# Patient Record
Sex: Female | Born: 1950 | Race: White | Hispanic: No | Smoking: Current every day smoker
Health system: Southern US, Community
[De-identification: ages and names within clinical notes are randomized; demographics above are authoritative.]

## PROBLEM LIST (undated history)

## (undated) DIAGNOSIS — E119 Type 2 diabetes mellitus without complications: Secondary | ICD-10-CM

## (undated) DIAGNOSIS — J189 Pneumonia, unspecified organism: Secondary | ICD-10-CM

## (undated) DIAGNOSIS — O2230 Deep phlebothrombosis in pregnancy, unspecified trimester: Secondary | ICD-10-CM

## (undated) DIAGNOSIS — K5730 Diverticulosis of large intestine without perforation or abscess without bleeding: Secondary | ICD-10-CM

## (undated) DIAGNOSIS — J449 Chronic obstructive pulmonary disease, unspecified: Secondary | ICD-10-CM

## (undated) HISTORY — PX: ABDOMINAL HYSTERECTOMY: SHX81

## (undated) HISTORY — PX: TONSILLECTOMY: SUR1361

## (undated) HISTORY — PX: CYSTECTOMY: SUR359

---

## 2004-10-09 ENCOUNTER — Ambulatory Visit: Payer: Self-pay | Admitting: Obstetrics and Gynecology

## 2007-03-24 ENCOUNTER — Ambulatory Visit: Payer: Self-pay | Admitting: Family Medicine

## 2007-10-05 ENCOUNTER — Ambulatory Visit: Payer: Self-pay | Admitting: Family Medicine

## 2008-05-19 ENCOUNTER — Inpatient Hospital Stay: Payer: Self-pay | Admitting: Internal Medicine

## 2008-08-21 ENCOUNTER — Ambulatory Visit: Payer: Self-pay | Admitting: Family Medicine

## 2009-12-29 ENCOUNTER — Ambulatory Visit: Payer: Self-pay | Admitting: Family Medicine

## 2010-03-19 ENCOUNTER — Ambulatory Visit: Payer: Self-pay | Admitting: Family Medicine

## 2010-10-07 ENCOUNTER — Ambulatory Visit: Payer: Self-pay | Admitting: Family Medicine

## 2010-11-20 ENCOUNTER — Emergency Department: Payer: Self-pay | Admitting: Emergency Medicine

## 2010-11-27 ENCOUNTER — Ambulatory Visit: Payer: Self-pay | Admitting: Family Medicine

## 2011-05-17 ENCOUNTER — Ambulatory Visit: Payer: Self-pay | Admitting: Family Medicine

## 2011-06-30 ENCOUNTER — Ambulatory Visit: Payer: Self-pay | Admitting: Family Medicine

## 2012-01-10 ENCOUNTER — Ambulatory Visit: Payer: Self-pay | Admitting: Specialist

## 2012-01-10 LAB — CREATININE, SERUM
Creatinine: 0.81 mg/dL (ref 0.60–1.30)
EGFR (Non-African Amer.): >60

## 2012-01-18 ENCOUNTER — Ambulatory Visit: Payer: Self-pay | Admitting: Specialist

## 2012-02-09 ENCOUNTER — Ambulatory Visit: Payer: Self-pay | Admitting: Hematology and Oncology

## 2012-03-01 ENCOUNTER — Ambulatory Visit: Payer: Self-pay | Admitting: Hematology and Oncology

## 2012-07-18 ENCOUNTER — Ambulatory Visit: Payer: Self-pay | Admitting: Specialist

## 2012-07-18 LAB — CREATININE, SERUM: EGFR (Non-African Amer.): >60

## 2013-01-29 ENCOUNTER — Emergency Department: Payer: Self-pay | Admitting: Emergency Medicine

## 2013-01-29 LAB — CBC
MCHC: 34.5 g/dL (ref 32.0–36.0)
Platelet: 239 10*3/uL (ref 150–440)
RBC: 4.33 10*6/uL (ref 3.80–5.20)

## 2013-01-29 LAB — COMPREHENSIVE METABOLIC PANEL
Alkaline Phosphatase: 84 U/L
Anion Gap: 5 — ABNORMAL LOW (ref 7–16)
BUN: 14 mg/dL (ref 7–18)
Bilirubin,Total: 0.4 mg/dL (ref 0.2–1.0)
Calcium, Total: 8.8 mg/dL (ref 8.5–10.1)
Co2: 31 mmol/L (ref 21–32)
EGFR (African American): >60
EGFR (Non-African Amer.): >60
Glucose: 102 mg/dL — ABNORMAL HIGH (ref 65–99)
Osmolality: 274 (ref 275–301)
SGOT(AST): 34 U/L (ref 15–37)
SGPT (ALT): 14 U/L (ref 12–78)
Total Protein: 6.9 g/dL (ref 6.4–8.2)

## 2013-01-29 LAB — DRUG SCREEN, URINE
Amphetamines, Ur Screen: NEGATIVE (ref ?–1000)
Benzodiazepine, Ur Scrn: POSITIVE (ref ?–200)
Cocaine Metabolite,Ur ~~LOC~~: NEGATIVE (ref ?–300)
Opiate, Ur Screen: NEGATIVE (ref ?–300)
Phencyclidine (PCP) Ur S: NEGATIVE (ref ?–25)
Tricyclic, Ur Screen: NEGATIVE (ref ?–1000)

## 2013-01-29 LAB — URINALYSIS, COMPLETE
Bilirubin,UR: NEGATIVE
Glucose,UR: NEGATIVE mg/dL (ref 0–75)
Ketone: NEGATIVE
Leukocyte Esterase: NEGATIVE
Ph: 5 (ref 4.5–8.0)
Protein: NEGATIVE
RBC,UR: <1 /HPF (ref 0–5)
Specific Gravity: 1.009 (ref 1.003–1.030)
WBC UR: <1 /HPF (ref 0–5)

## 2013-01-29 LAB — ETHANOL
Ethanol %: <0.003 % (ref 0.000–0.080)
Ethanol: <3 mg/dL

## 2013-01-29 LAB — WET PREP, GENITAL

## 2013-01-29 LAB — SALICYLATE LEVEL: Salicylates, Serum: 3.1 mg/dL — ABNORMAL HIGH

## 2013-01-29 LAB — GC/CHLAMYDIA PROBE AMP

## 2013-02-11 ENCOUNTER — Inpatient Hospital Stay: Payer: Self-pay | Admitting: Psychiatry

## 2013-02-11 LAB — COMPREHENSIVE METABOLIC PANEL
Anion Gap: 6 — ABNORMAL LOW (ref 7–16)
Chloride: 104 mmol/L (ref 98–107)
Co2: 29 mmol/L (ref 21–32)
EGFR (African American): >60
Osmolality: 278 (ref 275–301)
SGOT(AST): 21 U/L (ref 15–37)
Sodium: 139 mmol/L (ref 136–145)
Total Protein: 7.2 g/dL (ref 6.4–8.2)

## 2013-02-11 LAB — URINALYSIS, COMPLETE
Bilirubin,UR: NEGATIVE
Nitrite: NEGATIVE
Ph: 5 (ref 4.5–8.0)
Specific Gravity: 1.011 (ref 1.003–1.030)
Squamous Epithelial: 1

## 2013-02-11 LAB — CBC
HGB: 15.8 g/dL (ref 12.0–16.0)
MCH: 33.3 pg (ref 26.0–34.0)
MCHC: 34.2 g/dL (ref 32.0–36.0)
MCV: 97 fL (ref 80–100)
Platelet: 226 10*3/uL (ref 150–440)
WBC: 7.1 10*3/uL (ref 3.6–11.0)

## 2013-02-11 LAB — DRUG SCREEN, URINE
Barbiturates, Ur Screen: NEGATIVE (ref ?–200)
Benzodiazepine, Ur Scrn: POSITIVE (ref ?–200)
Cocaine Metabolite,Ur ~~LOC~~: NEGATIVE (ref ?–300)
MDMA (Ecstasy)Ur Screen: NEGATIVE (ref ?–500)
Methadone, Ur Screen: NEGATIVE (ref ?–300)
Opiate, Ur Screen: NEGATIVE (ref ?–300)
Tricyclic, Ur Screen: NEGATIVE (ref ?–1000)

## 2013-02-11 LAB — ETHANOL: Ethanol: <3 mg/dL

## 2013-02-11 LAB — ACETAMINOPHEN LEVEL: Acetaminophen: <2 ug/mL

## 2013-02-11 LAB — SALICYLATE LEVEL: Salicylates, Serum: 3.6 mg/dL — ABNORMAL HIGH

## 2013-05-31 ENCOUNTER — Ambulatory Visit: Payer: Self-pay | Admitting: Obstetrics and Gynecology

## 2013-05-31 ENCOUNTER — Ambulatory Visit: Payer: Self-pay | Admitting: Specialist

## 2013-11-16 IMAGING — CT CT CHEST W/ CM
1 series · 15 of 33 positions shown, 19 images · IV contrast (isovue)
Comparison: none

REASON FOR EXAM: LABS 1ST Nodules mediastinal nodes
COMMENTS:

PROCEDURE:     CAROLLE-TATJANA - CAROLLE-TATJANA CHEST WITH CONTRAST  - July 18, 2012 [DATE]
RESULT:     Comparison: CT of the chest 01/10/2012, PET CT 01/18/2012
TECHNIQUE: Multiple axial images of the chest were obtained with 75 mL
Isovue 370 intravenous contrast.

[Series 2: soft tissue · axial · 0.59mm/px · z∈[-789,-513]mm · 15 of 109 slices shown, 19 images]
[im 9/109  mediastinal]
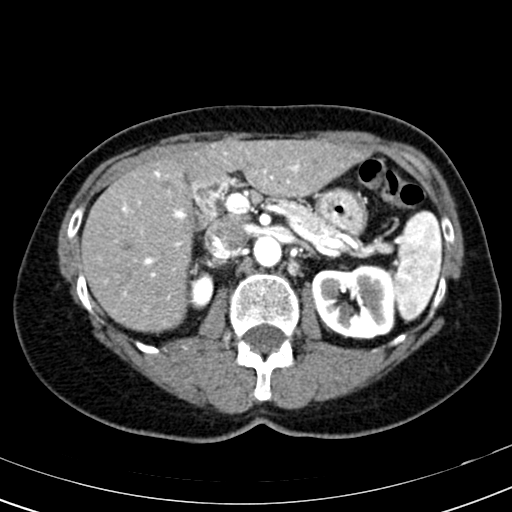
[im 9/109  lung]
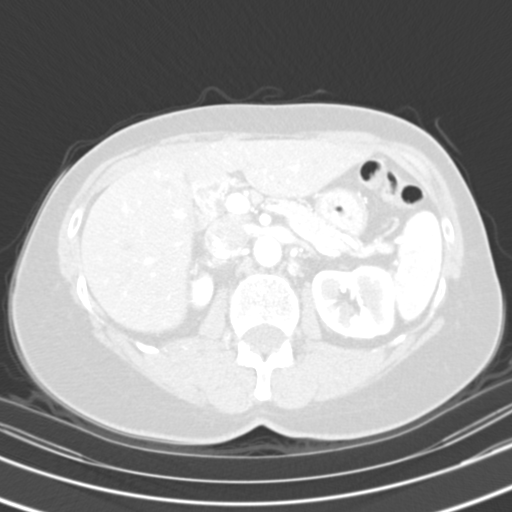
[im 17/109  lung]
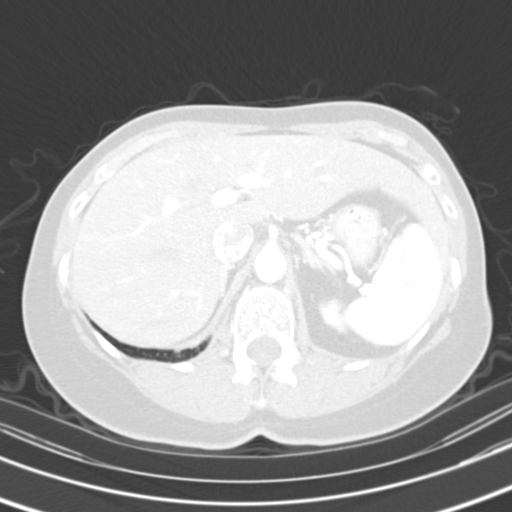
[im 22/109  lung]
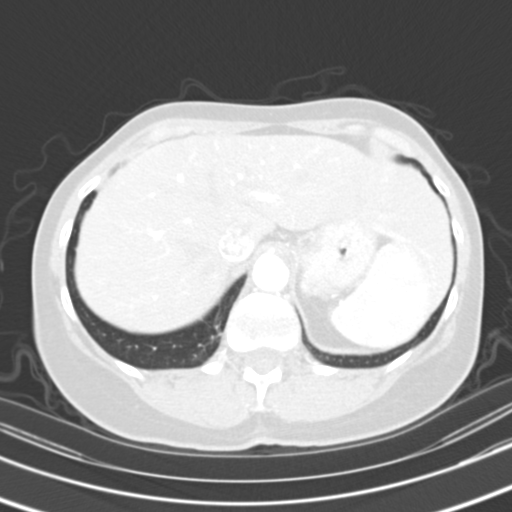
[im 29/109  lung]
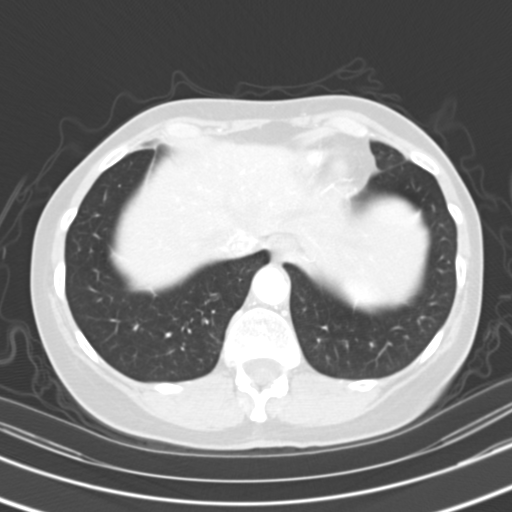
[im 37/109  mediastinal]
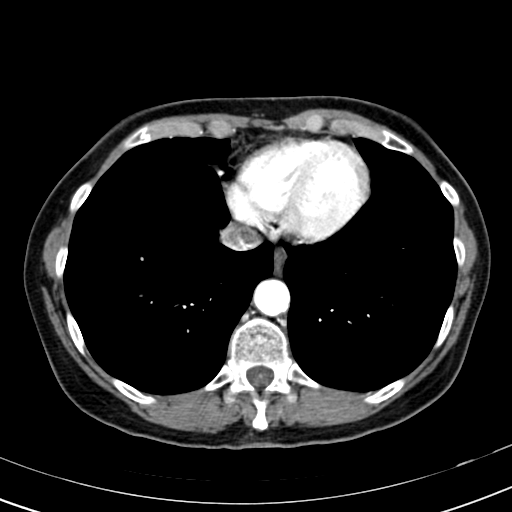
[im 37/109  lung]
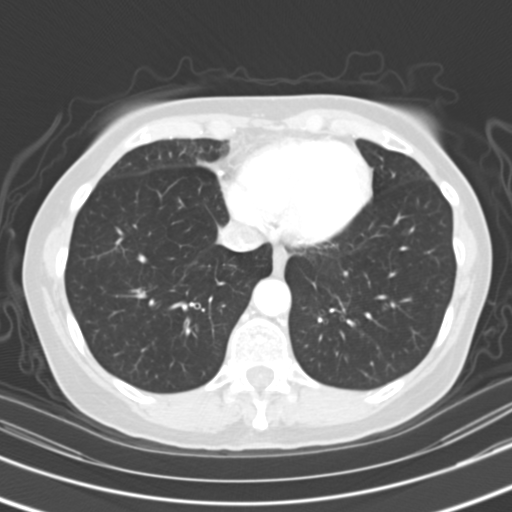
[im 44/109  lung]
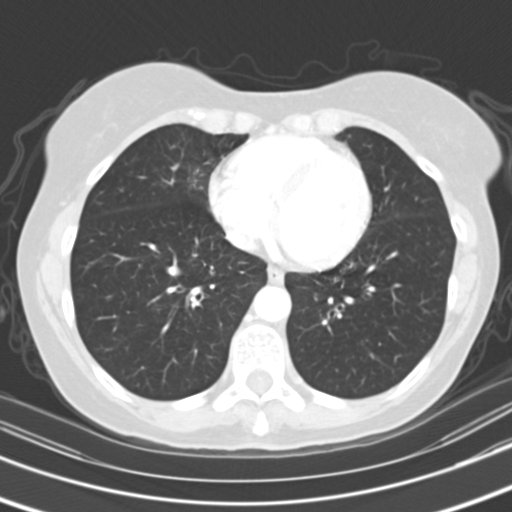
[im 49/109  lung]
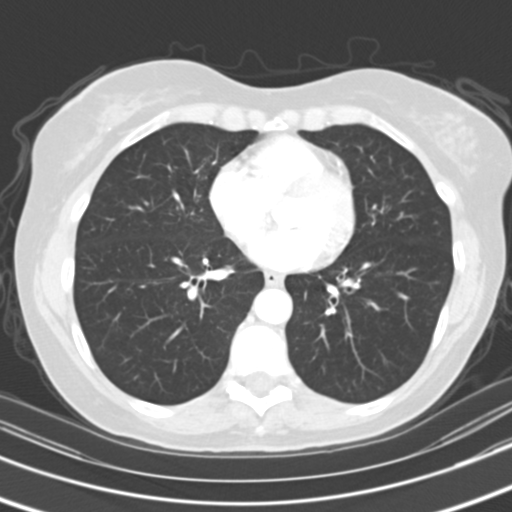
[im 53/109  lung]
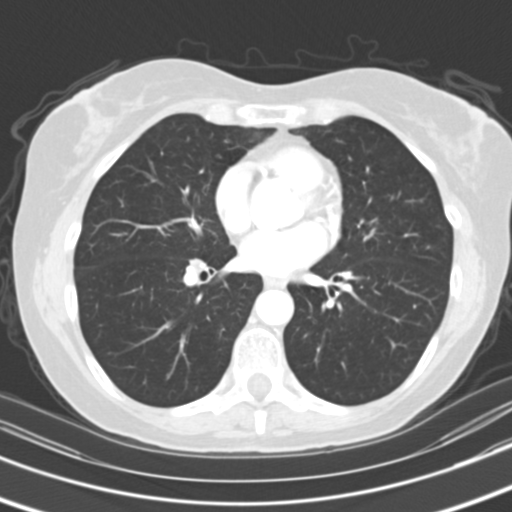
[im 58/109  mediastinal]
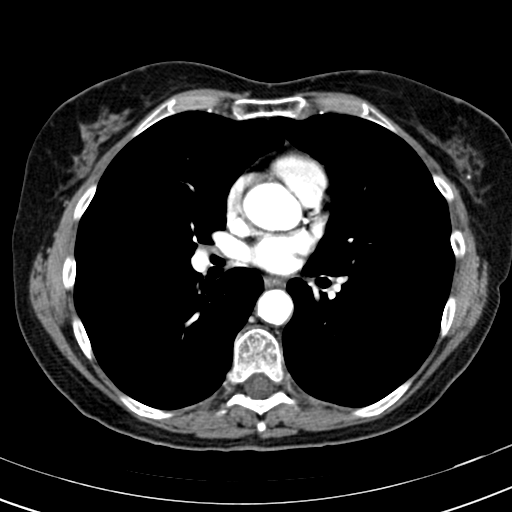
[im 58/109  lung]
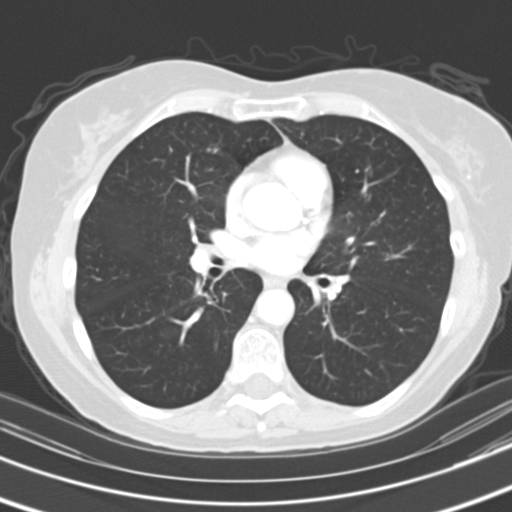
[im 65/109  lung]
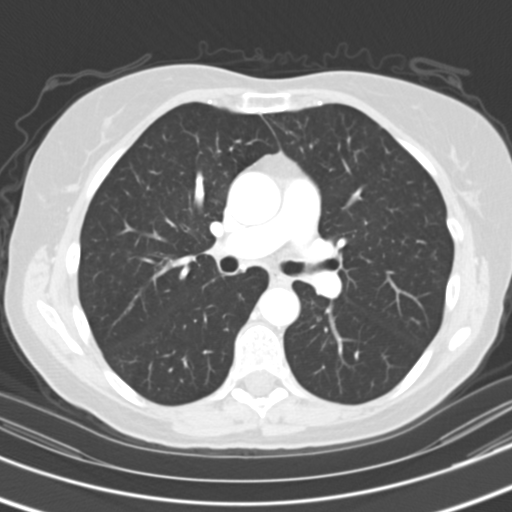
[im 73/109  lung]
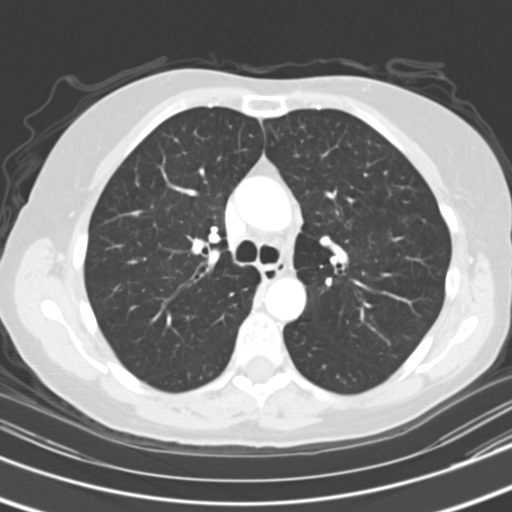
[im 81/109  lung]
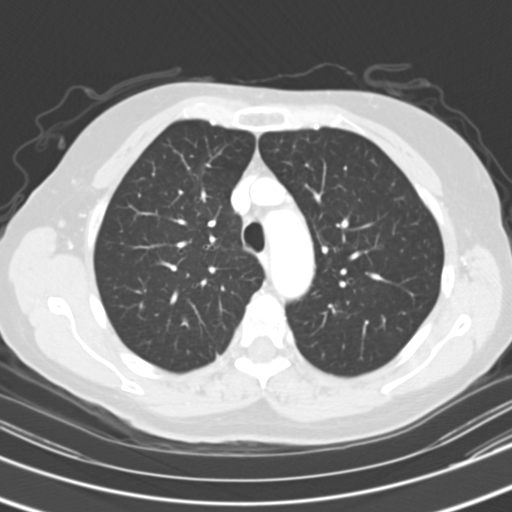
[im 87/109  mediastinal]
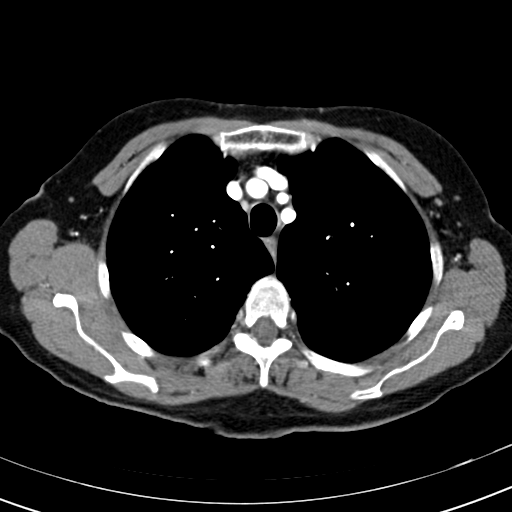
[im 87/109  lung]
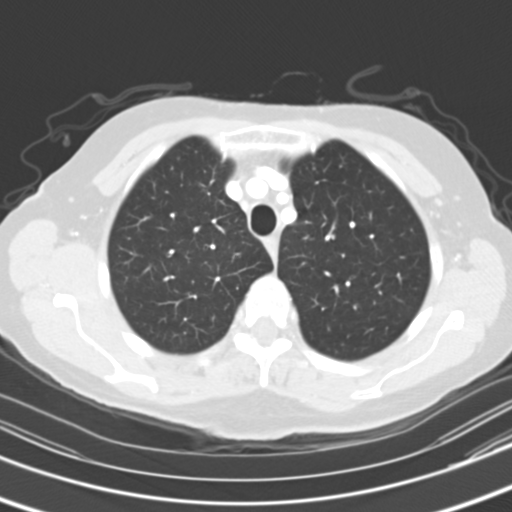
[im 93/109  lung]
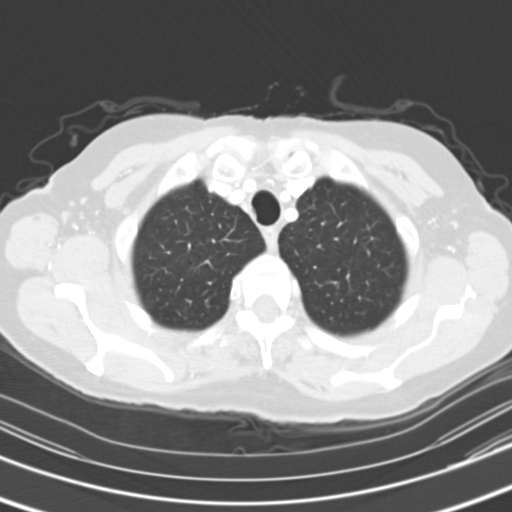
[im 101/109  lung]
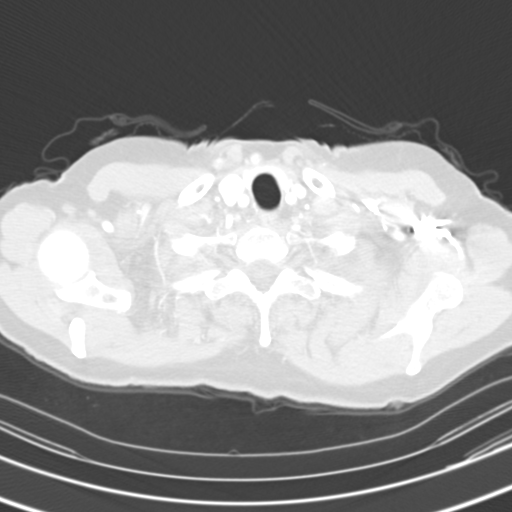

[15 of 33 positions shown; findings below may reference images not displayed]

FINDINGS: Borderline enlarged right paratracheal lymph node near the thoracic inlet is
similar to slightly decreased in conspicuity from prior. It measures
approximately 10-11 mm in diameter. No hilar or axillary lymphadenopathy.
Calcifications are seen in the coronary arteries. There is a 5 mm subpleural
groundglass nodule in the right upper lobe. This appears slightly more
conspicuous than prior, though this is likely related to differences in
slice selection. This is seen on image 30. A 3 mm nodule in the left lower
lobe is similar to prior, image 73.

No aggressive lytic or sclerotic osseous lesions are identified.
IMPRESSION: 1. Borderline enlarged right paratracheal lymph node is similar to slightly
decreased in conspicuity from prior.
2. Indeterminate 5 mm subpleural groundglass nodule in the right upper lobe
is slightly increased in conspicuity from prior, though this is likely
related to differences in slice selection. 3 mm nodule in the left lower
lobe is similar to prior.

Followup noncontrast chest CT is suggested in 6 months for the above.

## 2014-01-13 ENCOUNTER — Emergency Department: Payer: Self-pay | Admitting: Emergency Medicine

## 2014-06-20 ENCOUNTER — Emergency Department
Admit: 2014-06-20 | Disposition: A | Discharge status: Home / Self Care | Payer: Self-pay | Admitting: Emergency Medicine
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
## 2015-04-12 ENCOUNTER — Emergency Department: Payer: Medicare HMO

## 2015-04-12 ENCOUNTER — Emergency Department
Admission: EM | Admit: 2015-04-12 | Discharge: 2015-04-12 | Disposition: A | Discharge status: Home / Self Care | Payer: Medicare HMO | Attending: Emergency Medicine | Admitting: Emergency Medicine

## 2015-04-12 ENCOUNTER — Encounter: Payer: Self-pay | Admitting: Emergency Medicine

## 2015-04-12 DIAGNOSIS — R1030 Lower abdominal pain, unspecified: Secondary | ICD-10-CM | POA: Diagnosis present

## 2015-04-12 DIAGNOSIS — K5732 Diverticulitis of large intestine without perforation or abscess without bleeding: Secondary | ICD-10-CM | POA: Insufficient documentation

## 2015-04-12 DIAGNOSIS — F17200 Nicotine dependence, unspecified, uncomplicated: Secondary | ICD-10-CM | POA: Insufficient documentation

## 2015-04-12 HISTORY — DX: Diverticulosis of large intestine without perforation or abscess without bleeding: K57.30

## 2015-04-12 HISTORY — DX: Chronic obstructive pulmonary disease, unspecified: J44.9

## 2015-04-12 LAB — URINALYSIS COMPLETE WITH MICROSCOPIC (ARMC ONLY)
Bilirubin Urine: NEGATIVE
GLUCOSE, UA: NEGATIVE mg/dL
Ketones, ur: NEGATIVE mg/dL
LEUKOCYTES UA: NEGATIVE
NITRITE: NEGATIVE
PH: 5 (ref 5.0–8.0)
PROTEIN: NEGATIVE mg/dL
SPECIFIC GRAVITY, URINE: 1.008 (ref 1.005–1.030)
WBC UA: NONE SEEN WBC/hpf (ref 0–5)

## 2015-04-12 LAB — COMPREHENSIVE METABOLIC PANEL
ALT: 11 U/L — ABNORMAL LOW (ref 14–54)
ANION GAP: 9 (ref 5–15)
AST: 19 U/L (ref 15–41)
Albumin: 3.7 g/dL (ref 3.5–5.0)
Alkaline Phosphatase: 101 U/L (ref 38–126)
BUN: 9 mg/dL (ref 6–20)
CO2: 27 mmol/L (ref 22–32)
Calcium: 8.8 mg/dL — ABNORMAL LOW (ref 8.9–10.3)
Chloride: 100 mmol/L — ABNORMAL LOW (ref 101–111)
Creatinine, Ser: 0.9 mg/dL (ref 0.44–1.00)
GFR calc Af Amer: >60 mL/min (ref >60)
GFR calc non Af Amer: >60 mL/min (ref >60)
Glucose, Bld: 127 mg/dL — ABNORMAL HIGH (ref 65–99)
POTASSIUM: 4.3 mmol/L (ref 3.5–5.1)
Sodium: 136 mmol/L (ref 135–145)
Total Bilirubin: 0.5 mg/dL (ref 0.3–1.2)
Total Protein: 7.4 g/dL (ref 6.5–8.1)

## 2015-04-12 LAB — CBC WITH DIFFERENTIAL/PLATELET
BASOS ABS: 0.1 10*3/uL (ref 0–0.1)
Basophils Relative: 1 %
Eosinophils Absolute: 0 10*3/uL (ref 0–0.7)
Eosinophils Relative: 1 %
HEMATOCRIT: 45.3 % (ref 35.0–47.0)
Hemoglobin: 15.5 g/dL (ref 12.0–16.0)
LYMPHS ABS: 1.3 10*3/uL (ref 1.0–3.6)
LYMPHS PCT: 15 %
MCH: 33.2 pg (ref 26.0–34.0)
MCHC: 34.2 g/dL (ref 32.0–36.0)
MCV: 97 fL (ref 80.0–100.0)
MONO ABS: 0.5 10*3/uL (ref 0.2–0.9)
Monocytes Relative: 6 %
NEUTROS ABS: 6.8 10*3/uL — AB (ref 1.4–6.5)
Neutrophils Relative %: 77 %
Platelets: 197 10*3/uL (ref 150–440)
RBC: 4.67 MIL/uL (ref 3.80–5.20)
RDW: 12.9 % (ref 11.5–14.5)
WBC: 8.7 10*3/uL (ref 3.6–11.0)

## 2015-04-12 LAB — LIPASE, BLOOD: Lipase: 27 U/L (ref 11–51)

## 2015-04-12 MED ORDER — CIPROFLOXACIN HCL 500 MG PO TABS
500.0000 mg | ORAL_TABLET | Freq: Once | ORAL | Status: AC
  Administered 2015-04-12: 500 mg via ORAL
  Filled 2015-04-12: qty 1

## 2015-04-12 MED ORDER — ONDANSETRON HCL 4 MG/2ML IJ SOLN
4.0000 mg | Freq: Once | INTRAMUSCULAR | Status: AC
  Administered 2015-04-12: 4 mg via INTRAVENOUS
  Filled 2015-04-12: qty 2

## 2015-04-12 MED ORDER — OXYCODONE-ACETAMINOPHEN 5-325 MG PO TABS: 1.0000 | ORAL_TABLET | Freq: Four times a day (QID) | ORAL | Status: DC | PRN

## 2015-04-12 MED ORDER — IOHEXOL 300 MG/ML  SOLN
75.0000 mL | Freq: Once | INTRAMUSCULAR | Status: AC | PRN
  Administered 2015-04-12: 75 mL via INTRAVENOUS

## 2015-04-12 MED ORDER — IOHEXOL 240 MG/ML SOLN
25.0000 mL | Freq: Once | INTRAMUSCULAR | Status: AC | PRN
  Administered 2015-04-12: 25 mL via ORAL

## 2015-04-12 MED ORDER — METRONIDAZOLE 500 MG PO TABS: 500.0000 mg | ORAL_TABLET | Freq: Three times a day (TID) | ORAL | Status: AC

## 2015-04-12 MED ORDER — MORPHINE SULFATE (PF) 4 MG/ML IV SOLN
4.0000 mg | Freq: Once | INTRAVENOUS | Status: AC
  Administered 2015-04-12: 4 mg via INTRAVENOUS
  Filled 2015-04-12: qty 1

## 2015-04-12 MED ORDER — METRONIDAZOLE 500 MG PO TABS
500.0000 mg | ORAL_TABLET | Freq: Once | ORAL | Status: AC
  Administered 2015-04-12: 500 mg via ORAL
  Filled 2015-04-12: qty 1

## 2015-04-12 MED ORDER — SODIUM CHLORIDE 0.9 % IV BOLUS (SEPSIS)
1000.0000 mL | Freq: Once | INTRAVENOUS | Status: AC
Start: 2015-04-12 — End: 2015-04-12
  Administered 2015-04-12: 1000 mL via INTRAVENOUS

## 2015-04-12 MED ORDER — CIPROFLOXACIN HCL 500 MG PO TABS: 500.0000 mg | ORAL_TABLET | Freq: Two times a day (BID) | ORAL | Status: AC

## 2015-04-12 NOTE — ED Notes (Signed)
Esignature not working. Unable to obtain signature. Pt given written and verbal discharge instructions. Verbalized understanding.

## 2015-04-12 NOTE — ED Notes (Signed)
Pt transported to CT scan.

## 2015-04-12 NOTE — ED Provider Notes (Signed)
[facility] Emergency Department Provider Note  Time seen: 8:02 AM  I have reviewed the triage vital signs and the nursing notes.   HISTORY  Chief Complaint Abdominal Pain    HPI Yvette Martinez is a 65 y.o. female with a past medical history of diverticulitis in the past, COPD, who presents the emergency department with abdominal pain. According to the patient for the past 6-7 days she has not had a bowel movement, has had progressively increasing lower abdominal pain and left lower quadrant pain. States it feels like her past episode of diverticulitis. Patient denies any diarrhea, black or bloody stool. Denies any nausea or vomiting or fever. Describes the pain as moderate to severe, mostly located in the lower abdomen to left lower quadrant. Does state this morning she began having burning with urination as well. States the abdominal pain as dull/aching, but will occasionally be sharp and severe.     Past Medical History  Diagnosis Date  . Diverticula of colon   . COPD (chronic obstructive pulmonary disease) (HCC)     There are no active problems to display for this patient.   History reviewed. No pertinent past surgical history.  No current outpatient prescriptions on file.  Allergies Sulfa antibiotics  History reviewed. No pertinent family history.  Social History Social History  Substance Use Topics  . Smoking status: Current Every Day Smoker  . Smokeless tobacco: None  . Alcohol Use: None    Review of Systems Constitutional: Negative for fever. Cardiovascular: Negative for chest pain. Respiratory: Negative for shortness of breath. Gastrointestinal: Lower and left lower quadrant abdominal pain. Negative for nausea, vomiting, diarrhea. Positive constipation. Genitourinary: Positive for dysuria beginning today. Neurological: Negative for headache 10-point ROS otherwise  negative.  ____________________________________________   PHYSICAL EXAM:  VITAL SIGNS: ED Triage Vitals  Enc Vitals Group     BP 04/12/15 0718 138/76 mmHg     Pulse Rate 04/12/15 0718 109     Resp 04/12/15 0718 20     Temp 04/12/15 0718 97.8 F (36.6 C)     Temp Source 04/12/15 0718 Oral     SpO2 04/12/15 0718 92 %     Weight 04/12/15 0718 136 lb (61.689 kg)     Height 04/12/15 0718  (1.575 m)     Head Cir --      Peak Flow --      Pain Score 04/12/15 0718 10     Pain Loc --      Pain Edu? --      Excl. in GC? --     Constitutional: Alert and oriented. Well appearing and in no distress. Eyes: Normal exam ENT   Head: Normocephalic and atraumatic.   Mouth/Throat: Mucous membranes are moist. Cardiovascular: Normal rate, regular rhythm. No murmurs, rubs, or gallops. Respiratory: Normal respiratory effort without tachypnea nor retractions. Breath sounds are clear  Gastrointestinal: Soft, moderate suprapubic and left lower quadrant tenderness to palpation. No rebound or guarding. No distention. Musculoskeletal: Nontender with normal range of motion in all extremities.  Neurologic:  Normal speech and language. No gross focal neurologic deficits Skin:  Skin is warm, dry and intact.  Psychiatric: Mood and affect are normal. Speech and behavior are normal.  ____________________________________________   RADIOLOGY  CT consistent with sigmoid diverticulitis.  ____________________________________________   INITIAL IMPRESSION / ASSESSMENT AND PLAN / ED COURSE  Pertinent labs & imaging results that were available during my care of the patient were reviewed by me and  considered in my medical decision making (see chart for details).  Patient presents to the emergency department with lower abdominal pain and left lower quadrant pain which has been progressively worsening over the past 6-7 days. Patient also states constipation for the same time period. Patient's labs  including CBC, lipase, CMP are within normal limits. Currently awaiting urinalysis results. Given the patient's moderate tenderness to palpation in the left lower quadrant and lower abdomen along with constipation 6 days we will proceed with a CT abdomen/pelvis to further evaluate concern for diverticulitis vs SBO.  CT consistent with sigmoid diverticulitis. Patient states her pain is much better at this time. We'll place the patient on Flagyl and ciprofloxacin, pain medication as needed with primary care follow-up. Patient agreeable to plan. Discussed return precautions.  ____________________________________________   FINAL CLINICAL IMPRESSION(S) / ED DIAGNOSES  Abdominal pain Sigmoid diverticulitis  Minna Antis, MD 04/12/15 1046

## 2015-04-12 NOTE — ED Notes (Signed)
Reports abd pain all week.  Unable to have a BM.  Nausea.

## 2015-04-12 NOTE — Discharge Instructions (Signed)
Diverticulitis  Diverticulitis is when small pockets that have formed in your colon (large intestine) become infected or swollen.  HOME CARE  · Follow your doctor's instructions.  · Follow a special diet if told by your doctor.  · When you feel better, your doctor may tell you to change your diet. You may be told to eat a lot of fiber. Fruits and vegetables are good sources of fiber. Fiber makes it easier to poop (have bowel movements).  · Take supplements or probiotics as told by your doctor.  · Only take medicines as told by your doctor.  · Keep all follow-up visits with your doctor.  GET HELP IF:  · Your pain does not get better.  · You have a hard time eating food.  · You are not pooping like normal.  GET HELP RIGHT AWAY IF:  · Your pain gets worse.  · Your problems do not get better.  · Your problems suddenly get worse.  · You have a fever.  · You keep throwing up (vomiting).  · You have bloody or black, tarry poop (stool).  MAKE SURE YOU:   · Understand these instructions.  · Will watch your condition.  · Will get help right away if you are not doing well or get worse.     This information is not intended to replace advice given to you by your health care provider. Make sure you discuss any questions you have with your health care provider.     Document Released: 08/04/2007 Document Revised: 02/20/2013 Document Reviewed: 01/10/2013  Elsevier Interactive Patient Education ©2016 [employer]

## 2015-04-12 NOTE — ED Notes (Signed)
First IV attempt unsuccessful, pt requested ac placement

## 2015-04-17 ENCOUNTER — Emergency Department: Payer: Medicare HMO

## 2015-04-17 ENCOUNTER — Emergency Department
Admission: EM | Admit: 2015-04-17 | Discharge: 2015-04-17 | Disposition: A | Discharge status: Home / Self Care | Payer: Medicare HMO | Attending: Emergency Medicine | Admitting: Emergency Medicine

## 2015-04-17 ENCOUNTER — Encounter: Payer: Self-pay | Admitting: Emergency Medicine

## 2015-04-17 DIAGNOSIS — R1032 Left lower quadrant pain: Secondary | ICD-10-CM | POA: Diagnosis present

## 2015-04-17 DIAGNOSIS — F17200 Nicotine dependence, unspecified, uncomplicated: Secondary | ICD-10-CM | POA: Insufficient documentation

## 2015-04-17 DIAGNOSIS — K5732 Diverticulitis of large intestine without perforation or abscess without bleeding: Secondary | ICD-10-CM

## 2015-04-17 LAB — CBC WITH DIFFERENTIAL/PLATELET
BASOS ABS: 0.1 10*3/uL (ref 0–0.1)
BASOS PCT: 1 %
EOS ABS: 0 10*3/uL (ref 0–0.7)
EOS PCT: 0 %
HCT: 45.8 % (ref 35.0–47.0)
Hemoglobin: 15.9 g/dL (ref 12.0–16.0)
LYMPHS ABS: 1.2 10*3/uL (ref 1.0–3.6)
Lymphocytes Relative: 11 %
MCH: 33.6 pg (ref 26.0–34.0)
MCHC: 34.6 g/dL (ref 32.0–36.0)
MCV: 97 fL (ref 80.0–100.0)
Monocytes Absolute: 0.5 10*3/uL (ref 0.2–0.9)
Monocytes Relative: 5 %
Neutro Abs: 9 10*3/uL — ABNORMAL HIGH (ref 1.4–6.5)
Neutrophils Relative %: 83 %
PLATELETS: 257 10*3/uL (ref 150–440)
RBC: 4.73 MIL/uL (ref 3.80–5.20)
RDW: 13.3 % (ref 11.5–14.5)
WBC: 10.8 10*3/uL (ref 3.6–11.0)

## 2015-04-17 LAB — URINALYSIS COMPLETE WITH MICROSCOPIC (ARMC ONLY)
BACTERIA UA: NONE SEEN
Glucose, UA: NEGATIVE mg/dL
HGB URINE DIPSTICK: NEGATIVE
Nitrite: NEGATIVE
PH: 5 (ref 5.0–8.0)
PROTEIN: 30 mg/dL — AB
SPECIFIC GRAVITY, URINE: 1.031 — AB (ref 1.005–1.030)

## 2015-04-17 LAB — COMPREHENSIVE METABOLIC PANEL
ALT: 15 U/L (ref 14–54)
AST: 31 U/L (ref 15–41)
Albumin: 3.8 g/dL (ref 3.5–5.0)
Alkaline Phosphatase: 79 U/L (ref 38–126)
Anion gap: 7 (ref 5–15)
BILIRUBIN TOTAL: 0.5 mg/dL (ref 0.3–1.2)
BUN: 14 mg/dL (ref 6–20)
CHLORIDE: 103 mmol/L (ref 101–111)
CO2: 28 mmol/L (ref 22–32)
CREATININE: 1.11 mg/dL — AB (ref 0.44–1.00)
Calcium: 8.9 mg/dL (ref 8.9–10.3)
GFR, EST AFRICAN AMERICAN: 59 mL/min — AB (ref >60)
GFR, EST NON AFRICAN AMERICAN: 51 mL/min — AB (ref >60)
GLUCOSE: 142 mg/dL — AB (ref 65–99)
POTASSIUM: 4.3 mmol/L (ref 3.5–5.1)
Sodium: 138 mmol/L (ref 135–145)
Total Protein: 7.3 g/dL (ref 6.5–8.1)

## 2015-04-17 LAB — LIPASE, BLOOD: Lipase: 27 U/L (ref 11–51)

## 2015-04-17 MED ORDER — ACETAMINOPHEN 500 MG PO TABS
1000.0000 mg | ORAL_TABLET | ORAL | Status: AC
  Administered 2015-04-17: 1000 mg via ORAL
  Filled 2015-04-17: qty 2

## 2015-04-17 MED ORDER — METRONIDAZOLE 500 MG PO TABS: 500.0000 mg | ORAL_TABLET | Freq: Three times a day (TID) | ORAL | Status: DC

## 2015-04-17 MED ORDER — CIPROFLOXACIN HCL 500 MG PO TABS: 500.0000 mg | ORAL_TABLET | Freq: Two times a day (BID) | ORAL | Status: AC

## 2015-04-17 MED ORDER — ONDANSETRON HCL 4 MG/2ML IJ SOLN
4.0000 mg | Freq: Once | INTRAMUSCULAR | Status: AC
  Administered 2015-04-17: 4 mg via INTRAVENOUS
  Filled 2015-04-17: qty 2

## 2015-04-17 MED ORDER — BISACODYL 10 MG RE SUPP: 10.0000 mg | RECTAL | Status: DC | PRN

## 2015-04-17 MED ORDER — POLYETHYLENE GLYCOL 3350 17 G PO PACK
8.5000 g | PACK | Freq: Every day | ORAL | Status: DC
Start: 2015-04-17 — End: 2015-08-23

## 2015-04-17 MED ORDER — IOHEXOL 300 MG/ML  SOLN
75.0000 mL | Freq: Once | INTRAMUSCULAR | Status: AC | PRN
  Administered 2015-04-17: 75 mL via INTRAVENOUS

## 2015-04-17 MED ORDER — SODIUM CHLORIDE 0.9 % IV BOLUS (SEPSIS)
1000.0000 mL | Freq: Once | INTRAVENOUS | Status: AC
  Administered 2015-04-17: 1000 mL via INTRAVENOUS

## 2015-04-17 MED ORDER — IOHEXOL 240 MG/ML SOLN
25.0000 mL | Freq: Once | INTRAMUSCULAR | Status: DC | PRN
  Administered 2015-04-17: 25 mL via ORAL
  Filled 2015-04-17: qty 50

## 2015-04-17 NOTE — ED Notes (Signed)
Pt states she was seen here Saturday for diverticulitis and bowel blockage, given prescription for flagyl and cipro. No BM since 04/05/15, did suppository last night. Taken mineral oil, milk of magnesia. States pee is brown.

## 2015-04-17 NOTE — Discharge Instructions (Signed)
Diverticulitis °Diverticulitis is inflammation or infection of small pouches in your colon that form when you have a condition called diverticulosis. The pouches in your colon are called diverticula. Your colon, or large intestine, is where water is absorbed and stool is formed. °Complications of diverticulitis can include: °· Bleeding. °· Severe infection. °· Severe pain. °· Perforation of your colon. °· Obstruction of your colon. °CAUSES  °Diverticulitis is caused by bacteria. °Diverticulitis happens when stool becomes trapped in diverticula. This allows bacteria to grow in the diverticula, which can lead to inflammation and infection. °RISK FACTORS °People with diverticulosis are at risk for diverticulitis. Eating a diet that does not include enough fiber from fruits and vegetables may make diverticulitis more likely to develop. °SYMPTOMS  °Symptoms of diverticulitis may include: °· Abdominal pain and tenderness. The pain is normally located on the left side of the abdomen, but may occur in other areas. °· Fever and chills. °· Bloating. °· Cramping. °· Nausea. °· Vomiting. °· Constipation. °· Diarrhea. °· Blood in your stool. °DIAGNOSIS  °Your health care provider will ask you about your medical history and do a physical exam. You may need to have tests done because many medical conditions can cause the same symptoms as diverticulitis. Tests may include: °· Blood tests. °· Urine tests. °· Imaging tests of the abdomen, including X-rays and CT scans. °When your condition is under control, your health care provider may recommend that you have a colonoscopy. A colonoscopy can show how severe your diverticula are and whether something else is causing your symptoms. °TREATMENT  °Most cases of diverticulitis are mild and can be treated at home. Treatment may include: °· Taking over-the-counter pain medicines. °· Following a clear liquid diet. °· Taking antibiotic medicines by mouth for 7-10 days. °More severe cases may  be treated at a hospital. Treatment may include: °· Not eating or drinking. °· Taking prescription pain medicine. °· Receiving antibiotic medicines through an IV tube. °· Receiving fluids and nutrition through an IV tube. °· Surgery. °HOME CARE INSTRUCTIONS  °· Follow your health care provider's instructions carefully. °· Follow a full liquid diet or other diet as directed by your health care provider. After your symptoms improve, your health care provider may tell you to change your diet. He or she may recommend you eat a high-fiber diet. Fruits and vegetables are good sources of fiber. Fiber makes it easier to pass stool. °· Take fiber supplements or probiotics as directed by your health care provider. °· Only take medicines as directed by your health care provider. °· Keep all your follow-up appointments. °SEEK MEDICAL CARE IF:  °· Your pain does not improve. °· You have a hard time eating food. °· Your bowel movements do not return to normal. °SEEK IMMEDIATE MEDICAL CARE IF:  °· Your pain becomes worse. °· Your symptoms do not get better. °· Your symptoms suddenly get worse. °· You have a fever. °· You have repeated vomiting. °· You have bloody or black, tarry stools. °MAKE SURE YOU:  °· Understand these instructions. °· Will watch your condition. °· Will get help right away if you are not doing well or get worse. °  °This information is not intended to replace advice given to you by your health care provider. Make sure you discuss any questions you have with your health care provider. °  °Document Released: 11/25/2004 Document Revised: 02/20/2013 Document Reviewed: 01/10/2013 °Elsevier Interactive Patient Education ©2016 [employer] ° °

## 2015-04-17 NOTE — ED Provider Notes (Signed)
[facility] Emergency Department Provider Note  ____________________________________________  Time seen: Approximately 6:06 AM  I have reviewed the triage vital signs and the nursing notes.   HISTORY  Chief Complaint Abdominal Pain and Emesis    HPI Yvette Martinez is a 65 y.o. female history of COPD, diverticulitis.  Patient represents for ongoing pain in the left lower abdomen. She reports she has not had a bowel movement for over a week. She is no longer passing gas for the last few days. No vomiting, but continues to have a sharp full feeling in the left lower abdomen. She reports increasing tenderness in the area as well. No fevers chills. No loose stools.  No chest pain or trouble breathing. She states that she does not wish to be given any pain medicine at this time, though she does report 8 out of 10 pain in the left lower abdomen.  She is no longer passing any flatus.  Past Medical History  Diagnosis Date  . Diverticula of colon   . COPD (chronic obstructive pulmonary disease) (HCC)     There are no active problems to display for this patient.   Past Surgical History  Procedure Laterality Date  . Abdominal hysterectomy      Current Outpatient Rx  Name  Route  Sig  Dispense  Refill  . ciprofloxacin (CIPRO) 500 MG tablet   Oral   Take 1 tablet (500 mg total) by mouth 2 (two) times daily.   28 tablet   0   . metroNIDAZOLE (FLAGYL) 500 MG tablet   Oral   Take 1 tablet (500 mg total) by mouth 3 (three) times daily.   42 tablet   0   . oxyCODONE-acetaminophen (ROXICET) 5-325 MG tablet   Oral   Take 1 tablet by mouth every 6 (six) hours as needed.   20 tablet   0     Allergies Sulfa antibiotics  History reviewed. No pertinent family history.  Social History Social History  Substance Use Topics  . Smoking status: Current Every Day Smoker  . Smokeless tobacco: None  . Alcohol Use: None    Review of  Systems Constitutional: No fever/chills Eyes: No visual changes. ENT: No sore throat. Cardiovascular: Denies chest pain. Respiratory: Denies shortness of breath. Gastrointestinal: No vomiting.  No diarrhea.  No constipation. Genitourinary: Negative for dysuria. Musculoskeletal: Negative for back pain. Skin: Negative for rash. Neurological: Negative for headaches, focal weakness or numbness.  10-point ROS otherwise negative.  ____________________________________________   PHYSICAL EXAM:  VITAL SIGNS: ED Triage Vitals  Enc Vitals Group     BP 04/17/15 0541 151/131 mmHg     Pulse Rate 04/17/15 0541 110     Resp 04/17/15 0541 20     Temp 04/17/15 0541 97.5 F (36.4 C)     Temp Source 04/17/15 0541 Oral     SpO2 04/17/15 0541 93 %     Weight 04/17/15 0541 136 lb (61.689 kg)     Height 04/17/15 0541  (1.575 m)     Head Cir --      Peak Flow --      Pain Score 04/17/15 0539 8     Pain Loc --      Pain Edu? --      Excl. in GC? --    Constitutional: Alert and oriented. Well appearing and in no acute distress. Eyes: Conjunctivae are normal. PERRL. EOMI. Head: Atraumatic. Nose: No congestion/rhinnorhea. Mouth/Throat: Mucous membranes are just slightly  dry.  Oropharynx non-erythematous. Neck: No stridor.   Cardiovascular: Normal rate, regular rhythm. Grossly normal heart sounds.  Good peripheral circulation. Respiratory: Normal respiratory effort.  No retractions. Lungs CTAB. Gastrointestinal: Soft and nontender except for moderate focal tenderness over the left lower quadrant without guarding. No distention.  Musculoskeletal: No lower extremity tenderness nor edema.  No joint effusions. Neurologic:  Normal speech and language. No gross focal neurologic deficits are appreciated. Skin:  Skin is warm, dry and intact. No rash noted. Psychiatric: Mood and affect are normal. Speech and behavior are normal.  ____________________________________________   LABS (all labs  ordered are listed, but only abnormal results are displayed)  Labs Reviewed  COMPREHENSIVE METABOLIC PANEL - Abnormal; Notable for the following:    Glucose, Bld 142 (*)    Creatinine, Ser 1.11 (*)    GFR calc non Af Amer 51 (*)    GFR calc Af Amer 59 (*)    All other components within normal limits  LIPASE, BLOOD  URINALYSIS COMPLETEWITH MICROSCOPIC ([facility] ONLY)  CBC WITH DIFFERENTIAL/PLATELET   ____________________________________________  EKG   ____________________________________________  RADIOLOGY  DG Abd 2 Views (Final result) Result time: 04/17/15 47:82:95   Final result by Rad Results In Interface (04/17/15 62:13:08)   Narrative:   CLINICAL DATA: Left lower quadrant pain. No bowel movement for 1 week. Was seen in ED on Saturday for same symptoms.  EXAM: ABDOMEN - 2 VIEW  COMPARISON: CT abdomen pelvis 04/12/2015  FINDINGS: The colon is mildly dilated and is filled with fluid and gas. Possible colonic wall thickening. Changes likely to represent colitis of nonspecific etiology. No small bowel distention. No free intra-abdominal air. No abnormal air-fluid levels. No radiopaque stones. Visualized bones appear intact.  IMPRESSION: Mildly dilated gas and fluid-filled colon with colonic wall thickening suggesting colitis.   Electronically Signed By: Burman Nieves M.D. On: 04/17/2015 06:38    ____________________________________________   PROCEDURES  Procedure(s) performed: None  Critical Care performed: No  ____________________________________________   INITIAL IMPRESSION / ASSESSMENT AND PLAN / ED COURSE  Pertinent labs & imaging results that were available during my care of the patient were reviewed by me and considered in my medical decision making (see chart for details).  Patient represents ongoing left lower quadrant abdominal pain.  Differential diagnosis includes but is not limited to, abdominal perforation, aortic dissection,  cholecystitis, appendicitis, diverticulitis, colitis, esophagitis/gastritis, kidney stone, pyelonephritis, urinary tract infection, aortic aneurysm. All are considered in decision and treatment plan. Based upon the patient's presentation and risk factors, exam does continue to demonstrate focal left lower quadrant findings. She reports symptoms that are somewhat concerning for obstructive pathology, and she is afebrile. Currently does not wish for any pain medicine.  X-rays obtained which do not demonstrate clear obstruction. Based on the patient's ongoing complaint of the recent diagnosis of diverticulitis, we will repeat CT to further evaluate for evidence of perforation, obstruction, or worsening evidence of colitis/infection.  The patient is hemodynamically stable. Follow-up CT, reeval, urinalysis signed out to Dr. Daryel November times about 7:15 AM. ____________________________________________   FINAL CLINICAL IMPRESSION(S) / ED DIAGNOSES  Final diagnoses:  Abdominal pain, left lower quadrant      Sharyn Creamer, MD 04/17/15 [phone]

## 2015-04-17 NOTE — ED Notes (Signed)
Pt ambulatory to xray.

## 2015-04-17 NOTE — ED Notes (Signed)
MD at bedside.  Dr. Mayford Knife discussing plan of care and test results with patient at this time.

## 2015-04-17 NOTE — ED Notes (Addendum)
Patient ambulatory to triage with steady gait, without difficulty or distress noted; pt reports left lower abd pain with N/V; took 2 advil & xanax PTA without relief; st hx diverticulitis; pt st here Saturday for same and rx flagyl & cipro

## 2015-04-17 NOTE — ED Provider Notes (Signed)
CT abd/pelvis IMPRESSION: Persistent inflammation and wall thickening of the colon at the level of the juncture between the distal descending colon and proximal sigmoid colon. Based on appearance of thickening of the colon at this level on the current study, a neoplasm of the colon cannot be excluded. No free air or focal abscess is identified. Colonic narrowing may now be causing relative obstruction of the colon due to visible dilatation of the rest of the colon proximal to the region of thickening.  I discussed with Dr. Excell Seltzer from general surgery who feels she is stable for outpatient follow-up. She will continue on Cipro and Flagyl, we will use Dulcolax or MiraLAX to help her constipation. Patient agrees with plan, is stable for discharge.  Emily Filbert, MD 04/17/15 [phone]

## 2015-04-18 ENCOUNTER — Ambulatory Visit: Payer: Self-pay | Admitting: General Surgery

## 2015-08-19 ENCOUNTER — Encounter: Payer: Self-pay | Admitting: Emergency Medicine

## 2015-08-19 ENCOUNTER — Inpatient Hospital Stay
Admission: EM | Admit: 2015-08-19 | Discharge: 2015-08-23 | DRG: 190 | Disposition: A | Discharge status: Home Health | Payer: Medicare HMO | Attending: Internal Medicine | Admitting: Internal Medicine

## 2015-08-19 ENCOUNTER — Other Ambulatory Visit: Payer: Self-pay

## 2015-08-19 ENCOUNTER — Emergency Department: Payer: Medicare HMO

## 2015-08-19 DIAGNOSIS — R296 Repeated falls: Secondary | ICD-10-CM | POA: Diagnosis present

## 2015-08-19 DIAGNOSIS — R0902 Hypoxemia: Secondary | ICD-10-CM

## 2015-08-19 DIAGNOSIS — K5720 Diverticulitis of large intestine with perforation and abscess without bleeding: Secondary | ICD-10-CM

## 2015-08-19 DIAGNOSIS — J9601 Acute respiratory failure with hypoxia: Secondary | ICD-10-CM

## 2015-08-19 DIAGNOSIS — J189 Pneumonia, unspecified organism: Secondary | ICD-10-CM | POA: Diagnosis not present

## 2015-08-19 DIAGNOSIS — R197 Diarrhea, unspecified: Secondary | ICD-10-CM

## 2015-08-19 DIAGNOSIS — J440 Chronic obstructive pulmonary disease with acute lower respiratory infection: Secondary | ICD-10-CM | POA: Diagnosis not present

## 2015-08-19 DIAGNOSIS — E871 Hypo-osmolality and hyponatremia: Secondary | ICD-10-CM | POA: Diagnosis present

## 2015-08-19 DIAGNOSIS — K5730 Diverticulosis of large intestine without perforation or abscess without bleeding: Secondary | ICD-10-CM | POA: Diagnosis present

## 2015-08-19 DIAGNOSIS — E860 Dehydration: Secondary | ICD-10-CM | POA: Diagnosis present

## 2015-08-19 DIAGNOSIS — F17200 Nicotine dependence, unspecified, uncomplicated: Secondary | ICD-10-CM | POA: Diagnosis present

## 2015-08-19 DIAGNOSIS — Z720 Tobacco use: Secondary | ICD-10-CM

## 2015-08-19 DIAGNOSIS — F419 Anxiety disorder, unspecified: Secondary | ICD-10-CM | POA: Diagnosis present

## 2015-08-19 DIAGNOSIS — A419 Sepsis, unspecified organism: Secondary | ICD-10-CM

## 2015-08-19 DIAGNOSIS — R1310 Dysphagia, unspecified: Secondary | ICD-10-CM | POA: Diagnosis present

## 2015-08-19 DIAGNOSIS — K5732 Diverticulitis of large intestine without perforation or abscess without bleeding: Secondary | ICD-10-CM | POA: Diagnosis present

## 2015-08-19 DIAGNOSIS — J154 Pneumonia due to other streptococci: Secondary | ICD-10-CM | POA: Diagnosis present

## 2015-08-19 DIAGNOSIS — J441 Chronic obstructive pulmonary disease with (acute) exacerbation: Secondary | ICD-10-CM

## 2015-08-19 DIAGNOSIS — R531 Weakness: Secondary | ICD-10-CM

## 2015-08-19 DIAGNOSIS — Z7951 Long term (current) use of inhaled steroids: Secondary | ICD-10-CM

## 2015-08-19 DIAGNOSIS — K529 Noninfective gastroenteritis and colitis, unspecified: Secondary | ICD-10-CM | POA: Diagnosis present

## 2015-08-19 DIAGNOSIS — K117 Disturbances of salivary secretion: Secondary | ICD-10-CM

## 2015-08-19 DIAGNOSIS — I959 Hypotension, unspecified: Secondary | ICD-10-CM

## 2015-08-19 LAB — CBC
HEMATOCRIT: 39 % (ref 35.0–47.0)
HEMOGLOBIN: 13.5 g/dL (ref 12.0–16.0)
MCH: 33.4 pg (ref 26.0–34.0)
MCHC: 34.5 g/dL (ref 32.0–36.0)
MCV: 96.8 fL (ref 80.0–100.0)
Platelets: 195 10*3/uL (ref 150–440)
RBC: 4.03 MIL/uL (ref 3.80–5.20)
RDW: 13.2 % (ref 11.5–14.5)
WBC: 19.3 10*3/uL — ABNORMAL HIGH (ref 3.6–11.0)

## 2015-08-19 LAB — BLOOD GAS, VENOUS
ACID-BASE EXCESS: 4.1 mmol/L — AB (ref 0.0–3.0)
Bicarbonate: 28.5 mEq/L — ABNORMAL HIGH (ref 21.0–28.0)
O2 SAT: 87.5 %
PATIENT TEMPERATURE: 37
PCO2 VEN: 41 mmHg — AB (ref 44.0–60.0)
PH VEN: 7.45 — AB (ref 7.320–7.430)
pO2, Ven: 51 mmHg — ABNORMAL HIGH (ref 31.0–45.0)

## 2015-08-19 LAB — BASIC METABOLIC PANEL
ANION GAP: 13 (ref 5–15)
BUN: 29 mg/dL — AB (ref 6–20)
CHLORIDE: 95 mmol/L — AB (ref 101–111)
CO2: 26 mmol/L (ref 22–32)
Calcium: 8.7 mg/dL — ABNORMAL LOW (ref 8.9–10.3)
Creatinine, Ser: 0.99 mg/dL (ref 0.44–1.00)
GFR calc Af Amer: >60 mL/min (ref >60)
GFR calc non Af Amer: 58 mL/min — ABNORMAL LOW (ref >60)
GLUCOSE: 137 mg/dL — AB (ref 65–99)
POTASSIUM: 3.6 mmol/L (ref 3.5–5.1)
Sodium: 134 mmol/L — ABNORMAL LOW (ref 135–145)

## 2015-08-19 LAB — HEPATIC FUNCTION PANEL
ALBUMIN: 3.1 g/dL — AB (ref 3.5–5.0)
ALT: 14 U/L (ref 14–54)
AST: 24 U/L (ref 15–41)
Alkaline Phosphatase: 97 U/L (ref 38–126)
BILIRUBIN INDIRECT: 0.6 mg/dL (ref 0.3–0.9)
Bilirubin, Direct: 0.2 mg/dL (ref 0.1–0.5)
TOTAL PROTEIN: 7.6 g/dL (ref 6.5–8.1)
Total Bilirubin: 0.8 mg/dL (ref 0.3–1.2)

## 2015-08-19 LAB — BRAIN NATRIURETIC PEPTIDE: B Natriuretic Peptide: 190 pg/mL — ABNORMAL HIGH (ref 0.0–100.0)

## 2015-08-19 LAB — TROPONIN I: TROPONIN I: 0.03 ng/mL (ref <0.031)

## 2015-08-19 LAB — LIPASE, BLOOD: Lipase: 21 U/L (ref 11–51)

## 2015-08-19 MED ORDER — SODIUM CHLORIDE 0.9 % IV BOLUS (SEPSIS)
1000.0000 mL | Freq: Once | INTRAVENOUS | Status: AC
  Administered 2015-08-19: 1000 mL via INTRAVENOUS

## 2015-08-19 MED ORDER — PIPERACILLIN-TAZOBACTAM 3.375 G IVPB 30 MIN
3.3750 g | Freq: Once | INTRAVENOUS | Status: AC
  Administered 2015-08-19: 3.375 g via INTRAVENOUS
  Filled 2015-08-19: qty 50

## 2015-08-19 MED ORDER — VANCOMYCIN HCL IN DEXTROSE 1-5 GM/200ML-% IV SOLN
1000.0000 mg | Freq: Once | INTRAVENOUS | Status: AC
  Administered 2015-08-20: 1000 mg via INTRAVENOUS
  Filled 2015-08-19: qty 200

## 2015-08-19 MED ORDER — ONDANSETRON HCL 4 MG/2ML IJ SOLN
4.0000 mg | Freq: Once | INTRAMUSCULAR | Status: AC
  Administered 2015-08-19: 4 mg via INTRAVENOUS
  Filled 2015-08-19: qty 2

## 2015-08-19 MED ORDER — SODIUM CHLORIDE 0.9 % IV BOLUS (SEPSIS): 250.0000 mL | Freq: Once | INTRAVENOUS | Status: DC

## 2015-08-19 MED ORDER — DIATRIZOATE MEGLUMINE & SODIUM 66-10 % PO SOLN
15.0000 mL | ORAL | Status: DC
  Administered 2015-08-19: 15 mL via ORAL

## 2015-08-19 MED ORDER — FENTANYL CITRATE (PF) 100 MCG/2ML IJ SOLN
50.0000 ug | Freq: Once | INTRAMUSCULAR | Status: AC
  Administered 2015-08-19: 50 ug via INTRAVENOUS
  Filled 2015-08-19: qty 2

## 2015-08-19 MED ORDER — SODIUM CHLORIDE 0.9 % IV BOLUS (SEPSIS): 500.0000 mL | Freq: Once | INTRAVENOUS | Status: DC

## 2015-08-19 NOTE — ED Notes (Signed)
C/O diarrhea and weakness x 1 week.  Also c/o abdominal cramping and bloating.

## 2015-08-19 NOTE — ED Notes (Signed)
Lab called to inform that the lactic acid sample was hemolyzed and that they needed a new sample.

## 2015-08-19 NOTE — ED Notes (Addendum)
Pt c/o increased weakness x1week, pt hx of copd but c/o increased SOB , denies chronic o2 use, pt 88%  Put on 1L [place]. States she has been running slight fevers. Pt spouse states she has been less ambulatory x several months and has increased falls the past week. Pt A&O but states she feels "foggy"

## 2015-08-19 NOTE — ED Notes (Addendum)
Pt husband Molly MaduroRobert [phone]

## 2015-08-19 NOTE — ED Provider Notes (Signed)
[facility] Emergency Department Provider Note  ____________________________________________  Time seen: Approximately 10:41 PM  I have reviewed the triage vital signs and the nursing notes.   HISTORY  Chief Complaint Weakness and Diarrhea    HPI Yvette Martinez is a 65 y.o. female with a history of COPD not on oxygen, diverticulitis, presenting with diarrhea, left lower quadrant abdominal pain, generalized weakness, and cough with shortness of breath. The patient reports one week of once daily loose nonbloody stool associated with left lower quadrant pain similar to previous diverticulitis. She has had nausea without vomiting. She also reports an increasingly worsening cough productive of clear white sputum with associated shortness of breath. She denies any chest pain, palpitations, lightheadedness or syncope. She does not have any sick contacts and has not traveled outside of the [place]. She reports significant decrease in liquid and solid by mouth intake. Oh fevers or chills, no dysuria.   Past Medical History  Diagnosis Date  . Diverticula of colon   . COPD (chronic obstructive pulmonary disease) (HCC)     There are no active problems to display for this patient.   Past Surgical History  Procedure Laterality Date  . Abdominal hysterectomy      Current Outpatient Rx  Name  Route  Sig  Dispense  Refill  . ALPRAZolam (XANAX) 1 MG tablet   Oral   Take 1 tablet by mouth every 6 (six) hours as needed for anxiety.          . Fluticasone-Salmeterol (ADVAIR DISKUS) 100-50 MCG/DOSE AEPB   Inhalation   Inhale 1 puff into the lungs 2 (two) times daily.         . mirtazapine (REMERON) 30 MG tablet   Oral   Take 30 mg by mouth 2 (two) times daily.          Marland Kitchen. tiotropium (SPIRIVA) 18 MCG inhalation capsule   Inhalation   Place 1 capsule into inhaler and inhale daily.         . VENTOLIN HFA 108 (90 Base) MCG/ACT inhaler   Inhalation    Inhale 1-2 puffs into the lungs every 6 (six) hours as needed.            Dispense as written.   . bisacodyl (DULCOLAX) 10 MG suppository   Rectal   Place 1 suppository (10 mg total) rectally as needed for moderate constipation. Patient not taking: Reported on 08/19/2015   12 suppository   0   . metroNIDAZOLE (FLAGYL) 500 MG tablet   Oral   Take 1 tablet (500 mg total) by mouth 3 (three) times daily. Patient not taking: Reported on 08/19/2015   14 tablet   0   . oxyCODONE-acetaminophen (ROXICET) 5-325 MG tablet   Oral   Take 1 tablet by mouth every 6 (six) hours as needed. Patient not taking: Reported on 08/19/2015   20 tablet   0   . polyethylene glycol (MIRALAX / GLYCOLAX) packet   Oral   Take 8.5 g by mouth daily. Patient not taking: Reported on 08/19/2015   14 each   0     Allergies Sulfa antibiotics  No family history on file.  Social History Social History  Substance Use Topics  . Smoking status: Current Every Day Smoker  . Smokeless tobacco: None  . Alcohol Use: None    Review of Systems Constitutional: No fever/chills.No lightheadedness or syncope. Positive general malaise and weakness. Eyes: No visual changes. No eye discharge. ENT: No  sore throat. No congestion or rhinorrhea. Cardiovascular: Denies chest pain. Denies palpitations. Respiratory: Positive shortness of breath.  Positive cough. Gastrointestinal: Positive abdominal pain.  Positive nausea, no vomiting.  Positive diarrhea.  No constipation. Genitourinary: Negative for dysuria. Musculoskeletal: Negative for back pain. Skin: Negative for rash. Neurological: Negative for headaches. No focal numbness, tingling or weakness.   10-point ROS otherwise negative.  ____________________________________________   PHYSICAL EXAM:  VITAL SIGNS: ED Triage Vitals  Enc Vitals Group     BP 08/19/15 2151 116/75 mmHg     Pulse Rate 08/19/15 2151 98     Resp 08/19/15 2151 16     Temp 08/19/15 2151  98.4 F (36.9 C)     Temp Source 08/19/15 2151 Oral     SpO2 08/19/15 2151 87 %     Weight 08/19/15 2151 125 lb (56.7 kg)     Height 08/19/15 2151 5\' 2"  (1.575 m)     Head Cir --      Peak Flow --      Pain Score 08/19/15 2152 9     Pain Loc --      Pain Edu? --      Excl. in GC? --     Constitutional: Patient is alert and oriented and able to answer questions appropriately. She is chronically ill-appearing, appears tired, but nontoxic.  Eyes: Conjunctivae are normal.  EOMI. No scleral icterus. No eye discharge. Head: Atraumatic. Nose: No congestion/rhinnorhea. Mouth/Throat: Mucous membranes are dry.  Neck: No stridor.  Supple.  No JVD. No meningismus. Cardiovascular: Normal rate, regular rhythm. No murmurs, rubs or gallops.  Respiratory: Mild tachypnea with minimal accessory muscle use. No retractions. Rales in the left Ace, without any other wheezes or rhonchi. Good air exchange. The patient's oxygenation on room air is 87% and comes up to 95% on 3 L. Gastrointestinal: Soft and mildly distended.  Diffuse tenderness to palpation most prominent in the left lower quadrant . No guarding or rebound.  No peritoneal signs. Musculoskeletal: No LE edema. No ttp in the calves or palpable cords.  Negative Homan's sign. Neurologic:  A&Ox3.  Speech is clear.  Face and smile are symmetric.  EOMI.  Moves all extremities well. Skin:  Skin is warm, dry with poor skin turgor. Psychiatric: Depressed mood and flat affect. Speech and behavior are normal.  Normal judgement.  ____________________________________________   LABS (all labs ordered are listed, but only abnormal results are displayed)  Labs Reviewed  BASIC METABOLIC PANEL - Abnormal; Notable for the following:    Sodium 134 (*)    Chloride 95 (*)    Glucose, Bld 137 (*)    BUN 29 (*)    Calcium 8.7 (*)    GFR calc non Af Amer 58 (*)    All other components within normal limits  CBC - Abnormal; Notable for the following:    WBC 19.3  (*)    All other components within normal limits  HEPATIC FUNCTION PANEL - Abnormal; Notable for the following:    Albumin 3.1 (*)    All other components within normal limits  BLOOD GAS, VENOUS - Abnormal; Notable for the following:    pH, Ven 7.45 (*)    pCO2, Ven 41 (*)    pO2, Ven 51.0 (*)    Bicarbonate 28.5 (*)    Acid-Base Excess 4.1 (*)    All other components within normal limits  CULTURE, BLOOD (ROUTINE X 2)  CULTURE, BLOOD (ROUTINE X 2)  URINE CULTURE  CULTURE,  EXPECTORATED SPUTUM-ASSESSMENT  C DIFFICILE QUICK SCREEN W PCR REFLEX  GASTROINTESTINAL PANEL BY PCR, STOOL (REPLACES STOOL CULTURE)  LIPASE, BLOOD  TROPONIN I  URINALYSIS COMPLETEWITH MICROSCOPIC ([facility] ONLY)  LACTIC ACID, PLASMA  BRAIN NATRIURETIC PEPTIDE  CBG MONITORING, ED   ____________________________________________  EKG  ED ECG REPORT I, Rockne Menghini, the attending physician, personally viewed and interpreted this ECG.   Date: 08/19/2015  EKG Time: 2157  Rate: 94  Rhythm: normal sinus rhythm  Axis: normal  Intervals:none  ST&T Change: Nonspecific T-wave inversion in V1. No STEMI  ____________________________________________  RADIOLOGY  Dg Chest 2 View  08/19/2015  CLINICAL DATA:  Acute onset of generalized weakness. Shortness of breath and cough. Decreased O2 saturation. Initial encounter. EXAM: CHEST  2 VIEW COMPARISON:  Chest radiograph from 06/20/2014 FINDINGS: A small left pleural effusion is noted. Bibasilar airspace opacities may reflect pneumonia or mild interstitial edema. Vascular congestion is noted. No pneumothorax is seen. The heart remains normal in size. No acute osseous abnormalities are identified. IMPRESSION: Small left pleural effusion noted. Bibasilar airspace opacities may reflect pneumonia or mild interstitial edema. Vascular congestion noted. Electronically Signed   By: Roanna Raider M.D.   On: 08/19/2015 23:17     ____________________________________________   PROCEDURES  Procedure(s) performed: None  Critical Care performed: Yes ____________________________________________   INITIAL IMPRESSION / ASSESSMENT AND PLAN / ED COURSE  Pertinent labs & imaging results that were available during my care of the patient were reviewed by me and considered in my medical decision making (see chart for details).  65 y.o. female with 1 week of loose stool and abdominal pain in the left lower quadrant, as well as new onset hypoxia with cough and shortness of breath. Overall, the patient is hypotensive with a blood pressure of 92/75 on my exam. She is hypoxic. I'm concerned about sepsis, with either an intra-abdominal or pulmonary source or possibly even both. We will get a CT scan of the abdomen (consider diverticulitis or diverticulitis with complications), and a chest x-ray and initiate fluid resuscitation as well as IV antibiotics immediately. We'll also treat the patient symptomatically and get cultures as well as labs. She will require admission to the hospital.  CRITICAL CARE Performed by: Rockne Menghini   Total critical care time: 35 minutes  Critical care time was exclusive of separately billable procedures and treating other patients.  Critical care was necessary to treat or prevent imminent or life-threatening deterioration.  Critical care was time spent personally by me on the following activities: development of treatment plan with patient and/or surrogate as well as nursing, discussions with consultants, evaluation of patient's response to treatment, examination of patient, obtaining history from patient or surrogate, ordering and performing treatments and interventions, ordering and review of laboratory studies, ordering and review of radiographic studies, pulse oximetry and re-evaluation of patient's condition.  ----------------------------------------- 11:26 PM on  08/19/2015 -----------------------------------------  Patient does have a pleural effusion on her chest x-ray. She has findings that are likely due to pneumonia, and has been treated empirically for this. We're waiting the results of her CT scan. She's been signed out to Dr. Inocencio Homes, who will follow the results of her CT abdomen, for final disposition.  ____________________________________________  FINAL CLINICAL IMPRESSION(S) / ED DIAGNOSES  Final diagnoses:  Sepsis, due to unspecified organism [facility])  Community acquired pneumonia  Hypoxia  Hypotension, unspecified hypotension type      NEW MEDICATIONS STARTED DURING THIS VISIT:  New Prescriptions   No medications on file  Rockne Menghini, MD 08/19/15 [phone]

## 2015-08-19 NOTE — Progress Notes (Signed)
Pharmacy Antibiotic Note  Yvette Martinez is a 65 y.o. female admitted on 08/19/2015 with sepsis.  Pharmacy has been consulted for Zosyn and vancomycin dosing.  Plan: 1. Zosyn 3.375 gm IV Q8H EI 2. Vancomycin 1 gm IV Q24H, predicted trough 17 mcg/mL. Pharmacy will continue to follow and adjust as needed to maintain trough 15 to 20 mcg/mL.   Vd 36.7 L, Ke 0.04 hr-1, T1/2 17 hr  Height: 5\' 2"  (157.5 cm) Weight: 125 lb (56.7 kg) IBW/kg (Calculated) : 50.1  Temp (24hrs), Avg:98.4 F (36.9 C), Min:98.4 F (36.9 C), Max:98.4 F (36.9 C)   Recent Labs Lab 08/19/15 2155  WBC 19.3*  CREATININE 0.99    Estimated Creatinine Clearance: 44.2 mL/min (by C-G formula based on Cr of 0.99).    Allergies  Allergen Reactions  . Sulfa Antibiotics Hives    Thank you for allowing pharmacy to be a part of this patient's care.  Carola FrostNathan A Caster Fayette, Pharm.D., BCPS Clinical Pharmacist 08/19/2015 10:53 PM

## 2015-08-19 NOTE — ED Notes (Addendum)
Patient transported to X-ray 

## 2015-08-20 ENCOUNTER — Inpatient Hospital Stay: Payer: Medicare HMO

## 2015-08-20 ENCOUNTER — Encounter: Payer: Self-pay | Admitting: Radiology

## 2015-08-20 ENCOUNTER — Emergency Department: Payer: Medicare HMO

## 2015-08-20 DIAGNOSIS — J440 Chronic obstructive pulmonary disease with acute lower respiratory infection: Secondary | ICD-10-CM | POA: Diagnosis present

## 2015-08-20 DIAGNOSIS — I959 Hypotension, unspecified: Secondary | ICD-10-CM | POA: Diagnosis present

## 2015-08-20 DIAGNOSIS — J189 Pneumonia, unspecified organism: Secondary | ICD-10-CM | POA: Diagnosis present

## 2015-08-20 DIAGNOSIS — E871 Hypo-osmolality and hyponatremia: Secondary | ICD-10-CM | POA: Diagnosis present

## 2015-08-20 DIAGNOSIS — F419 Anxiety disorder, unspecified: Secondary | ICD-10-CM | POA: Diagnosis present

## 2015-08-20 DIAGNOSIS — K529 Noninfective gastroenteritis and colitis, unspecified: Secondary | ICD-10-CM | POA: Diagnosis present

## 2015-08-20 DIAGNOSIS — Z7951 Long term (current) use of inhaled steroids: Secondary | ICD-10-CM | POA: Diagnosis not present

## 2015-08-20 DIAGNOSIS — K5732 Diverticulitis of large intestine without perforation or abscess without bleeding: Secondary | ICD-10-CM | POA: Diagnosis present

## 2015-08-20 DIAGNOSIS — R296 Repeated falls: Secondary | ICD-10-CM | POA: Diagnosis present

## 2015-08-20 DIAGNOSIS — E860 Dehydration: Secondary | ICD-10-CM | POA: Diagnosis present

## 2015-08-20 DIAGNOSIS — K5730 Diverticulosis of large intestine without perforation or abscess without bleeding: Secondary | ICD-10-CM | POA: Diagnosis present

## 2015-08-20 DIAGNOSIS — F17200 Nicotine dependence, unspecified, uncomplicated: Secondary | ICD-10-CM | POA: Diagnosis present

## 2015-08-20 DIAGNOSIS — R1310 Dysphagia, unspecified: Secondary | ICD-10-CM | POA: Diagnosis present

## 2015-08-20 DIAGNOSIS — J441 Chronic obstructive pulmonary disease with (acute) exacerbation: Secondary | ICD-10-CM | POA: Diagnosis present

## 2015-08-20 DIAGNOSIS — J9601 Acute respiratory failure with hypoxia: Secondary | ICD-10-CM | POA: Diagnosis present

## 2015-08-20 DIAGNOSIS — J154 Pneumonia due to other streptococci: Secondary | ICD-10-CM | POA: Diagnosis present

## 2015-08-20 LAB — URINALYSIS COMPLETE WITH MICROSCOPIC (ARMC ONLY)
BACTERIA UA: NONE SEEN
Bilirubin Urine: NEGATIVE
Glucose, UA: NEGATIVE mg/dL
Ketones, ur: NEGATIVE mg/dL
Leukocytes, UA: NEGATIVE
NITRITE: NEGATIVE
PROTEIN: NEGATIVE mg/dL
SPECIFIC GRAVITY, URINE: 1.04 — AB (ref 1.005–1.030)
pH: 5 (ref 5.0–8.0)

## 2015-08-20 LAB — GASTROINTESTINAL PANEL BY PCR, STOOL (REPLACES STOOL CULTURE)
ADENOVIRUS F40/41: NOT DETECTED
ASTROVIRUS: NOT DETECTED
CYCLOSPORA CAYETANENSIS: NOT DETECTED
Campylobacter species: NOT DETECTED
Cryptosporidium: NOT DETECTED
E. COLI O157: NOT DETECTED
ENTAMOEBA HISTOLYTICA: NOT DETECTED
ENTEROAGGREGATIVE E COLI (EAEC): NOT DETECTED
ENTEROPATHOGENIC E COLI (EPEC): NOT DETECTED
ENTEROTOXIGENIC E COLI (ETEC): NOT DETECTED
Giardia lamblia: NOT DETECTED
NOROVIRUS GI/GII: NOT DETECTED
Plesimonas shigelloides: NOT DETECTED
Rotavirus A: NOT DETECTED
SAPOVIRUS (I, II, IV, AND V): NOT DETECTED
Salmonella species: NOT DETECTED
Shiga like toxin producing E coli (STEC): NOT DETECTED
Shigella/Enteroinvasive E coli (EIEC): NOT DETECTED
VIBRIO CHOLERAE: NOT DETECTED
VIBRIO SPECIES: NOT DETECTED
Yersinia enterocolitica: NOT DETECTED

## 2015-08-20 LAB — GLUCOSE, CAPILLARY: Glucose-Capillary: 79 mg/dL (ref 65–99)

## 2015-08-20 LAB — LACTIC ACID, PLASMA: LACTIC ACID, VENOUS: 1.5 mmol/L (ref 0.5–2.0)

## 2015-08-20 LAB — EXPECTORATED SPUTUM ASSESSMENT W REFEX TO RESP CULTURE

## 2015-08-20 LAB — EXPECTORATED SPUTUM ASSESSMENT W GRAM STAIN, RFLX TO RESP C

## 2015-08-20 LAB — STREP PNEUMONIAE URINARY ANTIGEN: Strep Pneumo Urinary Antigen: NEGATIVE

## 2015-08-20 LAB — C DIFFICILE QUICK SCREEN W PCR REFLEX
C DIFFICILE (CDIFF) INTERP: NEGATIVE
C DIFFICILE (CDIFF) TOXIN: NEGATIVE
C DIFFICLE (CDIFF) ANTIGEN: NEGATIVE

## 2015-08-20 MED ORDER — ACETAMINOPHEN 325 MG PO TABS: 650.0000 mg | ORAL_TABLET | Freq: Four times a day (QID) | ORAL | Status: DC | PRN

## 2015-08-20 MED ORDER — CETYLPYRIDINIUM CHLORIDE 0.05 % MT LIQD
7.0000 mL | Freq: Two times a day (BID) | OROMUCOSAL | Status: DC
  Administered 2015-08-20 – 2015-08-23 (×7): 7 mL via OROMUCOSAL

## 2015-08-20 MED ORDER — ONDANSETRON HCL 4 MG/2ML IJ SOLN
4.0000 mg | Freq: Four times a day (QID) | INTRAMUSCULAR | Status: DC | PRN
  Administered 2015-08-22: 4 mg via INTRAVENOUS
  Filled 2015-08-20: qty 2

## 2015-08-20 MED ORDER — LEVOFLOXACIN IN D5W 750 MG/150ML IV SOLN
750.0000 mg | INTRAVENOUS | Status: DC
  Administered 2015-08-20: 750 mg via INTRAVENOUS
  Filled 2015-08-20: qty 150

## 2015-08-20 MED ORDER — AZITHROMYCIN 500 MG PO TABS: 500.0000 mg | ORAL_TABLET | ORAL | Status: DC

## 2015-08-20 MED ORDER — DEXTROSE 5 % IV SOLN: 1.0000 g | INTRAVENOUS | Status: DC

## 2015-08-20 MED ORDER — METRONIDAZOLE IN NACL 5-0.79 MG/ML-% IV SOLN
500.0000 mg | Freq: Three times a day (TID) | INTRAVENOUS | Status: DC
  Administered 2015-08-20 – 2015-08-22 (×7): 500 mg via INTRAVENOUS
  Filled 2015-08-20 (×9): qty 100

## 2015-08-20 MED ORDER — IOPAMIDOL (ISOVUE-370) INJECTION 76%
100.0000 mL | Freq: Once | INTRAVENOUS | Status: AC | PRN
  Administered 2015-08-20: 100 mL via INTRAVENOUS

## 2015-08-20 MED ORDER — IPRATROPIUM-ALBUTEROL 0.5-2.5 (3) MG/3ML IN SOLN
3.0000 mL | Freq: Four times a day (QID) | RESPIRATORY_TRACT | Status: DC
  Administered 2015-08-20 – 2015-08-23 (×9): 3 mL via RESPIRATORY_TRACT
  Filled 2015-08-20 (×11): qty 3

## 2015-08-20 MED ORDER — MIRTAZAPINE 15 MG PO TABS
30.0000 mg | ORAL_TABLET | Freq: Two times a day (BID) | ORAL | Status: DC
  Administered 2015-08-20 – 2015-08-23 (×4): 30 mg via ORAL
  Filled 2015-08-20 (×5): qty 2

## 2015-08-20 MED ORDER — MORPHINE SULFATE (PF) 2 MG/ML IV SOLN
1.0000 mg | INTRAVENOUS | Status: DC | PRN
  Administered 2015-08-20 – 2015-08-21 (×2): 1 mg via INTRAVENOUS
  Filled 2015-08-20 (×2): qty 1

## 2015-08-20 MED ORDER — POLYETHYLENE GLYCOL 3350 17 G PO PACK
8.5000 g | PACK | Freq: Every day | ORAL | Status: DC
  Administered 2015-08-20 – 2015-08-22 (×2): 8.5 g via ORAL
  Filled 2015-08-20 (×3): qty 1

## 2015-08-20 MED ORDER — ALBUTEROL SULFATE (2.5 MG/3ML) 0.083% IN NEBU
2.5000 mg | INHALATION_SOLUTION | RESPIRATORY_TRACT | Status: DC | PRN
  Administered 2015-08-20: 2.5 mg via RESPIRATORY_TRACT
  Filled 2015-08-20: qty 3

## 2015-08-20 MED ORDER — ONDANSETRON HCL 4 MG PO TABS: 4.0000 mg | ORAL_TABLET | Freq: Four times a day (QID) | ORAL | Status: DC | PRN

## 2015-08-20 MED ORDER — ENSURE ENLIVE PO LIQD
237.0000 mL | Freq: Two times a day (BID) | ORAL | Status: DC
  Administered 2015-08-21 – 2015-08-23 (×4): 237 mL via ORAL

## 2015-08-20 MED ORDER — BISACODYL 10 MG RE SUPP: 10.0000 mg | RECTAL | Status: DC | PRN

## 2015-08-20 MED ORDER — OXYCODONE-ACETAMINOPHEN 5-325 MG PO TABS: 1.0000 | ORAL_TABLET | Freq: Four times a day (QID) | ORAL | Status: DC | PRN

## 2015-08-20 MED ORDER — BUDESONIDE 0.25 MG/2ML IN SUSP
0.2500 mg | Freq: Two times a day (BID) | RESPIRATORY_TRACT | Status: DC
  Administered 2015-08-20 – 2015-08-23 (×6): 0.25 mg via RESPIRATORY_TRACT
  Filled 2015-08-20 (×7): qty 2

## 2015-08-20 MED ORDER — SODIUM CHLORIDE 0.9 % IV SOLN
INTRAVENOUS | Status: DC
  Administered 2015-08-20: 05:00:00 via INTRAVENOUS

## 2015-08-20 MED ORDER — ENOXAPARIN SODIUM 40 MG/0.4ML ~~LOC~~ SOLN: 40.0000 mg | SUBCUTANEOUS | Status: DC

## 2015-08-20 MED ORDER — ALPRAZOLAM 0.5 MG PO TABS
1.0000 mg | ORAL_TABLET | Freq: Four times a day (QID) | ORAL | Status: DC | PRN
  Administered 2015-08-21 – 2015-08-23 (×6): 1 mg via ORAL
  Filled 2015-08-20 (×6): qty 2

## 2015-08-20 MED ORDER — NICOTINE 21 MG/24HR TD PT24
21.0000 mg | MEDICATED_PATCH | Freq: Every day | TRANSDERMAL | Status: DC
  Administered 2015-08-20 – 2015-08-23 (×4): 21 mg via TRANSDERMAL
  Filled 2015-08-20 (×4): qty 1

## 2015-08-20 MED ORDER — ACETAMINOPHEN 650 MG RE SUPP
650.0000 mg | Freq: Four times a day (QID) | RECTAL | Status: DC | PRN
  Filled 2015-08-20: qty 1

## 2015-08-20 MED ORDER — ENOXAPARIN SODIUM 40 MG/0.4ML ~~LOC~~ SOLN
40.0000 mg | Freq: Every day | SUBCUTANEOUS | Status: DC
  Administered 2015-08-20: 40 mg via SUBCUTANEOUS
  Filled 2015-08-20: qty 0.4

## 2015-08-20 MED ORDER — SODIUM CHLORIDE 0.9 % IV SOLN
INTRAVENOUS | Status: DC
  Administered 2015-08-20: 21:00:00 via INTRAVENOUS

## 2015-08-20 MED ORDER — PREDNISONE 10 MG PO TABS
50.0000 mg | ORAL_TABLET | Freq: Every day | ORAL | Status: DC
Start: 2015-08-20 — End: 2015-08-23
  Administered 2015-08-22 – 2015-08-23 (×2): 50 mg via ORAL
  Filled 2015-08-20 (×2): qty 2

## 2015-08-20 NOTE — ED Notes (Signed)
Pt was ambulated by Iris RN and returned to her bed. Pt's O2 saturation dropper therefore the Pt was placed on O2.

## 2015-08-20 NOTE — Progress Notes (Signed)
[facility] [place] [facility]   PATIENT NAME: Yvette MonsJanie Sumler    MR#:  [id number]  DATE OF BIRTH:  November 22, 1950  SUBJECTIVE:  CHIEF COMPLAINT:   Chief Complaint  Patient presents with  . Weakness  . Diarrhea   Continues to complain of shortness of breath and cough. Some lower abdominal pain. No vomiting. Mild diarrhea. Improving. Has had some choking with liquids and drooling from left side of the face for 1 week. No focal weakness or numbness. No change in vision.  REVIEW OF SYSTEMS:    Review of Systems  Constitutional: Positive for malaise/fatigue. Negative for fever and chills.  HENT: Negative for sore throat.   Eyes: Negative for blurred vision, double vision and pain.  Respiratory: Positive for cough and shortness of breath. Negative for hemoptysis.   Cardiovascular: Negative for chest pain, palpitations, orthopnea and leg swelling.  Gastrointestinal: Positive for nausea, abdominal pain and diarrhea. Negative for heartburn, vomiting and constipation.  Genitourinary: Negative for dysuria and hematuria.  Musculoskeletal: Negative for back pain and joint pain.  Skin: Negative for rash.  Neurological: Positive for weakness and headaches. Negative for sensory change, speech change and focal weakness.  Endo/Heme/Allergies: Does not bruise/bleed easily.  Psychiatric/Behavioral: Negative for depression. The patient is not nervous/anxious.     DRUG ALLERGIES:   Allergies  Allergen Reactions  . Sulfa Antibiotics Hives    VITALS:  Blood pressure 101/58, pulse 82, temperature 97.7 F (36.5 C), temperature source Oral, resp. rate 22, height 5\' 2"  (1.575 m), weight 56.7 kg (125 lb), SpO2 93 %.  PHYSICAL EXAMINATION:   Physical Exam  GENERAL:  65 y.o.-year-old patient lying in the bed with no acute distress.  EYES: Pupils equal, round, reactive to light and accommodation. No scleral icterus. Extraocular muscles intact.  HEENT: Head atraumatic,  normocephalic. Oropharynx and nasopharynx clear.  NECK:  Supple, no jugular venous distention. No thyroid enlargement, no tenderness.  LUNGS: Decreased air entry at bases. Mild expiratory wheezing. CARDIOVASCULAR: S1, S2 normal. No murmurs, rubs, or gallops.  ABDOMEN: Soft, nondistended. Bowel sounds present. No organomegaly or mass. Lower abdominal tenderness EXTREMITIES: No cyanosis, clubbing or edema b/l.    NEUROLOGIC: Cranial nerves II through XII are intact. No focal Motor or sensory deficits b/l.   Mild,Left facial droop PSYCHIATRIC: The patient is alert and Awake. SKIN: No obvious rash, lesion, or ulcer.   LABORATORY PANEL:   CBC  Recent Labs Lab 08/19/15 2155  WBC 19.3*  HGB 13.5  HCT 39.0  PLT 195   ------------------------------------------------------------------------------------------------------------------ Chemistries   Recent Labs Lab 08/19/15 2155  NA 134*  K 3.6  CL 95*  CO2 26  GLUCOSE 137*  BUN 29*  CREATININE 0.99  CALCIUM 8.7*  AST 24  ALT 14  ALKPHOS 97  BILITOT 0.8   ------------------------------------------------------------------------------------------------------------------  Cardiac Enzymes  Recent Labs Lab 08/19/15 2155  TROPONINI 0.03   ------------------------------------------------------------------------------------------------------------------  RADIOLOGY:  Dg Chest 2 View  08/19/2015  CLINICAL DATA:  Acute onset of generalized weakness. Shortness of breath and cough. Decreased O2 saturation. Initial encounter. EXAM: CHEST  2 VIEW COMPARISON:  Chest radiograph from 06/20/2014 FINDINGS: A small left pleural effusion is noted. Bibasilar airspace opacities may reflect pneumonia or mild interstitial edema. Vascular congestion is noted. No pneumothorax is seen. The heart remains normal in size. No acute osseous abnormalities are identified. IMPRESSION: Small left pleural effusion noted. Bibasilar airspace opacities may reflect  pneumonia or mild interstitial edema. Vascular congestion noted. Electronically Signed  By: Roanna Raider M.D.   On: 08/19/2015 23:17   Ct Abdomen Pelvis W Contrast  08/20/2015  CLINICAL DATA:  Acute onset of left lower quadrant abdominal pain, generalized weakness, diarrhea, cough and shortness of breath. Initial encounter. EXAM: CT ABDOMEN AND PELVIS WITH CONTRAST TECHNIQUE: Multidetector CT imaging of the abdomen and pelvis was performed using the standard protocol following bolus administration of intravenous contrast. CONTRAST:  100 mL of Isovue 370 IV contrast COMPARISON:  CT of the abdomen and pelvis from 04/17/2015 FINDINGS: Dense focal bibasilar airspace opacities are compatible with multifocal pneumonia. Scattered coronary artery calcifications are seen. The liver and spleen are unremarkable in appearance. The gallbladder is within normal limits. The pancreas and adrenal glands are unremarkable. The kidneys are unremarkable in appearance. There is no evidence of hydronephrosis. No renal or ureteral stones are seen. No perinephric stranding is appreciated. No free fluid is identified. The small bowel is unremarkable in appearance. The stomach is within normal limits. No acute vascular abnormalities are seen. Scattered calcification is noted along the abdominal aorta and its branches. The appendix is normal in caliber and contains contrast, without evidence of appendicitis. Scattered diverticulosis is noted along the descending and proximal sigmoid colon. There is suggestion of mild wall thickening along the proximal sigmoid colon, which could reflect very mild diverticulitis. There is no evidence of perforation or abscess formation. The bladder is mildly distended and grossly unremarkable. The patient is status post hysterectomy. No suspicious adnexal masses are seen. No inguinal lymphadenopathy is seen. No acute osseous abnormalities are identified. IMPRESSION: 1. Dense focal bibasilar airspace  opacities are compatible with multifocal pneumonia. 2. Suggestion of mild wall thickening along the proximal sigmoid colon, which could reflect very mild diverticulitis. No evidence of perforation or abscess formation. 3. Scattered diverticulosis along the descending and proximal sigmoid colon. 4. Scattered calcification along the abdominal aorta and its branches. 5. Scattered coronary artery calcifications seen. Electronically Signed   By: Roanna Raider M.D.   On: 08/20/2015 01:05     ASSESSMENT AND PLAN:   39 F admit for multifocal pneumonia and diverticulitis.  * Bibasilar pneumonia with acute hypoxic respiratory failure Also has mild COPD exacerbation - Prednisone, Antibiotics - Scheduled Nebulizers - Inhalers -Wean O2 as tolerated - Consult pulmonary if no improvement  * Sigmoid diverticulitis On antibiotics. No abscess on CAT scan. Pain improved.  * Drooling and dysphagia Check MRI of the brain to rule out CVA. Nothing by mouth. Swallow evaluation.  * Tobacco abuse. Nicoderm 21 mg daily for 1 ppd  * Hyponatremia. Mild Asymptomatic. Monitor.  * DVT prophylaxis. Lovenox  All the records are reviewed and case discussed with Care Management/Social Workerr. Management plans discussed with the patient, family and they are in agreement.  CODE STATUS: FULL CODE  DVT Prophylaxis: SCDs  TOTAL TIME TAKING CARE OF THIS PATIENT: 40 minutes.   POSSIBLE D/C IN 2-3 DAYS, DEPENDING ON CLINICAL CONDITION.  Milagros Loll R M.D on 08/20/2015 at 12:15 PM  Between 7am to 6pm - Pager - [phone]  After 6pm go to [email or web address] - password [email or web address] [facility]  [facility]  Office  [phone]  CC: Primary care physician; No primary care provider on file.  Note: This dictation was prepared with Dragon dictation along with smaller phrase technology. Any transcriptional errors that result from this process are unintentional.

## 2015-08-20 NOTE — H&P (Addendum)
[facility] - [place] at [facility]   PATIENT NAME: Yvette Martinez    MR#:  [id number]  DATE OF BIRTH:  09-Aug-1950  DATE OF ADMISSION:  08/19/2015  PRIMARY CARE PHYSICIAN: No primary care provider on file.   REQUESTING/REFERRING PHYSICIAN: Dr. Inocencio Homes in ER  CHIEF COMPLAINT:   Chief Complaint  Patient presents with  . Weakness  . Diarrhea    HISTORY OF PRESENT ILLNESS:  Yvette Martinez  is a 65 y.o. female with a known history of COPD who doesn't normally require supplemental oxygen, anxiety came into hospital because patient said over the past 1 week she's had some decreased energy, decreased appetite and worsening shortness of breath associated with wheezing. She increased the frequency of her breathing treatments. Said that in the past couple days had a cough productive of clear sputum. She's had some weakness and he has fallen down about 10 times in the past 1 week. Patient said she's been having about one episode of diarrhea daily without evidence of blood or melena. She reported subjective fever. Family urged her to come to hospital. Patient reported pleuritic pain.   In the emergency room initially afebrile 98.4, blood pressure soft 92/58 improved with fluid bolus. Initially was 87% on room air improved to 93% with 3 L. CBC revealed a leukocytosis of 19.3, metabolic panel showed a mildly decreased serum sodium 134, mildly elevated BUN of 29. Should normal creatinine 0.99. Lactic acid normal at 1.55. Chest x-ray shows bibasilar opacity at the bases concerning for pneumonia. She underwent a CT of her abdomen and pelvis showed dense bibasilar opacities consistent with a multifocal pneumonia, had mild proximal inflammation consistent with colitis. Patient was given a dose of vancomycin and Zosyn after blood cultures were collected and then hospitalist called to admit for diverticulitis and multifocal pneumonia.  PAST MEDICAL HISTORY:   Past Medical History  Diagnosis  Date  . Diverticula of colon   . COPD (chronic obstructive pulmonary disease) (HCC)     PAST SURGICAL HISTORY:   Past Surgical History  Procedure Laterality Date  . Abdominal hysterectomy      SOCIAL HISTORY:   Social History  Substance Use Topics  . Smoking status: Current Every Day Smoker  . Smokeless tobacco: Not on file  . Alcohol Use: Not on file    FAMILY HISTORY:  No family history on file.  DRUG ALLERGIES:   Allergies  Allergen Reactions  . Sulfa Antibiotics Hives    REVIEW OF SYSTEMS:   ROS  Review of Systems:  Constitutional: No weight loss, night sweats, subjective fevers, chills, fatigue.  HEENT: No headaches, Difficulty swallowing,Tooth/dental problems,Sore throat, Cardio-vascular: No chest pain, Orthopnea, PND, swelling in lower extremities, anasarca, dizziness, palpitations  GI: daily diarrhea.  Resp: cough with white sputum as above Skin: no rash or lesions.  GU: no dysuria, change in color of urine, no urgency or frequency. No flank pain.  Musculoskeletal:  No joint pain or swelling. No decreased range of motion. No back pain.  Psych:  No change in mood or affect. No depression or anxiety. No memory loss.  Neuro:  No change in sensation, unilateral strength, or cognitive abilities  MEDICATIONS AT HOME:   Prior to Admission medications   Medication Sig Start Date End Date Taking? Authorizing Provider  ALPRAZolam Prudy Feeler) 1 MG tablet Take 1 tablet by mouth every 6 (six) hours as needed for anxiety.    Yes Historical Provider, MD  Fluticasone-Salmeterol (ADVAIR DISKUS) 100-50 MCG/DOSE AEPB Inhale 1  puff into the lungs 2 (two) times daily. 06/28/14  Yes Historical Provider, MD  mirtazapine (REMERON) 30 MG tablet Take 30 mg by mouth 2 (two) times daily.  03/26/15  Yes Historical Provider, MD  tiotropium (SPIRIVA) 18 MCG inhalation capsule Place 1 capsule into inhaler and inhale daily. 06/28/14  Yes Historical Provider, MD  VENTOLIN HFA 108 (90 Base)  MCG/ACT inhaler Inhale 1-2 puffs into the lungs every 6 (six) hours as needed.  03/26/15  Yes Historical Provider, MD  bisacodyl (DULCOLAX) 10 MG suppository Place 1 suppository (10 mg total) rectally as needed for moderate constipation. Patient not taking: Reported on 08/19/2015 04/17/15   Emily FilbertJonathan E Williams, MD  metroNIDAZOLE (FLAGYL) 500 MG tablet Take 1 tablet (500 mg total) by mouth 3 (three) times daily. Patient not taking: Reported on 08/19/2015 04/17/15   Emily FilbertJonathan E Williams, MD  oxyCODONE-acetaminophen (ROXICET) 5-325 MG tablet Take 1 tablet by mouth every 6 (six) hours as needed. Patient not taking: Reported on 08/19/2015 04/12/15   Minna AntisKevin Paduchowski, MD  polyethylene glycol [facility](MIRALAX / Ethelene HalGLYCOLAX) packet Take 8.5 g by mouth daily. Patient not taking: Reported on 08/19/2015 04/17/15   Emily FilbertJonathan E Williams, MD      VITAL SIGNS:  Blood pressure 107/64, pulse 85, temperature 98.4 F (36.9 C), temperature source Oral, resp. rate 22, height 5\' 2"  (1.575 m), weight 56.7 kg (125 lb), SpO2 93 %.  PHYSICAL EXAMINATION:  Physical Exam  GENERAL:  65 y.o.-year-old patient lying in the bed with no acute distress.  EYES: Pupils equal, round, reactive to light and accommodation. No scleral icterus. Extraocular muscles intact.  HEENT: Head atraumatic, normocephalic. Oropharynx and nasopharynx clear.  NECK:  Supple, no jugular venous distention. No thyroid enlargement, no tenderness.  LUNGS: Normal breath sounds bilaterally, no wheezing, rales,rhonchi or crepitation. No use of accessory muscles of respiration.  CARDIOVASCULAR: S1, S2 normal. No murmurs, rubs, or gallops.  ABDOMEN: Soft, nontender, nondistended. Bowel sounds present. No organomegaly or mass.  EXTREMITIES: No pedal edema, cyanosis, or clubbing.  NEUROLOGIC: Cranial nerves II through XII are intact. Muscle strength 5/5 in all extremities. Sensation intact. Gait not checked.  PSYCHIATRIC: The patient is alert and oriented x 3.  SKIN: No  obvious rash, lesion, or ulcer.   LABORATORY PANEL:   CBC  Recent Labs Lab 08/19/15 2155  WBC 19.3*  HGB 13.5  HCT 39.0  PLT 195   ------------------------------------------------------------------------------------------------------------------  Chemistries   Recent Labs Lab 08/19/15 2155  NA 134*  K 3.6  CL 95*  CO2 26  GLUCOSE 137*  BUN 29*  CREATININE 0.99  CALCIUM 8.7*  AST 24  ALT 14  ALKPHOS 97  BILITOT 0.8   ------------------------------------------------------------------------------------------------------------------  Cardiac Enzymes  Recent Labs Lab 08/19/15 2155  TROPONINI 0.03   ------------------------------------------------------------------------------------------------------------------  RADIOLOGY:  Dg Chest 2 View  08/19/2015  CLINICAL DATA:  Acute onset of generalized weakness. Shortness of breath and cough. Decreased O2 saturation. Initial encounter. EXAM: CHEST  2 VIEW COMPARISON:  Chest radiograph from 06/20/2014 FINDINGS: A small left pleural effusion is noted. Bibasilar airspace opacities may reflect pneumonia or mild interstitial edema. Vascular congestion is noted. No pneumothorax is seen. The heart remains normal in size. No acute osseous abnormalities are identified. IMPRESSION: Small left pleural effusion noted. Bibasilar airspace opacities may reflect pneumonia or mild interstitial edema. Vascular congestion noted. Electronically Signed   By: Roanna RaiderJeffery  Chang M.D.   On: 08/19/2015 23:17   Ct Abdomen Pelvis W Contrast  08/20/2015  CLINICAL DATA:  Acute onset  of left lower quadrant abdominal pain, generalized weakness, diarrhea, cough and shortness of breath. Initial encounter. EXAM: CT ABDOMEN AND PELVIS WITH CONTRAST TECHNIQUE: Multidetector CT imaging of the abdomen and pelvis was performed using the standard protocol following bolus administration of intravenous contrast. CONTRAST:  100 mL of Isovue 370 IV contrast COMPARISON:  CT of  the abdomen and pelvis from 04/17/2015 FINDINGS: Dense focal bibasilar airspace opacities are compatible with multifocal pneumonia. Scattered coronary artery calcifications are seen. The liver and spleen are unremarkable in appearance. The gallbladder is within normal limits. The pancreas and adrenal glands are unremarkable. The kidneys are unremarkable in appearance. There is no evidence of hydronephrosis. No renal or ureteral stones are seen. No perinephric stranding is appreciated. No free fluid is identified. The small bowel is unremarkable in appearance. The stomach is within normal limits. No acute vascular abnormalities are seen. Scattered calcification is noted along the abdominal aorta and its branches. The appendix is normal in caliber and contains contrast, without evidence of appendicitis. Scattered diverticulosis is noted along the descending and proximal sigmoid colon. There is suggestion of mild wall thickening along the proximal sigmoid colon, which could reflect very mild diverticulitis. There is no evidence of perforation or abscess formation. The bladder is mildly distended and grossly unremarkable. The patient is status post hysterectomy. No suspicious adnexal masses are seen. No inguinal lymphadenopathy is seen. No acute osseous abnormalities are identified. IMPRESSION: 1. Dense focal bibasilar airspace opacities are compatible with multifocal pneumonia. 2. Suggestion of mild wall thickening along the proximal sigmoid colon, which could reflect very mild diverticulitis. No evidence of perforation or abscess formation. 3. Scattered diverticulosis along the descending and proximal sigmoid colon. 4. Scattered calcification along the abdominal aorta and its branches. 5. Scattered coronary artery calcifications seen. Electronically Signed   By: Roanna Raider M.D.   On: 08/20/2015 01:05      IMPRESSION AND PLAN:   76 F admit for multifocal pneumonia and diverticulitis.  1. Multifocal PNA.  Leukocytosis, evidence on CXR and CT a/p. Received IV Vanco and Zosyn in ED. Patient has not recently been in hospital, treat as if CAP with Levaquin 750 daily, f/u sputum and blood culture, daily CBC. Check urine legionella and strep Ag.  2. Diverticulitis. Mild, patient with diarrhea. Check C-diff. Started on IV Levaquin and Flagyl.    3. COPD. Wean oxygen, normally doesn't require. Routine Pulmicort, duoneb and PRN Proventil. Abx as above  4. Anxiety. Continue mirtazapine 30mg  daily, PRN xanax  5. Tobacco abuse. Nicoderm 21 mg daily for 1 ppd  6. Hyponatremia. Mild, trend BMP after IVF  7. DVT prophylaxis. Lovenox 40 mg daily.   All the records are reviewed and case discussed with ED provider. Management plans discussed with the patient, family and they are in agreement.  CODE STATUS: Full  TOTAL TIME TAKING CARE OF THIS PATIENT: 40 minutes.    Joella Prince M.D on 08/20/2015 at 1:42 AM  Between 7am to 6pm - Pager - [phone]  After 6pm go to [email or web address] - Scientist, research (life sciences) [facility]  Office  [phone]  CC: Primary care physician; No primary care provider on file.   Note: This dictation was prepared with Dragon dictation along with smaller phrase technology. Any transcriptional errors that result from this process are unintentional.

## 2015-08-20 NOTE — Progress Notes (Signed)
PT Cancellation Note  Patient Details Name: [name] Brutus MRN: [id number] DOB: 26-Jul-1950   Cancelled Treatment:    Reason Eval/Treat Not Completed: Patient at procedure or test/unavailable. Patient is currently off the floor for MRI testing. PT will re-attempt at a later time/date as patient is available and appropriate.   Kerin RansomPatrick A Vara Mairena, PT, DPT    08/20/2015, 1:56 PM

## 2015-08-20 NOTE — ED Notes (Signed)
Pt was ambulated to the bedside commode. Pt completely missed the hat, therefore no urine was collected. Pt returned to her bed.

## 2015-08-20 NOTE — Progress Notes (Signed)
Patient seen for svn. Patient requesting ama papers. States she cannot have anything to eat or drink therefore she can go elsewhere. I stated that unfortunately I would have to let her RN handle that I be happy to get her because I am here to assist her with breathing. She stated whats the point...  She states she questioned RN however did not say if it was day shift or nightshift. She was on the phone the entire time. I told her if she wants her svn let her RN know so that I can give to her. Informed Tresa EndoKelly of whats going on with this patient.

## 2015-08-20 NOTE — Evaluation (Signed)
Occupational Therapy Evaluation Patient Details Name: SAFIYAH CISNEY MRN: [id number] DOB: 12/05/1950 Today's Date: 08/20/2015    History of Present Illness Sesilia Poucher is a 65 y.o. female with a known history of COPD who doesn't normally require supplemental oxygen, anxiety came into hospital because patient said over the past 1 week she's had some decreased energy, decreased appetite and worsening shortness of breath associated with wheezing. She increased the frequency of her breathing treatments. Said that in the past couple days had a cough productive of clear sputum. She's had some weakness and he has fallen down about 10 times in the past 1 week. Patient said she's been having about one episode of diarrhea daily without evidence of blood or melena. She reported subjective fever. Family urged her to come to hospital. Patient reported pleuritic pain.   Clinical Impression   Patient seen for OT evaluation this date, patient lives at home and has her grand daughter and her children who live with her.  She lives on the main level of her home while her family lives in the basement.  She reports she was independent prior to admission to the hospital including self care, driving and light homemaking until the last week or so and she felt weak, increased respiratory issues and had multiple falls.  She reports her grand daughter will not help her at home and it is unclear if she is an accurate historian of events leading up to hospitalization.  She presents with increased muscle weakness, pain in rib areas, decreased ability to perform transfers, functional mobility and decreased ability to perform basic self care and IADL tasks.  She would benefit from skilled OT to maximize safety and independence  In daily tasks. She may require short term rehab prior to returning home based on her ability to perform tasks during evaluation.     Follow Up Recommendations  SNF (depends on progress, increased  assist required at evaluation.)    Equipment Recommendations  3 in 1 bedside comode    Recommendations for Other Services       Precautions / Restrictions Precautions Precautions: Fall Restrictions Weight Bearing Restrictions: No      Mobility Bed Mobility Overal bed mobility: Needs Assistance Bed Mobility: Supine to Sit;Sit to Supine     Supine to sit: Min assist Sit to supine: Min assist   General bed mobility comments: cues for transfer, able to use both arms to assist with moving up in bed  Transfers Overall transfer level: Needs assistance   Transfers: Sit to/from Stand Sit to Stand: Min assist              Balance                                            ADL Overall ADL's : Needs assistance/impaired Eating/Feeding: Set up;Modified independent   Grooming: Set up;Modified independent;Bed level Grooming Details (indicate cue type and reason): difficulty with maintaining unsupported sitting balance at EOB for grooming tasks Upper Body Bathing: Set up;Minimal assitance;Bed level   Lower Body Bathing: Set up;Moderate assistance   Upper Body Dressing : Set up;Minimal assistance   Lower Body Dressing: Set up;Moderate assistance   Toilet Transfer: Minimal assistance                   Vision     Perception     Praxis  Pertinent Vitals/Pain Pain Assessment: 0-10 Pain Score: 8  Pain Location: ribs Pain Descriptors / Indicators: Sharp;Sore Pain Intervention(s): Limited activity within patient's tolerance;Monitored during session;Repositioned     Hand Dominance Right   Extremity/Trunk Assessment Upper Extremity Assessment Upper Extremity Assessment: Generalized weakness   Lower Extremity Assessment Lower Extremity Assessment: Generalized weakness       Communication Communication Communication: No difficulties   Cognition Arousal/Alertness: Awake/alert Behavior During Therapy: WFL for tasks  assessed/performed Overall Cognitive Status: Within Functional Limits for tasks assessed                     General Comments       Exercises       Shoulder Instructions      Home Living Family/patient expects to be discharged to:: Private residence Living Arrangements: Children;Other relatives Available Help at Discharge: Family Type of Home: House Home Access: Stairs to enter     Home Layout: Two level;Able to live on main level with bedroom/bathroom Alternate Level Stairs-Number of Steps: has a basement where her granddaughter and great grandkids stay.   Bathroom Shower/Tub: Tub/shower unit;Curtain Shower/tub characteristics: Engineer, building servicesCurtain Bathroom Toilet: Standard Bathroom Accessibility: No   Home Equipment: Environmental consultantWalker - 2 wheels   Additional Comments: reports she has a walker but did not use for ambulation.      Prior Functioning/Environment Level of Independence: Independent        Comments: Patient reports she was able to complete basic self care, light homemaking, driving and was retired.  She had increased difficulty with all tasks in the last 1-2 weeks.     OT Diagnosis: Generalized weakness;Acute pain;Other (comment) (decreased ability to perform self care, transfers and functional mobility)   OT Problem List: Decreased strength;Impaired balance (sitting and/or standing);Pain;Decreased safety awareness;Decreased activity tolerance;Decreased knowledge of use of DME or AE   OT Treatment/Interventions: Self-care/ADL training;Therapeutic exercise;Neuromuscular education;Therapeutic activities;DME and/or AE instruction;Patient/family education    OT Goals(Current goals can be found in the care plan section) Acute Rehab OT Goals Patient Stated Goal: Patient reports she wants to return home and be able to take care of herself.  OT Goal Formulation: With patient Time For Goal Achievement: 09/03/15 Potential to Achieve Goals: Good  OT Frequency: Min 1X/week    Barriers to D/C:            Co-evaluation              End of Session Nurse Communication: Other (comment) (remote to TV not working)  Activity Tolerance: Patient limited by pain Patient left: in bed;with call bell/phone within reach;with bed alarm set   Time: [id number] OT Time Calculation (min): 45 min Charges:  OT General Charges $OT Visit: 1 Procedure OT Evaluation $OT Eval Low Complexity: 1 Procedure OT Treatments $Self Care/Home Management : 23-37 mins G-Codes:    Amy T Lovett, OTR/L, CLT  Lovett,Amy 08/20/2015, 3:04 PM

## 2015-08-20 NOTE — Clinical Social Work Note (Signed)
CSW received consult for COPD Gold Protocol. Patient has had only one inpatient admission within 6 months to the hospital and therefore does not meet COPD Gold protocol criteria. York SpanielMonica Manoj Enriquez MSW,LCSW [phone]

## 2015-08-20 NOTE — ED Provider Notes (Signed)
-----------------------------------------   1:10 AM on 08/20/2015 -----------------------------------------  CT scan with mild diverticulitis, multifocal pneumonia is again noted. Patient is resting comfortably, blood pressure 107/64 at this time. She received antibiotics empirically, venous lactic acid is 1.5. Case discussed with hospitalist for admission at this time.  Gayla DossEryka A Khyson Sebesta, MD 08/20/15 0110

## 2015-08-20 NOTE — ED Notes (Signed)
Pt went to CT

## 2015-08-20 NOTE — ED Notes (Signed)
Pt walked to commode to urinate.  Pt walked well with one assist.  Pt noted to desat to 68% on RA.  Pt placed on nasal canula at 2L and O2 up to 88%.  [facility]pped [facility] to [facility] and notified primary nurse Sherilyn CooterHenry of this episode.

## 2015-08-20 NOTE — Progress Notes (Addendum)
Initial Nutrition Assessment  DOCUMENTATION CODES:   Not applicable  INTERVENTION:  -Monitor intake and cater to pt preferences -Recommend Ensure Enlive po BID, each supplement provides 350 kcal and 20 grams of protein    NUTRITION DIAGNOSIS:   Inadequate oral intake related to poor appetite, acute illness as evidenced by per patient/family report.    GOAL:   Patient will meet greater than or equal to 90% of their needs    MONITOR:   PO intake, Supplement acceptance  REASON FOR ASSESSMENT:   Consult Assessment of nutrition requirement/status  ASSESSMENT:      Pt admitted with weakness, diarrhea, poor po intake, pneumonia and mild diverticulitis  Past Medical History  Diagnosis Date  . Diverticula of colon   . COPD (chronic obstructive pulmonary disease) (HCC)    Pt reports poor po intake for the past week, eating bites of less per pt, no appetite. Eating breakfast this am, only bites taken.    Medications reviewed: remeron Labs reviewed: Na 134, BUN 29, creatinine WDL, glucose 137  Nutrition-Focused physical exam completed. Findings are no fat depletion, mild muscle depletion, and no edema.    Diet Order:  Diet regular Room service appropriate?: Yes; Fluid consistency:: Thin  Skin:  Reviewed, no issues  Last BM:  6/21  Height:   Ht Readings from Last 1 Encounters:  08/19/15 5\' 2"  (1.575 m)    Weight: Noted per wt encounters 8% wt loss in the last 4 months  Wt Readings from Last 1 Encounters:  08/19/15 125 lb (56.7 kg)    Ideal Body Weight:     BMI:  Body mass index is 22.86 kg/(m^2).  Estimated Nutritional Needs:   Kcal:  1425-1710 kcals/d  Protein:  68-86 g/d  Fluid:  1.4-1.7 L/d  EDUCATION NEEDS:   No education needs identified at this time  Siedah Sedor B. Freida BusmanAllen, RD, LDN [phone] (pager) Weekend/On-Call pager [phone])

## 2015-08-20 NOTE — ED Notes (Signed)
Pt returned from CT °

## 2015-08-20 NOTE — Evaluation (Signed)
Physical Therapy Evaluation Patient Details Name: Yvette Martinez MRN: [id number] DOB: May 04, 1950 Today's Date: 08/20/2015   History of Present Illness  Yvette Martinez is a 65 y.o. female with a known history of COPD who doesn't normally require supplemental oxygen, anxiety came into hospital because patient said over the past 1 week she's had some decreased energy, decreased appetite and worsening shortness of breath associated with wheezing. She increased the frequency of her breathing treatments. Said that in the past couple days had a cough productive of clear sputum. She's had some weakness and he has fallen down about 10 times in the past 1 week. Patient said she's been having about one episode of diarrhea daily without evidence of blood or melena. She reported subjective fever. Family urged her to come to hospital. Patient reported pleuritic pain.  Clinical Impression  Patient admitted with decreased O2 sats and multiple falls recently. She was noted to have diverticulitis and multi-focal pneumonia. She ambulates independently at baseline, however today requires bilateral HHA with IV pole and desaturates to 83% with 2L during ambulation. She had removed O2 initially and was noted to be at 83% when PT entered the room. Patient demonstrated fatigue and decreased gait speed with ambulation indicative of deconditioning and weakness. She is at high risk for falling again, given her recent spell (10 falls in last week). Would recommend SNF placement to increase strength and safety prior to return home.     Follow Up Recommendations SNF    Equipment Recommendations  Rolling walker with 5" wheels    Recommendations for Other Services       Precautions / Restrictions Precautions Precautions: Fall Restrictions Weight Bearing Restrictions: No      Mobility  Bed Mobility Overal bed mobility: Needs Assistance Bed Mobility: Supine to Sit;Sit to Supine     Supine to sit: Min assist Sit to  supine: Min assist   General bed mobility comments: Patient struggles to bring LEs over the EOB, requires cuing to use hand rails and negotiate trunk.   Transfers Overall transfer level: Needs assistance Equipment used:  (IV pole with 2 HHA) Transfers: Sit to/from Stand Sit to Stand: Min guard         General transfer comment: Patient is initially shaky throughout transfer, no assist required from PT.   Ambulation/Gait   Ambulation Distance (Feet): 100 Feet (Totaled over 3 separate bouts) Assistive device:  (IV assist for 2 hands ) Gait Pattern/deviations: WFL(Within Functional Limits);Decreased step length - right;Decreased step length - left;Trunk flexed   Gait velocity interpretation: Below normal speed for age/gender General Gait Details: Patient with trunk flexed to push IV pole with both hands, unsteady otherwise. O2 sats decreased from 90% to 82-83% after ambulation, recovered on 4L (on 2L at rest).   Stairs            Wheelchair Mobility    Modified Rankin (Stroke Patients Only)       Balance Overall balance assessment: Needs assistance Sitting-balance support: No upper extremity supported Sitting balance-Leahy Scale: Good     Standing balance support: Bilateral upper extremity supported Standing balance-Leahy Scale: Fair                               Pertinent Vitals/Pain Pain Assessment: 0-10 Pain Score: 10-Worst pain ever Pain Location: L ribs Pain Descriptors / Indicators: Sharp;Sore Pain Intervention(s): Limited activity within patient's tolerance;Monitored during session;Premedicated before session;Repositioned    Home Living  Family/patient expects to be discharged to:: Private residence Living Arrangements: Children;Other relatives Available Help at Discharge: Family Type of Home: House Home Access: Stairs to enter     Home Layout: Two level;Able to live on main level with bedroom/bathroom Home Equipment: Walker - 2  wheels Additional Comments: reports she has a walker but did not use for ambulation.    Prior Function Level of Independence: Independent         Comments: Patient reports she was able to complete basic self care, light homemaking, driving and was retired.  She had increased difficulty with all tasks in the last 1-2 weeks.      Hand Dominance   Dominant Hand: Right    Extremity/Trunk Assessment   Upper Extremity Assessment: Overall WFL for tasks assessed (Decreased strength from baseline, but able to complete functional tasks.)           Lower Extremity Assessment: Overall WFL for tasks assessed (Decreased strength from baseline, but able to complete functional tasks. )         Communication   Communication: No difficulties  Cognition Arousal/Alertness: Awake/alert Behavior During Therapy: WFL for tasks assessed/performed;Flat affect Overall Cognitive Status: Within Functional Limits for tasks assessed                      General Comments      Exercises Other Exercises Other Exercises: Assisted patient to using the commode with min-cga x1 and IV pole for support.       Assessment/Plan    PT Assessment Patient needs continued PT services  PT Diagnosis Difficulty walking;Generalized weakness   PT Problem List Decreased strength;Decreased knowledge of use of DME;Decreased safety awareness;Decreased activity tolerance;Decreased balance;Decreased mobility;Cardiopulmonary status limiting activity  PT Treatment Interventions DME instruction;Gait training;Stair training;Therapeutic activities;Balance training;Therapeutic exercise   PT Goals (Current goals can be found in the Care Plan section) Acute Rehab PT Goals Patient Stated Goal: Patient reports she wants to return home and be able to take care of herself.  PT Goal Formulation: With patient Time For Goal Achievement: 09/03/15 Potential to Achieve Goals: Good    Frequency Min 2X/week   Barriers to  discharge        Co-evaluation               End of Session Equipment Utilized During Treatment: Gait belt;Oxygen Activity Tolerance: Patient tolerated treatment well;Patient limited by fatigue Patient left: in bed;with call bell/phone within reach;with bed alarm set;with nursing/sitter in room Nurse Communication: Mobility status         Time: 1710-1740 PT Time Calculation (min) (ACUTE ONLY): 30 min   Charges:   PT Evaluation $PT Eval Moderate Complexity: 1 Procedure PT Treatments $Therapeutic Activity: 8-22 mins    PT G Codes:       Kerin Ransom, PT, DPT    08/20/2015, 6:41 PM

## 2015-08-21 LAB — CBC WITH DIFFERENTIAL/PLATELET
BASOS ABS: 0 10*3/uL (ref 0–0.1)
EOS ABS: 0 10*3/uL (ref 0–0.7)
Eosinophils Relative: 0 %
HCT: 34.5 % — ABNORMAL LOW (ref 35.0–47.0)
Hemoglobin: 11.9 g/dL — ABNORMAL LOW (ref 12.0–16.0)
Lymphocytes Relative: 8 %
Lymphs Abs: 1.1 10*3/uL (ref 1.0–3.6)
MCH: 33.8 pg (ref 26.0–34.0)
MCHC: 34.6 g/dL (ref 32.0–36.0)
MCV: 97.6 fL (ref 80.0–100.0)
MONO ABS: 0.7 10*3/uL (ref 0.2–0.9)
NEUTROS ABS: 11.6 10*3/uL — AB (ref 1.4–6.5)
Neutrophils Relative %: 87 %
PLATELETS: 195 10*3/uL (ref 150–440)
RBC: 3.53 MIL/uL — ABNORMAL LOW (ref 3.80–5.20)
RDW: 13.1 % (ref 11.5–14.5)
WBC: 13.5 10*3/uL — ABNORMAL HIGH (ref 3.6–11.0)

## 2015-08-21 LAB — URINE CULTURE: CULTURE: NO GROWTH

## 2015-08-21 LAB — BASIC METABOLIC PANEL
Anion gap: 9 (ref 5–15)
BUN: 10 mg/dL (ref 6–20)
CO2: 28 mmol/L (ref 22–32)
CREATININE: 0.64 mg/dL (ref 0.44–1.00)
Calcium: 7.9 mg/dL — ABNORMAL LOW (ref 8.9–10.3)
Chloride: 102 mmol/L (ref 101–111)
GFR calc Af Amer: >60 mL/min (ref >60)
GLUCOSE: 73 mg/dL (ref 65–99)
POTASSIUM: 2.8 mmol/L — AB (ref 3.5–5.1)
Sodium: 139 mmol/L (ref 135–145)

## 2015-08-21 LAB — GLUCOSE, CAPILLARY
GLUCOSE-CAPILLARY: 81 mg/dL (ref 65–99)
GLUCOSE-CAPILLARY: 119 mg/dL — AB (ref 65–99)
Glucose-Capillary: 82 mg/dL (ref 65–99)
Glucose-Capillary: 69 mg/dL (ref 65–99)
Glucose-Capillary: 104 mg/dL — ABNORMAL HIGH (ref 65–99)

## 2015-08-21 LAB — EXPECTORATED SPUTUM ASSESSMENT W REFEX TO RESP CULTURE

## 2015-08-21 LAB — MAGNESIUM: MAGNESIUM: 2 mg/dL (ref 1.7–2.4)

## 2015-08-21 LAB — LEGIONELLA PNEUMOPHILA SEROGP 1 UR AG: L. PNEUMOPHILA SEROGP 1 UR AG: NEGATIVE

## 2015-08-21 LAB — EXPECTORATED SPUTUM ASSESSMENT W GRAM STAIN, RFLX TO RESP C

## 2015-08-21 LAB — HIV ANTIBODY (ROUTINE TESTING W REFLEX): HIV SCREEN 4TH GENERATION: NONREACTIVE

## 2015-08-21 MED ORDER — DEXTROSE-NACL 5-0.45 % IV SOLN
INTRAVENOUS | Status: DC
  Administered 2015-08-21: 07:00:00 via INTRAVENOUS

## 2015-08-21 MED ORDER — LEVOFLOXACIN IN D5W 750 MG/150ML IV SOLN
750.0000 mg | INTRAVENOUS | Status: DC
  Administered 2015-08-21 – 2015-08-22 (×2): 750 mg via INTRAVENOUS
  Filled 2015-08-21 (×2): qty 150

## 2015-08-21 MED ORDER — MORPHINE SULFATE (PF) 2 MG/ML IV SOLN
1.0000 mg | INTRAVENOUS | Status: DC | PRN
  Administered 2015-08-21 – 2015-08-23 (×2): 1 mg via INTRAVENOUS
  Filled 2015-08-21 (×2): qty 1

## 2015-08-21 MED ORDER — DEXTROSE 50 % IV SOLN
INTRAVENOUS | Status: AC
  Administered 2015-08-21: 25 mL
  Filled 2015-08-21: qty 50

## 2015-08-21 MED ORDER — OXYCODONE-ACETAMINOPHEN 5-325 MG PO TABS
1.0000 | ORAL_TABLET | Freq: Four times a day (QID) | ORAL | Status: DC | PRN
  Administered 2015-08-22 (×2): 1 via ORAL
  Filled 2015-08-21 (×2): qty 1

## 2015-08-21 MED ORDER — POTASSIUM CHLORIDE 10 MEQ/100ML IV SOLN
10.0000 meq | INTRAVENOUS | Status: AC
  Administered 2015-08-21 (×4): 10 meq via INTRAVENOUS
  Filled 2015-08-21 (×4): qty 100

## 2015-08-21 MED ORDER — POTASSIUM CHLORIDE CRYS ER 20 MEQ PO TBCR
40.0000 meq | EXTENDED_RELEASE_TABLET | Freq: Once | ORAL | Status: AC
  Administered 2015-08-21: 40 meq via ORAL
  Filled 2015-08-21: qty 2

## 2015-08-21 NOTE — Progress Notes (Signed)
Physical Therapy Treatment Patient Details Name: Yvette Martinez MRN: [id number] DOB: 01-06-1951 Today's Date: 08/21/2015    History of Present Illness Yvette Martinez is a 65 y.o. female with a known history of COPD who doesn't normally require supplemental oxygen, anxiety came into hospital because patient said over the past 1 week she's had some decreased energy, decreased appetite and worsening shortness of breath associated with wheezing. She increased the frequency of her breathing treatments. Said that in the past couple days had a cough productive of clear sputum. She's had some weakness and he has fallen down about 10 times in the past 1 week. Patient said she's been having about one episode of diarrhea daily without evidence of blood or melena. She reported subjective fever. Family urged her to come to hospital. Patient reported pleuritic pain.    PT Comments    Returned by patients room this am for equipment.  Pt requesting to get back to bed.  Encouraged pt to stay up but she declined.  Attempted to assist pt in stand pivot transfer to bed but she did not put much effort into transfer.  Obtained walker and she required mod a to stand and min assist with heavy verbal cues to turn "I have never gotten in on this side of the bed before".  Required mod assist to get le's into bed and to position for comfort.  Discussed with care managed regarding HHPT vc SNF upon d/c.    Follow Up Recommendations  Home health PT;SNF     Equipment Recommendations  Rolling walker with 5" wheels    Recommendations for Other Services       Precautions / Restrictions Precautions Precautions: Fall Restrictions Weight Bearing Restrictions: No    Mobility  Bed Mobility Overal bed mobility: Needs Assistance Bed Mobility: Sit to Supine     Supine to sit: Mod assist     General bed mobility comments: able to manage le's today.  min a to get to edge of bed  Transfers Overall transfer level:  Needs assistance Equipment used: Rolling walker (2 wheeled) Transfers: Sit to/from Stand Sit to Stand: Min assist            Ambulation/Gait Ambulation/Gait assistance: Min guard Ambulation Distance (Feet): 100 Feet Assistive device: Rolling walker (2 wheeled) Gait Pattern/deviations: Step-through pattern;Decreased step length - right;Decreased step length - left;Narrow base of support   Gait velocity interpretation: <1.8 ft/sec, indicative of risk for recurrent falls General Gait Details: agreed to walker trial today.     Stairs            Wheelchair Mobility    Modified Rankin (Stroke Patients Only)       Balance Overall balance assessment: Needs assistance Sitting-balance support: Feet supported Sitting balance-Leahy Scale: Good     Standing balance support: Bilateral upper extremity supported Standing balance-Leahy Scale: Fair Standing balance comment: heavy use of rw for gait                    Cognition Arousal/Alertness: Awake/alert Behavior During Therapy: WFL for tasks assessed/performed Overall Cognitive Status: Within Functional Limits for tasks assessed                      Exercises      General Comments        Pertinent Vitals/Pain Pain Assessment: 0-10 Pain Score: 10-Worst pain ever Pain Location: ribs Pain Descriptors / Indicators: Sharp;Shooting Pain Intervention(s): Limited activity within patient's tolerance  Home Living                      Prior Function            PT Goals (current goals can now be found in the care plan section) Progress towards PT goals: Progressing toward goals    Frequency  Min 2X/week    PT Plan Discharge plan needs to be updated    Co-evaluation             End of Session Equipment Utilized During Treatment: Gait belt;Oxygen Activity Tolerance: Patient tolerated treatment well;Patient limited by fatigue;Patient limited by pain Patient left: in chair;with  call bell/phone within reach;with chair alarm set;with nursing/sitter in room     Time: 1210-1220 PT Time Calculation (min) (ACUTE ONLY): 10 min  Charges:  $Gait Training: 23-37 mins $Therapeutic Activity: 8-22 mins                    G Codes:      Danielle DessSarah Rosita Guzzetta, PTA 08/21/2015, 12:30 PM

## 2015-08-21 NOTE — Care Management (Signed)
SATURATION QUALIFICATIONS: (This note is used to comply with regulatory documentation for home oxygen)  Per PT documentation  Patient Saturations on Room Air while Ambulating = 83%  Patient Saturations on *2 Liters of oxygen while Ambulating = 90%  Please briefly explain why patient needs home oxygen: unable to maintain saturations on RA with exertion

## 2015-08-21 NOTE — Care Management Note (Addendum)
Case Management Note  Patient Details  Name: Yvette Martinez MRN: [id number] Date of Birth: 20-Apr-1950  Subjective/Objective:              Patient admitted with PNA.  Patient lives at home with her adult granddaughter and her yound children. Patient states that [facility]  Patient states that at her baseline she does not require medical equipment for ambulation and drives. PT recommending HH/SNF; OT recommending SNF.  Patient is agreeable to bed search.  CSW notified. If patient returns home she would like to use [facility].  Patient has qualifying saturations for home O2 documented 08/21/15.Marland Kitchen.    Action/Plan: RNCM following  Expected Discharge Date:                  Expected Discharge Plan:     In-House Referral:     Discharge planning Services  [facility], Med[facility]  Post Acute Care Choice:    Choice offered to:     DME Arranged:    DME Agency:     HH Arranged:    HH Agency:     Status of Service:  Completed, signed off  If discussed at [employer] Length of Stay Meetings, dates discussed:    Additional Comments:  Chapman FitchBOWEN, Masae Lukacs T, RN 08/21/2015, 3:14 PM

## 2015-08-21 NOTE — [place] FL2 (Signed)
[place] MEDICAID FL2 LEVEL OF CARE SCREENING TOOL     IDENTIFICATION  Patient Name: Yvette Martinez Birthdate: May 30, 1950 Sex: female Admission Date (Current Location): 08/19/2015  [place]   Facility and Address:  [facility], [address], [place], [place] [address]      Provider Number: [id number]  Attending Physician Name and Address:  Milagros LollSrikar Sudini, MD  Relative Name and Phone Number:       Current Level of Care: Hospital Recommended Level of Care: Skilled Nursing Facility Prior Approval Number:    Date Approved/Denied:   PASRR Number:    Discharge Plan: SNF    Current Diagnoses: Patient Active Problem List   Diagnosis Date Noted  . CAP (community acquired pneumonia) 08/20/2015    Orientation RESPIRATION BLADDER Height & Weight     Self, Time, Situation, Place  Normal, O2 (2 liters o2) Continent Weight: 125 lb (56.7 kg) Height:  5\' 2"  (157.5 cm)  BEHAVIORAL SYMPTOMS/MOOD NEUROLOGICAL BOWEL NUTRITION STATUS   (none)  (none) Continent Diet (regular)  AMBULATORY STATUS COMMUNICATION OF NEEDS Skin   Limited Assist Verbally Normal                       Personal Care Assistance Level of Assistance  Bathing, Dressing Bathing Assistance: Limited assistance   Dressing Assistance: Limited assistance     Functional Limitations Info  Sight, Hearing, Speech Sight Info: Adequate Hearing Info: Adequate Speech Info: Adequate    SPECIAL CARE FACTORS FREQUENCY  PT (By licensed PT), OT (By licensed OT)                    Contractures Contractures Info: Not present    Additional Factors Info  Code Status, Allergies Code Status Info: full Allergies Info: sulfa abx           Current Medications (08/21/2015):  This is the current hospital active medication list Current Facility-Administered Medications  Medication Dose Route Frequency Provider Last Rate Last Dose  . acetaminophen  (TYLENOL) tablet 650 mg  650 mg Oral Q6H PRN Joella PrinceJames Sullivan, MD       Or  . acetaminophen (TYLENOL) suppository 650 mg  650 mg Rectal Q6H PRN Joella PrinceJames Sullivan, MD      . albuterol (PROVENTIL) (2.5 MG/3ML) 0.083% nebulizer solution 2.5 mg  2.5 mg Nebulization Q4H PRN Joella PrinceJames Sullivan, MD   2.5 mg at 08/20/15 2050  . ALPRAZolam Prudy Feeler(XANAX) tablet 1 mg  1 mg Oral Q6H PRN Joella PrinceJames Sullivan, MD   1 mg at 08/21/15 1605  . antiseptic oral rinse (CPC / CETYLPYRIDINIUM CHLORIDE 0.05%) solution 7 mL  7 mL Mouth Rinse BID Joella PrinceJames Sullivan, MD   7 mL at 08/21/15 1002  . bisacodyl (DULCOLAX) suppository 10 mg  10 mg Rectal PRN Joella PrinceJames Sullivan, MD      . budesonide (PULMICORT) nebulizer solution 0.25 mg  0.25 mg Nebulization BID Joella PrinceJames Sullivan, MD   0.25 mg at 08/21/15 0733  . dextrose 5 %-0.45 % sodium chloride infusion   Intravenous Continuous Milagros LollSrikar Sudini, MD 50 mL/hr at 08/21/15 1009    . feeding supplement (ENSURE ENLIVE) (ENSURE ENLIVE) liquid 237 mL  237 mL Oral BID BM Srikar Sudini, MD   237 mL at 08/20/15 1019  . ipratropium-albuterol (DUONEB) 0.5-2.5 (3) MG/3ML nebulizer solution 3 mL  3 mL Nebulization Q6H Joella PrinceJames Sullivan, MD   3 mL at 08/21/15 1447  . levofloxacin (LEVAQUIN) IVPB 750 mg  750 mg Intravenous Q24H Milagros LollSrikar Sudini, MD   750 mg at 08/21/15 1140  . metroNIDAZOLE (FLAGYL) IVPB 500 mg  500 mg Intravenous Q8H Joella PrinceJames Sullivan, MD   500 mg at 08/21/15 1607  . mirtazapine (REMERON) tablet 30 mg  30 mg Oral BID Joella PrinceJames Sullivan, MD   30 mg at 08/20/15 1005  . morphine 2 MG/ML injection 1 mg  1 mg Intravenous Q4H PRN Milagros LollSrikar Sudini, MD   1 mg at 08/21/15 1605  . nicotine (NICODERM CQ - dosed in mg/24 hours) patch 21 mg  21 mg Transdermal Daily Joella PrinceJames Sullivan, MD   21 mg at 08/21/15 1009  . ondansetron (ZOFRAN) tablet 4 mg  4 mg Oral Q6H PRN Joella PrinceJames Sullivan, MD       Or  . ondansetron [facility](ZOFRAN) injection 4 mg  4 mg Intravenous Q6H PRN Joella PrinceJames Sullivan, MD      . oxyCODONE-acetaminophen (PERCOCET/ROXICET) 5-325 MG per  tablet 1 tablet  1 tablet Oral Q6H PRN Srikar Sudini, MD      . polyethylene glycol (MIRALAX / GLYCOLAX) packet 8.5 g  8.5 g Oral Daily Joella PrinceJames Sullivan, MD   8.5 g at 08/20/15 1008  . predniSONE (DELTASONE) tablet 50 mg  50 mg Oral Q breakfast Milagros LollSrikar Sudini, MD   Stopped at 08/20/15 1230     Discharge Medications: Please see discharge summary for a list of discharge medications.  Relevant Imaging Results:  Relevant Lab Results:   Additional Information ss: [id number]  York SpanielMonica Addysyn Fern, LCSW

## 2015-08-21 NOTE — Progress Notes (Signed)
[facility] [place] [facility]   PATIENT NAME: Yvette Martinez    MR#:  [id number]  DATE OF BIRTH:  05-21-1950  SUBJECTIVE:  CHIEF COMPLAINT:   Chief Complaint  Patient presents with  . Weakness  . Diarrhea   Still has SOB. Clear sputum with cough. Lower abd pain better. On 2 L O2  REVIEW OF SYSTEMS:    Review of Systems  Constitutional: Positive for malaise/fatigue. Negative for fever and chills.  HENT: Negative for sore throat.   Eyes: Negative for blurred vision, double vision and pain.  Respiratory: Positive for cough and shortness of breath. Negative for hemoptysis.   Cardiovascular: Negative for chest pain, palpitations, orthopnea and leg swelling.  Gastrointestinal: Positive for nausea, abdominal pain and diarrhea. Negative for heartburn, vomiting and constipation.  Genitourinary: Negative for dysuria and hematuria.  Musculoskeletal: Negative for back pain and joint pain.  Skin: Negative for rash.  Neurological: Positive for weakness and headaches. Negative for sensory change, speech change and focal weakness.  Endo/Heme/Allergies: Does not bruise/bleed easily.  Psychiatric/Behavioral: Negative for depression. The patient is not nervous/anxious.    DRUG ALLERGIES:   Allergies  Allergen Reactions  . Sulfa Antibiotics Hives    VITALS:  Blood pressure 119/67, pulse 84, temperature 98.1 F (36.7 C), temperature source Oral, resp. rate 18, height  (1.575 m), weight 56.7 kg (125 lb), SpO2 96 %.  PHYSICAL EXAMINATION:   Physical Exam  GENERAL:  65 y.o.-year-old patient lying in the bed with no acute distress.  EYES: Pupils equal, round, reactive to light and accommodation. No scleral icterus. Extraocular muscles intact.  HEENT: Head atraumatic, normocephalic. Oropharynx and nasopharynx clear.  NECK:  Supple, no jugular venous distention. No thyroid enlargement, no tenderness.  LUNGS: Decreased air entry at bases. NO  wheezing CARDIOVASCULAR: S1, S2 normal. No murmurs, rubs, or gallops.  ABDOMEN: Soft, nondistended. Bowel sounds present. No organomegaly or mass. Lower abdominal tenderness EXTREMITIES: No cyanosis, clubbing or edema b/l.    NEUROLOGIC: Cranial nerves II through XII are intact. No focal Motor or sensory deficits b/l.   PSYCHIATRIC: The patient is alert and Awake. SKIN: No obvious rash, lesion, or ulcer.   LABORATORY PANEL:   CBC  Recent Labs Lab 08/21/15 0632  WBC 13.5*  HGB 11.9*  HCT 34.5*  PLT 195   ------------------------------------------------------------------------------------------------------------------ Chemistries   Recent Labs Lab 08/19/15 2155 08/21/15 0632  NA 134* 139  K 3.6 2.8*  CL 95* 102  CO2 26 28  GLUCOSE 137* 73  BUN 29* 10  CREATININE 0.99 0.64  CALCIUM 8.7* 7.9*  AST 24  --   ALT 14  --   ALKPHOS 97  --   BILITOT 0.8  --    ------------------------------------------------------------------------------------------------------------------  Cardiac Enzymes  Recent Labs Lab 08/19/15 2155  TROPONINI 0.03   ------------------------------------------------------------------------------------------------------------------  RADIOLOGY:  Dg Chest 2 View  08/19/2015  CLINICAL DATA:  Acute onset of generalized weakness. Shortness of breath and cough. Decreased O2 saturation. Initial encounter. EXAM: CHEST  2 VIEW COMPARISON:  Chest radiograph from 06/20/2014 FINDINGS: A small left pleural effusion is noted. Bibasilar airspace opacities may reflect pneumonia or mild interstitial edema. Vascular congestion is noted. No pneumothorax is seen. The heart remains normal in size. No acute osseous abnormalities are identified. IMPRESSION: Small left pleural effusion noted. Bibasilar airspace opacities may reflect pneumonia or mild interstitial edema. Vascular congestion noted. Electronically Signed   By: Roanna Raider M.D.   On: 08/19/2015 23:17   Mr  Brain  Wo Contrast  08/20/2015  CLINICAL DATA:  Generalized weakness.  Multiple recent falls. EXAM: MRI HEAD WITHOUT CONTRAST TECHNIQUE: Multiplanar, multiecho pulse sequences of the brain and surrounding structures were obtained without intravenous contrast. COMPARISON:  None. FINDINGS: No evidence for acute infarction, hemorrhage, mass lesion, hydrocephalus, or extra-axial fluid. Premature for age atrophy. Mild subcortical and periventricular T2 and FLAIR hyperintensities, likely chronic microvascular ischemic change. Flow voids are maintained throughout the carotid, basilar, and vertebral arteries. There are no areas of chronic hemorrhage. Pituitary, pineal, and cerebellar tonsils unremarkable. No upper cervical lesions. Visualized calvarium, skull base, and upper cervical osseous structures unremarkable. Scalp and extracranial soft tissues, orbits, sinuses, and mastoids show no acute process. IMPRESSION: Atrophy and small vessel disease.  No acute intracranial findings. Electronically Signed   By: Elsie StainJohn T Curnes M.D.   On: 08/20/2015 14:02   Ct Abdomen Pelvis W Contrast  08/20/2015  CLINICAL DATA:  Acute onset of left lower quadrant abdominal pain, generalized weakness, diarrhea, cough and shortness of breath. Initial encounter. EXAM: CT ABDOMEN AND PELVIS WITH CONTRAST TECHNIQUE: Multidetector CT imaging of the abdomen and pelvis was performed using the standard protocol following bolus administration of intravenous contrast. CONTRAST:  100 mL of Isovue 370 IV contrast COMPARISON:  CT of the abdomen and pelvis from 04/17/2015 FINDINGS: Dense focal bibasilar airspace opacities are compatible with multifocal pneumonia. Scattered coronary artery calcifications are seen. The liver and spleen are unremarkable in appearance. The gallbladder is within normal limits. The pancreas and adrenal glands are unremarkable. The kidneys are unremarkable in appearance. There is no evidence of hydronephrosis. No renal or ureteral  stones are seen. No perinephric stranding is appreciated. No free fluid is identified. The small bowel is unremarkable in appearance. The stomach is within normal limits. No acute vascular abnormalities are seen. Scattered calcification is noted along the abdominal aorta and its branches. The appendix is normal in caliber and contains contrast, without evidence of appendicitis. Scattered diverticulosis is noted along the descending and proximal sigmoid colon. There is suggestion of mild wall thickening along the proximal sigmoid colon, which could reflect very mild diverticulitis. There is no evidence of perforation or abscess formation. The bladder is mildly distended and grossly unremarkable. The patient is status post hysterectomy. No suspicious adnexal masses are seen. No inguinal lymphadenopathy is seen. No acute osseous abnormalities are identified. IMPRESSION: 1. Dense focal bibasilar airspace opacities are compatible with multifocal pneumonia. 2. Suggestion of mild wall thickening along the proximal sigmoid colon, which could reflect very mild diverticulitis. No evidence of perforation or abscess formation. 3. Scattered diverticulosis along the descending and proximal sigmoid colon. 4. Scattered calcification along the abdominal aorta and its branches. 5. Scattered coronary artery calcifications seen. Electronically Signed   By: Roanna RaiderJeffery  Chang M.D.   On: 08/20/2015 01:05     ASSESSMENT AND PLAN:   [age] F admit for multifocal pneumonia and diverticulitis.  * Bibasilar pneumonia with acute hypoxic respiratory failure Also has mild COPD exacerbation - Prednisone, Antibiotics - Scheduled Nebulizers - Inhalers -Wean O2 as tolerated  * Sigmoid diverticulitis On antibiotics. No abscess on CT scan. Pain improved.  * Dysphagia Did well with swallow eval. MRI brain normal.  * Tobacco abuse. Nicoderm  * Hyponatremia. Mild Asymptomatic. Monitor.   * DVT prophylaxis. Lovenox  All the records  are reviewed and case discussed with Care Management/Social Workerr. Management plans discussed with the patient, family and they are in agreement.  CODE STATUS: FULL CODE  DVT Prophylaxis: SCDs  TOTAL TIME TAKING CARE OF THIS PATIENT: 30 minutes.   POSSIBLE D/C IN 1-2 DAYS, DEPENDING ON CLINICAL CONDITION.  Milagros LollSudini, Shawn Dannenberg R M.D on 08/21/2015 at 10:59 AM  Between 7am to 6pm - Pager - [phone]  After 6pm go to [email or web address] - password [email or web address] [facility]  [facility]  Office  [phone]  CC: Primary care physician; No primary care provider on file.  Note: This dictation was prepared with Dragon dictation along with smaller phrase technology. Any transcriptional errors that result from this process are unintentional.

## 2015-08-21 NOTE — Evaluation (Signed)
Clinical/Bedside Swallow Evaluation Patient Details  Name: Yvette Martinez F Bergeman MRN: [id number] Date of Birth: [birth year]  Today's Date: 08/21/2015 Time: SLP Start Time (ACUTE ONLY): 1005 SLP Stop Time (ACUTE ONLY): 1038 SLP Time Calculation (min) (ACUTE ONLY): 33 min  Past Medical History:  Past Medical History  Diagnosis Date  . Diverticula of colon   . COPD (chronic obstructive pulmonary disease) (HCC)    Past Surgical History:  Past Surgical History  Procedure Laterality Date  . Abdominal hysterectomy     HPI:      Assessment / Plan / Recommendation Clinical Impression  pt presents with minimal risk for aspiraiton. pt was not noted to have any overt ssx aspiraiton during trials of ndds1,2, or regular solids or with trials of thin liquids. pt stated she was unsure why she told md she has difficulty swallowing as she only has coughing x1 a month. S Tprovided education on aspiration risk and signs and symptoms to consider. pt denies any coughing or adverse sensations during intake. SLP recommends upgrade to regular with thin. MD notified.     Aspiration Risk  Mild aspiration risk    Diet Recommendation Regular;Thin liquid   Liquid Administration via: Cup;Straw Medication Administration: Whole meds with liquid Supervision: Patient able to self feed Compensations: Slow rate;Small sips/bites;Follow solids with liquid Postural Changes: Seated upright at 90 degrees    Other  Recommendations     Follow up Recommendations       Frequency and Duration min 1 x/week  1 week       Prognosis Prognosis for Safe Diet Advancement: Good      Swallow Study   General Date of Onset: 08/20/15 Type of Study: Bedside Swallow Evaluation Diet Prior to this Study: NPO Temperature Spikes Noted: No Respiratory Status: Room air History of Recent Intubation: No Behavior/Cognition: Alert;Cooperative;Pleasant mood Oral Cavity Assessment: Within Functional Limits Oral Care Completed by SLP:  No Oral Cavity - Dentition: Adequate natural dentition Vision: Functional for self-feeding Self-Feeding Abilities: Able to feed self Patient Positioning: Upright in bed Baseline Vocal Quality: Normal Volitional Cough: Strong Volitional Swallow: Able to elicit    Oral/Motor/Sensory Function Overall Oral Motor/Sensory Function: Within functional limits   Ice Chips Ice chips: Within functional limits Presentation: Self Fed;Spoon   Thin Liquid Thin Liquid: Within functional limits Presentation: Cup;Straw;Self Fed    Nectar Thick Nectar Thick Liquid: Not tested   Honey Thick Honey Thick Liquid: Within functional limits Presentation: Self fed;Cup   Puree Puree: Within functional limits Presentation: Self Fed;Spoon   Solid   GO   Solid: Within functional limits Presentation: Self Fed;Spoon        Meredith PelStacie Harris Sauber 08/21/2015,1:41 PM

## 2015-08-21 NOTE — Progress Notes (Signed)
Pharmacy Note - Renal dose adjustment  Pharmacy consulted to dose adjust antibiotics for renal function in this 65 year old female.  Current orders for levofloxacin 750mg  IV Q48H  Estimated Creatinine Clearance: 54.7 mL/min (by C-G formula based on Cr of 0.64).  Renal function improved, will adjust to levofloxacin 750mg  IV Q24H for CrCl > 1849ml/min  Garlon HatchetJody Tanya Marvin, PharmD Clinical Pharmacist  08/21/2015 10:20 AM

## 2015-08-21 NOTE — Progress Notes (Signed)
Physical Therapy Treatment Patient Details Name: Yvette Martinez MRN: [id number] DOB: March 31, 1950 Today's Date: 08/21/2015    History of Present Illness Yvette Martinez is a 65 y.o. female with a known history of COPD who doesn't normally require supplemental oxygen, anxiety came into hospital because patient said over the past 1 week she's had some decreased energy, decreased appetite and worsening shortness of breath associated with wheezing. She increased the frequency of her breathing treatments. Said that in the past couple days had a cough productive of clear sputum. She's had some weakness and he has fallen down about 10 times in the past 1 week. Patient said she's been having about one episode of diarrhea daily without evidence of blood or melena. She reported subjective fever. Family urged her to come to hospital. Patient reported pleuritic pain.    PT Comments    Pt in bed, agrees to session.  Pt agreed to trial of rolling walker.  States she has a  Environmental consultantWalker at home but does not use it as it does not fit in bathroom door.  Pt was able to ambulate 100' with min guard.  Sats after gait on room air 83%.  Pt placed on 1 lpm per nurse for several minutes but remained 87/88%.  Returned to 2 lpm where she reached 90%.  Primary nurse in room to monitor.  [facility] need and qualification for home O2 if she discharges home.  Pt encouraged to use walker at home upon discharge as weakness deficits remain evident but overall gait and mobility improved since yesterday with use of walker.  No loss of balances.  Pt encouraged to stay up in chair for lunch.     Follow Up Recommendations  Home health PT;SNF     Equipment Recommendations  Rolling walker with 5" wheels    Recommendations for Other Services       Precautions / Restrictions Precautions Precautions: Fall Restrictions Weight Bearing Restrictions: No    Mobility  Bed Mobility Overal bed mobility: Needs  Assistance Bed Mobility: Supine to Sit     Supine to sit: Min assist     General bed mobility comments: able to manage le's today.  min a to get to edge of bed  Transfers Overall transfer level: Needs assistance Equipment used: Rolling walker (2 wheeled) Transfers: Sit to/from Stand Sit to Stand: Min guard            Ambulation/Gait Ambulation/Gait assistance: Min guard Ambulation Distance (Feet): 100 Feet Assistive device: Rolling walker (2 wheeled) Gait Pattern/deviations: Step-through pattern;Decreased step length - right;Decreased step length - left;Narrow base of support   Gait velocity interpretation: <1.8 ft/sec, indicative of risk for recurrent falls General Gait Details: agreed to walker trial today.     Stairs            Wheelchair Mobility    Modified Rankin (Stroke Patients Only)       Balance Overall balance assessment: Needs assistance Sitting-balance support: Feet supported Sitting balance-Leahy Scale: Good     Standing balance support: Bilateral upper extremity supported Standing balance-Leahy Scale: Fair Standing balance comment: heavy use of rw for gait                    Cognition Arousal/Alertness: Awake/alert Behavior During Therapy: WFL for tasks assessed/performed Overall Cognitive Status: Within Functional Limits for tasks assessed  Exercises      General Comments        Pertinent Vitals/Pain Pain Assessment: 0-10 Pain Score: 10-Worst pain ever Pain Location: ribs Pain Descriptors / Indicators: Sharp;Shooting Pain Intervention(s): Limited activity within patient's tolerance    Home Living                      Prior Function            PT Goals (current goals can now be found in the care plan section) Progress towards PT goals: Progressing toward goals    Frequency  Min 2X/week    PT Plan Discharge plan needs to be updated    Co-evaluation              End of Session Equipment Utilized During Treatment: Gait belt;Oxygen Activity Tolerance: Patient tolerated treatment well;Patient limited by fatigue;Patient limited by pain Patient left: in chair;with call bell/phone within reach;with chair alarm set;with nursing/sitter in room     Time: [id number] PT Time Calculation (min) (ACUTE ONLY): 33 min  Charges:  $Gait Training: 23-37 mins                    G Codes:      Danielle DessSarah Zephaniah Lubrano, PTA 08/21/2015, 12:06 PM

## 2015-08-22 LAB — BASIC METABOLIC PANEL
Anion gap: 8 (ref 5–15)
BUN: 6 mg/dL (ref 6–20)
CALCIUM: 8.2 mg/dL — AB (ref 8.9–10.3)
CHLORIDE: 99 mmol/L — AB (ref 101–111)
CO2: 29 mmol/L (ref 22–32)
CREATININE: 0.53 mg/dL (ref 0.44–1.00)
GFR calc Af Amer: >60 mL/min (ref >60)
GFR calc non Af Amer: >60 mL/min (ref >60)
Glucose, Bld: 117 mg/dL — ABNORMAL HIGH (ref 65–99)
Potassium: 3.2 mmol/L — ABNORMAL LOW (ref 3.5–5.1)
SODIUM: 136 mmol/L (ref 135–145)

## 2015-08-22 LAB — CULTURE, RESPIRATORY W GRAM STAIN

## 2015-08-22 LAB — GLUCOSE, CAPILLARY
GLUCOSE-CAPILLARY: 106 mg/dL — AB (ref 65–99)
GLUCOSE-CAPILLARY: 117 mg/dL — AB (ref 65–99)
GLUCOSE-CAPILLARY: 129 mg/dL — AB (ref 65–99)
GLUCOSE-CAPILLARY: 196 mg/dL — AB (ref 65–99)
GLUCOSE-CAPILLARY: 224 mg/dL — AB (ref 65–99)

## 2015-08-22 LAB — CULTURE, RESPIRATORY

## 2015-08-22 MED ORDER — ENOXAPARIN SODIUM 40 MG/0.4ML ~~LOC~~ SOLN
40.0000 mg | SUBCUTANEOUS | Status: DC
  Administered 2015-08-22: 40 mg via SUBCUTANEOUS
  Filled 2015-08-22: qty 0.4

## 2015-08-22 MED ORDER — ZOLPIDEM TARTRATE 5 MG PO TABS
5.0000 mg | ORAL_TABLET | Freq: Every evening | ORAL | Status: DC | PRN
  Administered 2015-08-22: 5 mg via ORAL
  Filled 2015-08-22: qty 1

## 2015-08-22 MED ORDER — POTASSIUM CHLORIDE CRYS ER 20 MEQ PO TBCR
40.0000 meq | EXTENDED_RELEASE_TABLET | ORAL | Status: AC
  Administered 2015-08-22: 40 meq via ORAL
  Filled 2015-08-22: qty 2

## 2015-08-22 MED ORDER — METRONIDAZOLE 500 MG PO TABS
500.0000 mg | ORAL_TABLET | Freq: Three times a day (TID) | ORAL | Status: DC
  Administered 2015-08-22 – 2015-08-23 (×3): 500 mg via ORAL
  Filled 2015-08-22 (×3): qty 1

## 2015-08-22 MED ORDER — LEVOFLOXACIN 750 MG PO TABS
750.0000 mg | ORAL_TABLET | Freq: Every day | ORAL | Status: DC
  Administered 2015-08-23: 750 mg via ORAL
  Filled 2015-08-22: qty 1

## 2015-08-22 NOTE — Care Management (Signed)
SATURATION QUALIFICATIONS: (This note is used to comply with regulatory documentation for home oxygen)  Per PT documentation  Patient Saturations on Room Air at Rest = 94%  Patient Saturations on Room Air while Ambulating = 87%  Patient Saturations on 2 Liters of oxygen while Ambulating = 90%  Please briefly explain why patient needs home oxygen: unable to maintain saturations with exertion

## 2015-08-22 NOTE — Progress Notes (Cosign Needed)
Speech Language Pathology Treatment: Dysphagia  Patient Details Name: Yvette Martinez MRN: [id number] DOB: 1950/12/16 Today's Date: 08/22/2015 Time: 2256-7209 SLP Time Calculation (min) (ACUTE ONLY): 45 min  Assessment / Plan / Recommendation Clinical Impression  Pt seen for toleration of current diet w/ education on general aspiration precautions. Pt stated as session began that she did get choked when "lying back and drinking my water". Time spent on education pt on importance of sitting upright for any po's as well as modeling the position and assisting pt into an upright position. Once pt assisted in sitting herself upright for po intake, she consumed sips of water via straw and bites of a banana w/ no overt s/s of aspiration noted; clear vocal quality b/t trials. Recommend continue w/ current diet as ordered w/ general aspiration precautions; med in puree whole if easier for swallowing. No further skilled ST services indicated at this time. NSG updated and agreed to reconsult if any change in status while admitted.   HPI HPI: Pt is a 65 y.o. female with a known history of COPD who doesn't normally require supplemental oxygen, anxiety came into hospital because patient said over the past 1 week she's had some decreased energy, decreased appetite and worsening shortness of breath associated with wheezing. She increased the frequency of her breathing treatments. Said that in the past couple days had a cough productive of clear sputum. She's had some weakness and he has fallen down about 10 times in the past 1 week. Patient said she's been having about one episode of diarrhea daily without evidence of blood or melena. She reported subjective fever, colitis dx'd as well on admission w/ multifocal pneumonia.       SLP Plan  All goals met     Recommendations  Diet recommendations: Regular;Thin liquid Liquids provided via: Straw;Cup Medication Administration: Whole meds with puree (as needed for  easier swallowing) Supervision: Patient able to self feed (setup assist as needed) Compensations: Minimize environmental distractions;Slow rate;Small sips/bites Postural Changes and/or Swallow Maneuvers: Seated upright 90 degrees             General recommendations:  (none) Oral Care Recommendations: Oral care BID;Patient independent with oral care Follow up Recommendations: None Plan: All goals met     GO               Yvette Kenner, MS, CCC-SLP  Yvette Martinez 08/22/2015, 12:43 PM

## 2015-08-22 NOTE — Care Management (Signed)
Plan for patient to discharge tomorrow. Patient has declined SNF placement.  Patient has selected [facility].  Order is in for home health. And Barbara CowerJason with [facility] is aware.  Order for home O2 has been placed.  Qualifying sats 08/22/15.  Barbara CowerJason with [facility] has been notified.  Portable tank and rolling walker to be delivered to room 08/22/15

## 2015-08-22 NOTE — Care Management Important Message (Signed)
Important Message  Patient Details  Name: Yvette Martinez MRN: [id number] Date of Birth: October 16, 1950   Medicare Important Message Given:  Yes    Olegario MessierKathy A Rahkeem Senft 08/22/2015, 11:19 AM
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
## 2015-08-22 NOTE — Progress Notes (Signed)
Occupational Therapy Treatment Patient Details Name: Yvette Martinez MRN: [id number] DOB: June 01, 1950 Today's Date: 08/22/2015    History of present illness Yvette Martinez is a 65 y.o. female with a known history of COPD who doesn't normally require supplemental oxygen, anxiety came into hospital because patient said over the past 1 week she's had some decreased energy, decreased appetite and worsening shortness of breath associated with wheezing. She increased the frequency of her breathing treatments. Said that in the past couple days had a cough productive of clear sputum. She's had some weakness and he has fallen down about 10 times in the past 1 week. Patient said she's been having about one episode of diarrhea daily without evidence of blood or melena. She reported subjective fever. Family urged her to come to hospital. Patient reported pleuritic pain.   OT comments  Pt. Is a 65 y.o. Female who was admitted with COPD. Pt. Continues to present with decreased activity tolerance, weakness, decreased strength, and impaired functional mobility for ADL tasks. Pt. Could benefit from skilled OT services to work on improving ADL training, A/E training, UE strength, functional mobility for ADLs, therapeutic ex., energy conservation, work simplification techniques, and pt. Ed. In order to regain independence and work towards returning to her PLOF.   Follow Up Recommendations  Home health OT;SNF    Equipment Recommendations       Recommendations for Other Services      Precautions / Restrictions Precautions Precautions: Fall Restrictions Weight Bearing Restrictions: No       Mobility Bed Mobility                  Transfers Overall transfer level: Needs assistance Equipment used: Rolling walker (2 wheeled) Transfers: Sit to/from Stand Sit to Stand: Min guard              Balance Overall balance assessment: Needs assistance Sitting-balance support: Feet supported Sitting  balance-Leahy Scale: Good     Standing balance support: Bilateral upper extremity supported Standing balance-Leahy Scale: Fair                     ADL   Eating/Feeding: Set up   Grooming: Set up               Lower Body Dressing: Minimal assistance               Functional mobility during ADLs: Minimal assistance General ADL Comments: Pt. education was provided about energy conservation techniques, and work simplification strategies for home. Pt. ed. was also provided about pursed lip breathing techniques, and A/E use.      Vision                     Perception     Praxis      Cognition   Behavior During Therapy: WFL for tasks assessed/performed Overall Cognitive Status: Within Functional Limits for tasks assessed                       Extremity/Trunk Assessment               Exercises   Shoulder Instructions       General Comments      Pertinent Vitals/ Pain       Pain Assessment: 0-10 Pain Score: 7  Pain Location: ribs Pain Descriptors / Indicators: Sharp;Sore Pain Intervention(s): Limited activity within patient's tolerance  Home Living  Prior Functioning/Environment              Frequency Min 1X/week     Progress Toward Goals  OT Goals(current goals can now be found in the care plan section)  Progress towards OT goals: Progressing toward goals  Acute Rehab OT Goals Patient Stated Goal: To be independent OT Goal Formulation: With patient Time For Goal Achievement: 09/03/15 Potential to Achieve Goals: Good  Plan Discharge plan remains appropriate    Co-evaluation                 End of Session Equipment Utilized During Treatment: Gait belt   Activity Tolerance Patient tolerated treatment well   Patient Left in chair;with call bell/phone within reach;with chair alarm set   Nurse Communication          Time: [id number] OT  Time Calculation (min): 40 min  Charges: OT General Charges $OT Visit: 1 Procedure OT Treatments $Self Care/Home Management : 38-52 mins   Yvette MessierElaine Larcenia Holaday, MS, OTR/L   Yvette Martinez 08/22/2015, 3:21 PM

## 2015-08-22 NOTE — Progress Notes (Signed)
[facility] -  at [facility]   PATIENT NAME: Yvette MonsJanie Martinez    MR#:  [id number]  DATE OF BIRTH:  [birth year]  SUBJECTIVE:  CHIEF COMPLAINT:   Chief Complaint  Patient presents with  . Weakness  . Diarrhea   Still has SOB. Clear sputum with cough. Lower abd pain better. On 2 L O2  REVIEW OF SYSTEMS:    Review of Systems  Constitutional: Positive for malaise/fatigue. Negative for fever and chills.  HENT: Negative for sore throat.   Eyes: Negative for blurred vision, double vision and pain.  Respiratory: Positive for cough and shortness of breath. Negative for hemoptysis.   Cardiovascular: Negative for chest pain, palpitations, orthopnea and leg swelling.  Gastrointestinal: Positive for nausea, abdominal pain and diarrhea. Negative for heartburn, vomiting and constipation.  Genitourinary: Negative for dysuria and hematuria.  Musculoskeletal: Negative for back pain and joint pain.  Skin: Negative for rash.  Neurological: Positive for weakness and headaches. Negative for sensory change, speech change and focal weakness.  Endo/Heme/Allergies: Does not bruise/bleed easily.  Psychiatric/Behavioral: Negative for depression. The patient is not nervous/anxious.    DRUG ALLERGIES:   Allergies  Allergen Reactions  . Sulfa Antibiotics Hives    VITALS:  Blood pressure 123/58, pulse 92, temperature 98.3 F (36.8 C), temperature source Oral, resp. rate 18, height 5\' 2"  (1.575 m), weight 56.7 kg (125 lb), SpO2 86 %.  PHYSICAL EXAMINATION:   Physical Exam  GENERAL:  65 y.o.-year-old patient lying in the bed with no acute distress.  EYES: Pupils equal, round, reactive to light and accommodation. No scleral icterus. Extraocular muscles intact.  HEENT: Head atraumatic, normocephalic. Oropharynx and nasopharynx clear.  NECK:  Supple, no jugular venous distention. No thyroid enlargement, no tenderness.  LUNGS: Decreased air entry at bases. NO  wheezing CARDIOVASCULAR: S1, S2 normal. No murmurs, rubs, or gallops.  ABDOMEN: Soft, nondistended. Bowel sounds present. No organomegaly or mass. Lower abdominal tenderness EXTREMITIES: No cyanosis, clubbing or edema b/l.    NEUROLOGIC: Cranial nerves II through XII are intact. No focal Motor or sensory deficits b/l.   PSYCHIATRIC: The patient is alert and Awake. SKIN: No obvious rash, lesion, or ulcer.   LABORATORY PANEL:   CBC  Recent Labs Lab 08/21/15 0632  WBC 13.5*  HGB 11.9*  HCT 34.5*  PLT 195   ------------------------------------------------------------------------------------------------------------------ Chemistries   Recent Labs Lab 08/19/15 2155 08/21/15 0632 08/22/15 0832  NA 134* 139 136  K 3.6 2.8* 3.2*  CL 95* 102 99*  CO2 26 28 29   GLUCOSE 137* 73 117*  BUN 29* 10 6  CREATININE 0.99 0.64 0.53  CALCIUM 8.7* 7.9* 8.2*  MG  --  2.0  --   AST 24  --   --   ALT 14  --   --   ALKPHOS 97  --   --   BILITOT 0.8  --   --    ------------------------------------------------------------------------------------------------------------------  Cardiac Enzymes  Recent Labs Lab 08/19/15 2155  TROPONINI 0.03   ------------------------------------------------------------------------------------------------------------------  RADIOLOGY:  Mr Brain Wo Contrast  08/20/2015  CLINICAL DATA:  Generalized weakness.  Multiple recent falls. EXAM: MRI HEAD WITHOUT CONTRAST TECHNIQUE: Multiplanar, multiecho pulse sequences of the brain and surrounding structures were obtained without intravenous contrast. COMPARISON:  None. FINDINGS: No evidence for acute infarction, hemorrhage, mass lesion, hydrocephalus, or extra-axial fluid. Premature for age atrophy. Mild subcortical and periventricular T2 and FLAIR hyperintensities, likely chronic microvascular ischemic change. Flow voids are maintained throughout the carotid, basilar, and  vertebral arteries. There are no areas of  chronic hemorrhage. Pituitary, pineal, and cerebellar tonsils unremarkable. No upper cervical lesions. Visualized calvarium, skull base, and upper cervical osseous structures unremarkable. Scalp and extracranial soft tissues, orbits, sinuses, and mastoids show no acute process. IMPRESSION: Atrophy and small vessel disease.  No acute intracranial findings. Electronically Signed   By: Elsie StainJohn T Curnes M.D.   On: 08/20/2015 14:02     ASSESSMENT AND PLAN:   [age] F admit for multifocal pneumonia and diverticulitis.  * Bibasilar pneumonia with acute hypoxic respiratory failure Also has mild COPD exacerbation - Prednisone, Antibiotics - Scheduled Nebulizers - Inhalers -Wean O2 as tolerated  Likely discharge tomorrow. Will need Levaquin and Flagyl. May need home oxygen. Check saturations on ambulation on room air prior to discharge.  * Sigmoid diverticulitis Continue antibiotics. No abscess on CT scan. Pain improved.  * Dysphagia resolved. Could have been due to cough and shortness of breath. Did well with swallow eval. tolerating normal diet MRI brain normal.  * Tobacco abuse. Nicoderm  * Hyponatremia. Mild Asymptomatic. Monitor.   * DVT prophylaxis. Lovenox  All the records are reviewed and case discussed with Care Management/Social Workerr. Management plans discussed with the patient, family and they are in agreement.  CODE STATUS: FULL CODE  DVT Prophylaxis: SCDs  TOTAL TIME TAKING CARE OF THIS PATIENT: 30 minutes.   POSSIBLE D/C IN 1-2 DAYS, DEPENDING ON CLINICAL CONDITION.  Milagros LollSudini, Maxamilian Amadon R M.D on 08/22/2015 at 1:22 PM  Between 7am to 6pm - Pager - [phone]  After 6pm go to [email or web address] - password [email or web address] [email or web address]  [facility]  Office  [phone]  CC: Primary care physician; No primary care provider on file.  Note: This dictation was prepared with Dragon dictation along with smaller phrase technology. Any transcriptional errors that result from this  process are unintentional.

## 2015-08-22 NOTE — Progress Notes (Signed)
Physical Therapy Treatment Patient Details Name: Yvette Martinez MRN: [id number] DOB: Sep 01, 1950 Today's Date: 08/22/2015    History of Present Illness Yvette Martinez is a 65 y.o. female with a known history of COPD who doesn't normally require supplemental oxygen, anxiety came into hospital because patient said over the past 1 week she's had some decreased energy, decreased appetite and worsening shortness of breath associated with wheezing. She increased the frequency of her breathing treatments. Said that in the past couple days had a cough productive of clear sputum. She's had some weakness and he has fallen down about 10 times in the past 1 week. Patient said she's been having about one episode of diarrhea daily without evidence of blood or melena. She reported subjective fever. Family urged her to come to hospital. Patient reported pleuritic pain.    PT Comments    Pt in chair ready for session.  Stood with min a x 1 today with good awareness of hand placements.  O2 at rest 94% on 2 lpm.  O2 removed and after 50' O2 dipped to 87% on room air.  Returned O2 at 2lpm and sats increased to 90%. Relayed to nurse and care manager need for home O2.  Participated in exercises as described below.  Pt stated she has walker without wheels at home that she does not use because it is too hard to pick up.  Discussed with care manager regarding wheels vs whole walker.     Follow Up Recommendations  Home health PT     Equipment Recommendations  Other (comment) (pt has newer stnd walker ? wheels only vs whole walker )    Recommendations for Other Services       Precautions / Restrictions Precautions Precautions: Fall Restrictions Weight Bearing Restrictions: No    Mobility  Bed Mobility                  Transfers Overall transfer level: Needs assistance Equipment used: Rolling walker (2 wheeled) Transfers: Sit to/from Stand Sit to Stand: Min assist             Ambulation/Gait Ambulation/Gait assistance: Min guard Ambulation Distance (Feet): 50 Feet Assistive device: Rolling walker (2 wheeled) Gait Pattern/deviations: Step-through pattern;Decreased step length - right;Decreased step length - left;Narrow base of support   Gait velocity interpretation: <1.8 ft/sec, indicative of risk for recurrent falls General Gait Details: gait without O2 to qualify for home O2 (2 lpm at rest 94%, dropped to 87% so ambulation was stopped.)   Careers information officertairs            Wheelchair Mobility    Modified Rankin (Stroke Patients Only)       Balance Overall balance assessment: Needs assistance Sitting-balance support: Feet supported Sitting balance-Leahy Scale: Good     Standing balance support: Bilateral upper extremity supported Standing balance-Leahy Scale: Fair                      Cognition Arousal/Alertness: Awake/alert Behavior During Therapy: WFL for tasks assessed/performed Overall Cognitive Status: Within Functional Limits for tasks assessed                      Exercises General Exercises - Lower Extremity Hip ABduction/ADduction: AROM;Both;10 reps;Standing Straight Leg Raises: AROM;Both;10 reps;Standing Hip Flexion/Marching: AROM;Both;10 reps;Standing Toe Raises: AROM;Both;10 reps;Standing    General Comments        Pertinent Vitals/Pain Pain Assessment: 0-10 Pain Score: 8  Pain Location: ribs Pain Descriptors / Indicators:  Sharp;Sore Pain Intervention(s): Limited activity within patient's tolerance    Home Living                      Prior Function            PT Goals (current goals can now be found in the care plan section) Progress towards PT goals: Progressing toward goals    Frequency  Min 2X/week    PT Plan Current plan remains appropriate    Co-evaluation             End of Session Equipment Utilized During Treatment: Gait belt;Oxygen Activity Tolerance: Patient tolerated  treatment well Patient left: in chair;with call bell/phone within reach;with chair alarm set;with nursing/sitter in room     Time: 1430-1455 PT Time Calculation (min) (ACUTE ONLY): 25 min  Charges:  $Gait Training: 8-22 mins $Therapeutic Exercise: 8-22 mins                    G Codes:      Yvette Martinez, PTA 08/22/2015, 3:13 PM

## 2015-08-22 NOTE — Clinical Social Work Note (Signed)
Clinical Social Work Assessment  Patient Details  Name: Yvette Martinez MRN: [id number] Date of Birth: 1951-01-14  Date of referral:  08/22/15               Reason for consult:  Facility Placement                Permission sought to share information with:  Facility Sport and exercise psychologist, Family Supports Permission granted to share information::  Yes, Verbal Permission Granted  Name::        Agency::     Relationship::     Contact Information:     Housing/Transportation Living arrangements for the past 2 months:  Single Family Home Source of Information:  Patient Patient Interpreter Needed:  None Criminal Activity/Legal Involvement Pertinent to Current Situation/Hospitalization:  No - Comment as needed Significant Relationships:  Friend Lives with:  Self (granddaughter and grandchildren) Do you feel safe going back to the place where you live?  Yes Need for family participation in patient care:  Yes (Comment)  Care giving concerns:  Patient lives alone and currently is needing assistance with ADL's.   Social Worker assessment / plan:  CSW informed yesterday by the RN CM that patient was agreeable to consider STR. CSW met with patient this morning and extended the bed offers to patient. Patient states that her female friend, Herbie Baltimore, who lived with her for 36 years has stated that he would hire a CNA to come assist her at home. Patient stated if she cannot go to [facility], she does not want to go to rehab. Patient's mobility is somewhat questionable as yesterday afternoon, patient was witnessed ambulating with PT and PT initially recommended home health but then approximately 30 minutes later, PT returned to patient's room and patient claimed she was so weak she could not even lift her arms well or roll on her side. Thus, PT then stated that they did not know which level of care to recommend and put both [facility] and SNF in their note. It is possible that patient may be able to do more than she is  willing to demonstrate. Employment status:  Retired Nurse, adult PT Recommendations:  Home with [employer], [place] / Referral to community resources:  [place]  Patient/Family's Response to care:  Patient expressed appreciation for CSW assistance.  Patient/Family's Understanding of and Emotional Response to Diagnosis, Current Treatment, and Prognosis:  Patient believes she will have the care she needs in her home setting.  Emotional Assessment Appearance:  Appears older than stated age Attitude/Demeanor/Rapport:   (pleasant and cooperative) Affect (typically observed):  Calm Orientation:  Oriented to Self, Oriented to Place, Oriented to  Time, Oriented to Situation Alcohol / Substance use:  Not Applicable Psych involvement (Current and /or in the community):  No (Comment)  Discharge Needs  Concerns to be addressed:  Care Coordination Readmission within the last 30 days:  No Current discharge risk:  None Barriers to Discharge:  No Barriers Identified   Shela Leff, LCSW 08/22/2015, 10:01 AM

## 2015-08-23 DIAGNOSIS — J9601 Acute respiratory failure with hypoxia: Secondary | ICD-10-CM

## 2015-08-23 DIAGNOSIS — R197 Diarrhea, unspecified: Secondary | ICD-10-CM

## 2015-08-23 DIAGNOSIS — Z720 Tobacco use: Secondary | ICD-10-CM

## 2015-08-23 DIAGNOSIS — J441 Chronic obstructive pulmonary disease with (acute) exacerbation: Secondary | ICD-10-CM

## 2015-08-23 DIAGNOSIS — R531 Weakness: Secondary | ICD-10-CM

## 2015-08-23 DIAGNOSIS — K5720 Diverticulitis of large intestine with perforation and abscess without bleeding: Secondary | ICD-10-CM

## 2015-08-23 LAB — BASIC METABOLIC PANEL
Anion gap: 10 (ref 5–15)
BUN: 13 mg/dL (ref 6–20)
CHLORIDE: 99 mmol/L — AB (ref 101–111)
CO2: 29 mmol/L (ref 22–32)
CREATININE: 0.73 mg/dL (ref 0.44–1.00)
Calcium: 8.7 mg/dL — ABNORMAL LOW (ref 8.9–10.3)
GFR calc Af Amer: >60 mL/min (ref >60)
GLUCOSE: 154 mg/dL — AB (ref 65–99)
POTASSIUM: 3.8 mmol/L (ref 3.5–5.1)
Sodium: 138 mmol/L (ref 135–145)

## 2015-08-23 LAB — CULTURE, RESPIRATORY W GRAM STAIN

## 2015-08-23 LAB — CULTURE, RESPIRATORY: CULTURE: NORMAL

## 2015-08-23 LAB — GLUCOSE, CAPILLARY
Glucose-Capillary: 120 mg/dL — ABNORMAL HIGH (ref 65–99)
Glucose-Capillary: 178 mg/dL — ABNORMAL HIGH (ref 65–99)

## 2015-08-23 MED ORDER — METRONIDAZOLE 500 MG PO TABS: 500.0000 mg | ORAL_TABLET | Freq: Three times a day (TID) | ORAL | Status: DC

## 2015-08-23 MED ORDER — ALBUTEROL SULFATE (2.5 MG/3ML) 0.083% IN NEBU: 2.5000 mg | INHALATION_SOLUTION | RESPIRATORY_TRACT | Status: DC | PRN

## 2015-08-23 MED ORDER — PREDNISONE 10 MG (21) PO TBPK: 10.0000 mg | ORAL_TABLET | Freq: Every day | ORAL | Status: DC

## 2015-08-23 MED ORDER — CETYLPYRIDINIUM CHLORIDE 0.05 % MT LIQD
7.0000 mL | Freq: Two times a day (BID) | OROMUCOSAL | Status: DC
Start: 2015-08-23 — End: 2017-02-21

## 2015-08-23 MED ORDER — LEVOFLOXACIN 750 MG PO TABS: 750.0000 mg | ORAL_TABLET | Freq: Every day | ORAL | Status: DC

## 2015-08-23 MED ORDER — NICOTINE 21 MG/24HR TD PT24: 21.0000 mg | MEDICATED_PATCH | Freq: Every day | TRANSDERMAL | Status: DC

## 2015-08-23 NOTE — Progress Notes (Signed)
08/23/2015 13:30  Yvette Martinez to be D/C'd Home per MD order.  Discussed prescriptions and follow up appointments with the patient. Prescriptions given to patient, medication list explained in detail. Pt verbalized understanding.    Medication List    STOP taking these medications        oxyCODONE-acetaminophen 5-325 MG tablet  Commonly known as:  ROXICET     polyethylene glycol packet  Commonly known as:  MIRALAX / GLYCOLAX      TAKE these medications        ADVAIR DISKUS 100-50 MCG/DOSE Aepb  Generic drug:  Fluticasone-Salmeterol  Inhale 1 puff into the lungs 2 (two) times daily.     ALPRAZolam 1 MG tablet  Commonly known as:  XANAX  Take 1 tablet by mouth every 6 (six) hours as needed for anxiety.     antiseptic oral rinse 0.05 % Liqd solution  Commonly known as:  CPC / CETYLPYRIDINIUM CHLORIDE 0.05%  7 mLs by Mouth Rinse route 2 (two) times daily.     bisacodyl 10 MG suppository  Commonly known as:  DULCOLAX  Place 1 suppository (10 mg total) rectally as needed for moderate constipation.     levofloxacin 750 MG tablet  Commonly known as:  LEVAQUIN  Take 1 tablet (750 mg total) by mouth daily.     metroNIDAZOLE 500 MG tablet  Commonly known as:  FLAGYL  Take 1 tablet (500 mg total) by mouth every 8 (eight) hours.     mirtazapine 30 MG tablet  Commonly known as:  REMERON  Take 30 mg by mouth 2 (two) times daily.     nicotine 21 mg/24hr patch  Commonly known as:  NICODERM CQ - dosed in mg/24 hours  Place 1 patch (21 mg total) onto the skin daily.     predniSONE 10 MG (21) Tbpk tablet  Commonly known as:  STERAPRED UNI-PAK 21 TAB  Take 1 tablet (10 mg total) by mouth daily. Please take 6 pills in the morning with breakfast on the day 1, then taper by one pill daily until finished. Thank you.     tiotropium 18 MCG inhalation capsule  Commonly known as:  SPIRIVA  Place 1 capsule into inhaler and inhale daily.     VENTOLIN HFA 108 (90 Base) MCG/ACT inhaler   Generic drug:  albuterol  Inhale 1-2 puffs into the lungs every 6 (six) hours as needed.     albuterol (2.5 MG/3ML) 0.083% nebulizer solution  Commonly known as:  PROVENTIL  Take 3 mLs (2.5 mg total) by nebulization every 4 (four) hours as needed for wheezing or shortness of breath.        Filed Vitals:   08/22/15 2037 08/23/15 0410  BP: 112/68 139/71  Pulse: 94 102  Temp: 98 F (36.7 C) 97.8 F (36.6 C)  Resp: 20 20    Skin clean, dry and intact without evidence of skin break down, no evidence of skin tears noted. IV catheter discontinued intact. Site without signs and symptoms of complications. Dressing and pressure applied. Pt denies pain at this time. No complaints noted.  An After Visit Summary was printed and given to the patient. Patient escorted via WC, and D/C home via private auto.  Bradly Chrisougherty, Shandora Koogler E

## 2015-08-23 NOTE — Care Management Note (Signed)
Case Management Note  Patient Details  Name: Yvette Martinez MRN: [id number] Date of Birth: September 01, 1950  Subjective/Objective:     Ms [name] chose [facility] to be her home health provider with CM last week. A referral for home health PT, RN, Aide, and SW was faxed to [facility]. A portable oxygen tank and a RW were delivered to Ms [name]'s hospital room yesterday.                Action/Plan:   Expected Discharge Date:                  Expected Discharge Plan:     In-House Referral:     Discharge planning Services  [facility], Medication Assistance  Post Acute Care Choice:    Choice offered to:     DME Arranged:    DME Agency:     HH Arranged:    HH Agency:     Status of Service:  Completed, signed off  If discussed at [employer] Length of Stay Meetings, dates discussed:    Additional Comments:  Kristian Mogg A, RN 08/23/2015, 12:56 PM

## 2015-08-23 NOTE — Discharge Summary (Signed)
[facility] - [place] at [facility]   PATIENT NAME: Yvette Martinez    MR#:  [id number]  DATE OF BIRTH:  [birth year]  DATE OF ADMISSION:  08/19/2015 ADMITTING PHYSICIAN: Joella Prince, MD  DATE OF DISCHARGE: No discharge date for patient encounter.  PRIMARY CARE PHYSICIAN: Pasty Spillers McLean-Scocozza, MD     ADMISSION DIAGNOSIS:  Community acquired pneumonia [J18.9] Hypoxia [R09.02] Sepsis, due to unspecified organism (HCC) [A41.9] Hypotension, unspecified hypotension type [I95.9]  DISCHARGE DIAGNOSIS:  Principal Problem:   CAP (community acquired pneumonia) Active Problems:   Acute respiratory failure with hypoxia (HCC)   COPD exacerbation (HCC)   Diverticulitis of colon   Diarrhea   Generalized weakness   Tobacco abuse   SECONDARY DIAGNOSIS:   Past Medical History  Diagnosis Date  . Diverticula of colon   . COPD (chronic obstructive pulmonary disease) (HCC)     .pro HOSPITAL COURSE:   Patient is 65 year old Caucasian female with past medical history significant for history of COPD, colon diverticulosis, who presents to the hospital with complaints of generalized weakness, decreased appetite, worsening shortness of breath, wheezing, productive clear sputum, frequent falls, worsening over the past one week. She also reported diarrhea, subjective fevers, pleuritic chest pain. On arrival to the hospital patient was noted to be hypoxic with oxygen saturations of 87% on room air. Chest x-ray showed small left pleural effusion, bibasilar airspace opacities, concerning for pneumonia or mild interstitial edema. CT of abdomen and pelvis with contrast revealed dense focal by basilar airspace opacities compatible with multifocal pneumonia, wall thickening along proximal sigmoid colon concerning for diverticulitis, diverticulosis was also noted. Patient was admitted to the hospital for further evaluation. She was initiated on broad-spectrum antibiotic therapy,  nebulizers, steroids and her condition improved. Patient's cultures revealed no growth from stool or blood, however, sputum culture showed Streptococcus pneumonia sensitive to all antibiotics including ceftriaxone, erythromycin, levofloxacin, penicillin. Patient was managed on levofloxacin and Flagyl. She was seen by physical and occupational therapist, however, refused skilled nursing facility placement for rehabilitation, decided to go home with home health. We tried to wean her oxygen, however, not able to do while in the hospital. Discussion by problem: #1. Acute respiratory failure with hypoxia, due to pneumonia and COPD exacerbation, continue prednisone taper, antibiotic therapy for 5 days to complete course, continue nebulizers, oxygen therapy. Patient is to qualify for oxygen therapy at home. Patient will be leaving to home today with home health #2. Bacterial pneumonia due to Streptococcus pneumonia, continue antibiotic therapy orally to complete course #3. Sigmoid diverticulitis, resolved, continue levofloxacin and Flagyl for 5 more days to complete course #4. Dysphagia, suspected due to cough and shortness of breath, patient did develop a swallowing evaluation, now she is on normal diet. MRI of brain was done while in the hospital and it was normal #5. Tobacco abuse, discussed this patient for 5 minutes, nicotine replacement therapy was initiated #6. Hyponatremia, resolved, possibly dehydration related #7. Generalized weakness, patient is to continue physical therapy at home, refused rehabilitation placement DISCHARGE CONDITIONS:   Stable  CONSULTS OBTAINED:     DRUG ALLERGIES:   Allergies  Allergen Reactions  . Sulfa Antibiotics Hives    DISCHARGE MEDICATIONS:   Current Discharge Medication List    START taking these medications   Details  albuterol (PROVENTIL) (2.5 MG/3ML) 0.083% nebulizer solution Take 3 mLs (2.5 mg total) by nebulization every 4 (four) hours as needed for  wheezing or shortness of breath. Qty: 75 mL, Refills: 12  antiseptic oral rinse (CPC / CETYLPYRIDINIUM CHLORIDE 0.05%) 0.05 % LIQD solution 7 mLs by Mouth Rinse route 2 (two) times daily. Qty: 118 mL, Refills: 0    levofloxacin (LEVAQUIN) 750 MG tablet Take 1 tablet (750 mg total) by mouth daily. Qty: 5 tablet, Refills: 0    nicotine (NICODERM CQ - DOSED IN MG/24 HOURS) 21 mg/24hr patch Place 1 patch (21 mg total) onto the skin daily. Qty: 28 patch, Refills: 0    predniSONE (STERAPRED UNI-PAK 21 TAB) 10 MG (21) TBPK tablet Take 1 tablet (10 mg total) by mouth daily. Please take 6 pills in the morning with breakfast on the day 1, then taper by one pill daily until finished. Thank you. Qty: 21 tablet, Refills: 0      CONTINUE these medications which have CHANGED   Details  metroNIDAZOLE (FLAGYL) 500 MG tablet Take 1 tablet (500 mg total) by mouth every 8 (eight) hours. Qty: 15 tablet, Refills: 0      CONTINUE these medications which have NOT CHANGED   Details  ALPRAZolam (XANAX) 1 MG tablet Take 1 tablet by mouth every 6 (six) hours as needed for anxiety.     Fluticasone-Salmeterol (ADVAIR DISKUS) 100-50 MCG/DOSE AEPB Inhale 1 puff into the lungs 2 (two) times daily.    mirtazapine (REMERON) 30 MG tablet Take 30 mg by mouth 2 (two) times daily.     tiotropium (SPIRIVA) 18 MCG inhalation capsule Place 1 capsule into inhaler and inhale daily.    VENTOLIN HFA 108 (90 Base) MCG/ACT inhaler Inhale 1-2 puffs into the lungs every 6 (six) hours as needed.     bisacodyl (DULCOLAX) 10 MG suppository Place 1 suppository (10 mg total) rectally as needed for moderate constipation. Qty: 12 suppository, Refills: 0      STOP taking these medications     oxyCODONE-acetaminophen (ROXICET) 5-325 MG tablet      polyethylene glycol (MIRALAX / GLYCOLAX) packet          DISCHARGE INSTRUCTIONS:    Patient is to follow-up with primary care physician as outpatient, wean off oxygen as  tolerated, keeping pulse oximetry at 90-92% on room air at rest as well as on exertion  If you experience worsening of your admission symptoms, develop shortness of breath, life threatening emergency, suicidal or homicidal thoughts you must seek medical attention immediately by calling 911 or calling your MD immediately  if symptoms less severe.  You Must read complete instructions/literature along with all the possible adverse reactions/side effects for all the Medicines you take and that have been prescribed to you. Take any new Medicines after you have completely understood and accept all the possible adverse reactions/side effects.   Please note  You were cared for by a hospitalist during your hospital stay. If you have any questions about your discharge medications or the care you received while you were in the hospital after you are discharged, you can call the unit and asked to speak with the hospitalist on call if the hospitalist that took care of you is not available. Once you are discharged, your primary care physician will handle any further medical issues. Please note that NO REFILLS for any discharge medications will be authorized once you are discharged, as it is imperative that you return to your primary care physician (or establish a relationship with a primary care physician if you do not have one) for your aftercare needs so that they can reassess your need for medications and monitor your lab values.  Today   CHIEF COMPLAINT:   Chief Complaint  Patient presents with  . Weakness  . Diarrhea    HISTORY OF PRESENT ILLNESS:  Yvette Martinez  is a 65 y.o. female with a known history of COPD, colon diverticulosis, who presents to the hospital with complaints of generalized weakness, decreased appetite, worsening shortness of breath, wheezing, productive clear sputum, frequent falls, worsening over the past one week. She also reported diarrhea, subjective fevers, pleuritic chest  pain. On arrival to the hospital patient was noted to be hypoxic with oxygen saturations of 87% on room air. Chest x-ray showed small left pleural effusion, bibasilar airspace opacities, concerning for pneumonia or mild interstitial edema. CT of abdomen and pelvis with contrast revealed dense focal by basilar airspace opacities compatible with multifocal pneumonia, wall thickening along proximal sigmoid colon concerning for diverticulitis, diverticulosis was also noted. Patient was admitted to the hospital for further evaluation. She was initiated on broad-spectrum antibiotic therapy, nebulizers, steroids and her condition improved. Patient's cultures revealed no growth from stool or blood, however, sputum culture showed Streptococcus pneumonia sensitive to all antibiotics including ceftriaxone, erythromycin, levofloxacin, penicillin. Patient was managed on levofloxacin and Flagyl. She was seen by physical and occupational therapist, however, refused skilled nursing facility placement for rehabilitation, decided to go home with home health. We tried to wean her oxygen, however, not able to do while in the hospital. Discussion by problem: #1. Acute respiratory failure with hypoxia, due to pneumonia and COPD exacerbation, continue prednisone taper, antibiotic therapy for 5 days to complete course, continue nebulizers, oxygen therapy. Patient is to qualify for oxygen therapy at home. Patient will be leaving to home today with home health #2. Bacterial pneumonia due to Streptococcus pneumonia, continue antibiotic therapy orally to complete course #3. Sigmoid diverticulitis, resolved, continue levofloxacin and Flagyl for 5 more days to complete course #4. Dysphagia, suspected due to cough and shortness of breath, patient did develop a swallowing evaluation, now she is on normal diet. MRI of brain was done while in the hospital and it was normal #5. Tobacco abuse, discussed this patient for 5 minutes, nicotine  replacement therapy was initiated #6. Hyponatremia, resolved, possibly dehydration related #7. Generalized weakness, patient is to continue physical therapy at home, refused rehabilitation placement   VITAL SIGNS:  Blood pressure 139/71, pulse 102, temperature 97.8 F (36.6 C), temperature source Oral, resp. rate 20, height 5\' 2"  (1.575 m), weight 56.7 kg (125 lb), SpO2 93 %.  I/O:   Intake/Output Summary (Last 24 hours) at 08/23/15 1232 Last data filed at 08/23/15 0900  Gross per 24 hour  Intake    480 ml  Output      0 ml  Net    480 ml    PHYSICAL EXAMINATION:  GENERAL:  65 y.o.-year-old patient lying in the bed with no acute distress.  EYES: Pupils equal, round, reactive to light and accommodation. No scleral icterus. Extraocular muscles intact.  HEENT: Head atraumatic, normocephalic. Oropharynx and nasopharynx clear.  NECK:  Supple, no jugular venous distention. No thyroid enlargement, no tenderness.  LUNGS: Normal breath sounds bilaterally, no wheezing, rales,rhonchi or crepitation. No use of accessory muscles of respiration.  CARDIOVASCULAR: S1, S2 normal. No murmurs, rubs, or gallops.  ABDOMEN: Soft, non-tender, non-distended. Bowel sounds present. No organomegaly or mass.  EXTREMITIES: No pedal edema, cyanosis, or clubbing.  NEUROLOGIC: Cranial nerves II through XII are intact. Muscle strength 5/5 in all extremities. Sensation intact. Gait not checked.  PSYCHIATRIC: The patient is alert and oriented  x 3.  SKIN: No obvious rash, lesion, or ulcer.   DATA REVIEW:   CBC  Recent Labs Lab 08/21/15 0632  WBC 13.5*  HGB 11.9*  HCT 34.5*  PLT 195    Chemistries   Recent Labs Lab 08/19/15 2155 08/21/15 0632  08/23/15 0425  NA 134* 139  < > 138  K 3.6 2.8*  < > 3.8  CL 95* 102  < > 99*  CO2 26 28  < > 29  GLUCOSE 137* 73  < > 154*  BUN 29* 10  < > 13  CREATININE 0.99 0.64  < > 0.73  CALCIUM 8.7* 7.9*  < > 8.7*  MG  --  2.0  --   --   AST 24  --   --   --    ALT 14  --   --   --   ALKPHOS 97  --   --   --   BILITOT 0.8  --   --   --   < > = values in this interval not displayed.  Cardiac Enzymes  Recent Labs Lab 08/19/15 2155  TROPONINI 0.03    Microbiology Results  Results for orders placed or performed during the hospital encounter of 08/19/15  Culture, sputum-assessment     Status: None   Collection Time: 08/19/15 10:50 PM  Result Value Ref Range Status   Specimen Description SPUTUM  Final   Special Requests NONE  Final   Sputum evaluation THIS SPECIMEN IS ACCEPTABLE FOR SPUTUM CULTURE  Final   Report Status 08/20/2015 FINAL  Final  Culture, respiratory (NON-Expectorated)     Status: None   Collection Time: 08/19/15 10:50 PM  Result Value Ref Range Status   Specimen Description SPUTUM  Final   Special Requests NONE Reflexed from [id number]  Final   Gram Stain   Final    FEW WBC PRESENT, PREDOMINANTLY PMN RARE SQUAMOUS EPITHELIAL CELLS PRESENT ABUNDANT GRAM POSITIVE COCCI IN PAIRS FEW GRAM POSITIVE RODS Performed at [facility]    Culture ABUNDANT STREPTOCOCCUS PNEUMONIAE  Final   Report Status 08/22/2015 FINAL  Final   Organism ID, Bacteria STREPTOCOCCUS PNEUMONIAE  Final      Susceptibility   Streptococcus pneumoniae - MIC*    ERYTHROMYCIN <=0.12 SENSITIVE Sensitive     LEVOFLOXACIN 1 SENSITIVE Sensitive     PENICILLIN Value in next row Sensitive      SENSITIVE<=0.06    CEFTRIAXONE Value in next row Sensitive      SENSITIVE<=0.12    * ABUNDANT STREPTOCOCCUS PNEUMONIAE  Blood Culture (routine x 2)     Status: None (Preliminary result)   Collection Time: 08/19/15 11:32 PM  Result Value Ref Range Status   Specimen Description BLOOD RIGHT HAND  Final   Special Requests   Final    BOTTLES DRAWN AEROBIC AND ANAEROBIC  10CCAERO,10CCANA   Culture NO GROWTH 4 DAYS  Final   Report Status PENDING  Incomplete  Blood Culture (routine x 2)     Status: None (Preliminary result)   Collection Time: 08/19/15 11:32 PM   Result Value Ref Range Status   Specimen Description BLOOD LEFT HAND  Final   Special Requests   Final    BOTTLES DRAWN AEROBIC AND ANAEROBIC  8CCAERO, 6CCANA   Culture NO GROWTH 4 DAYS  Final   Report Status PENDING  Incomplete  Urine culture     Status: None   Collection Time: 08/19/15 11:32 PM  Result Value Ref Range  Status   Specimen Description URINE, RANDOM  Final   Special Requests NONE  Final   Culture NO GROWTH [facility]   Final   Report Status 08/21/2015 FINAL  Final  C difficile quick scan w PCR reflex     Status: None   Collection Time: 08/20/15  8:59 AM  Result Value Ref Range Status   C Diff antigen NEGATIVE NEGATIVE Final   C Diff toxin NEGATIVE NEGATIVE Final   C Diff interpretation Negative for C. difficile  Final  Gastrointestinal Panel by PCR , Stool     Status: None   Collection Time: 08/20/15  8:59 AM  Result Value Ref Range Status   Campylobacter species NOT DETECTED NOT DETECTED Final   Plesimonas shigelloides NOT DETECTED NOT DETECTED Final   Salmonella species NOT DETECTED NOT DETECTED Final   Yersinia enterocolitica NOT DETECTED NOT DETECTED Final   Vibrio species NOT DETECTED NOT DETECTED Final   Vibrio cholerae NOT DETECTED NOT DETECTED Final   Enteroaggregative E coli (EAEC) NOT DETECTED NOT DETECTED Final   Enteropathogenic E coli (EPEC) NOT DETECTED NOT DETECTED Final   Enterotoxigenic E coli (ETEC) NOT DETECTED NOT DETECTED Final   Shiga like toxin producing E coli (STEC) NOT DETECTED NOT DETECTED Final   E. coli O157 NOT DETECTED NOT DETECTED Final   Shigella/Enteroinvasive E coli (EIEC) NOT DETECTED NOT DETECTED Final   Cryptosporidium NOT DETECTED NOT DETECTED Final   Cyclospora cayetanensis NOT DETECTED NOT DETECTED Final   Entamoeba histolytica NOT DETECTED NOT DETECTED Final   Giardia lamblia NOT DETECTED NOT DETECTED Final   Adenovirus F40/41 NOT DETECTED NOT DETECTED Final   Astrovirus NOT DETECTED NOT DETECTED  Final   Norovirus GI/GII NOT DETECTED NOT DETECTED Final   Rotavirus A NOT DETECTED NOT DETECTED Final   Sapovirus (I, II, IV, and V) NOT DETECTED NOT DETECTED Final  Culture, expectorated sputum-assessment     Status: None   Collection Time: 08/21/15  2:10 AM  Result Value Ref Range Status   Specimen Description EXPECTORATED SPUTUM  Final   Special Requests NONE  Final   Sputum evaluation THIS SPECIMEN IS ACCEPTABLE FOR SPUTUM CULTURE  Final   Report Status 08/21/2015 FINAL  Final  Culture, respiratory (NON-Expectorated)     Status: None   Collection Time: 08/21/15  2:10 AM  Result Value Ref Range Status   Specimen Description EXPECTORATED SPUTUM  Final   Special Requests NONE Reflexed from [id number]  Final   Gram Stain   Final    ABUNDANT WBC PRESENT,BOTH PMN AND MONONUCLEAR RARE GRAM VARIABLE ROD    Culture   Final    Consistent with normal respiratory flora. Performed [facility]    Report Status 08/23/2015 FINAL  Final    RADIOLOGY:  No results found.  EKG:   Orders placed or performed during the hospital encounter of 08/19/15  . ED EKG  . ED EKG  . ED EKG 12-Lead  . ED EKG 12-Lead      Management plans discussed with the patient, family and they are in agreement.  CODE STATUS:     Code Status Orders        Start     Ordered   08/20/15 0140  Full code   Continuous     08/20/15 0141    Code Status History    Date Active Date Inactive Code Status Order ID Comments User Context   08/20/2015  1:41 AM  Full  Code [id number]  Joella Prince, MD ED      TOTAL TIME TAKING CARE OF THIS PATIENT: 40 minutes.    Katharina Caper M.D on 08/23/2015 at 12:32 PM  Between 7am to 6pm - Pager - [phone]  After 6pm go to [email or web address] - password [email or web address] [facility]  [facility]  Office  [phone]  CC: Primary care physician; Loman Brooklyn, MD

## 2015-08-23 NOTE — Progress Notes (Signed)
i instructed patient on how to use flutter device. Tolerated well

## 2015-08-24 LAB — CULTURE, BLOOD (ROUTINE X 2)
CULTURE: NO GROWTH
CULTURE: NO GROWTH

## 2015-08-25 NOTE — Progress Notes (Signed)
[facility]  Patient Status[facility]. Episode closed. CM notified.    If patient discharges after hours, please call [phone].   Dimple CaseyJason E Hinton 08/25/2015, 9:24 AM

## 2016-07-07 ENCOUNTER — Emergency Department: Payer: Medicare HMO

## 2016-07-07 ENCOUNTER — Inpatient Hospital Stay
Admission: EM | Admit: 2016-07-07 | Discharge: 2016-07-10 | DRG: 392 | Disposition: A | Discharge status: Home / Self Care | Payer: Medicare HMO | Attending: Internal Medicine | Admitting: Internal Medicine

## 2016-07-07 ENCOUNTER — Encounter: Payer: Self-pay | Admitting: Emergency Medicine

## 2016-07-07 DIAGNOSIS — E162 Hypoglycemia, unspecified: Secondary | ICD-10-CM | POA: Diagnosis not present

## 2016-07-07 DIAGNOSIS — Z90710 Acquired absence of both cervix and uterus: Secondary | ICD-10-CM

## 2016-07-07 DIAGNOSIS — K5720 Diverticulitis of large intestine with perforation and abscess without bleeding: Secondary | ICD-10-CM | POA: Diagnosis present

## 2016-07-07 DIAGNOSIS — J449 Chronic obstructive pulmonary disease, unspecified: Secondary | ICD-10-CM | POA: Diagnosis present

## 2016-07-07 DIAGNOSIS — E876 Hypokalemia: Secondary | ICD-10-CM | POA: Diagnosis present

## 2016-07-07 DIAGNOSIS — I700 Atherosclerosis of aorta: Secondary | ICD-10-CM | POA: Diagnosis present

## 2016-07-07 DIAGNOSIS — E878 Other disorders of electrolyte and fluid balance, not elsewhere classified: Secondary | ICD-10-CM | POA: Diagnosis present

## 2016-07-07 DIAGNOSIS — Z882 Allergy status to sulfonamides status: Secondary | ICD-10-CM

## 2016-07-07 DIAGNOSIS — Z23 Encounter for immunization: Secondary | ICD-10-CM | POA: Diagnosis present

## 2016-07-07 DIAGNOSIS — K5792 Diverticulitis of intestine, part unspecified, without perforation or abscess without bleeding: Secondary | ICD-10-CM | POA: Diagnosis not present

## 2016-07-07 DIAGNOSIS — E871 Hypo-osmolality and hyponatremia: Secondary | ICD-10-CM | POA: Diagnosis present

## 2016-07-07 DIAGNOSIS — Z7951 Long term (current) use of inhaled steroids: Secondary | ICD-10-CM | POA: Diagnosis not present

## 2016-07-07 DIAGNOSIS — F17200 Nicotine dependence, unspecified, uncomplicated: Secondary | ICD-10-CM | POA: Diagnosis present

## 2016-07-07 DIAGNOSIS — R310 Gross hematuria: Secondary | ICD-10-CM | POA: Diagnosis present

## 2016-07-07 DIAGNOSIS — F329 Major depressive disorder, single episode, unspecified: Secondary | ICD-10-CM

## 2016-07-07 DIAGNOSIS — N390 Urinary tract infection, site not specified: Secondary | ICD-10-CM | POA: Diagnosis present

## 2016-07-07 DIAGNOSIS — A020 Salmonella enteritis: Secondary | ICD-10-CM | POA: Diagnosis present

## 2016-07-07 DIAGNOSIS — F419 Anxiety disorder, unspecified: Secondary | ICD-10-CM | POA: Diagnosis present

## 2016-07-07 DIAGNOSIS — F32A Depression, unspecified: Secondary | ICD-10-CM

## 2016-07-07 DIAGNOSIS — R197 Diarrhea, unspecified: Secondary | ICD-10-CM

## 2016-07-07 DIAGNOSIS — E46 Unspecified protein-calorie malnutrition: Secondary | ICD-10-CM | POA: Diagnosis present

## 2016-07-07 LAB — URINALYSIS, COMPLETE (UACMP) WITH MICROSCOPIC
BILIRUBIN URINE: NEGATIVE
Glucose, UA: NEGATIVE mg/dL
Ketones, ur: 20 mg/dL — AB
Nitrite: NEGATIVE
PH: 6 (ref 5.0–8.0)
Protein, ur: 100 mg/dL — AB
Specific Gravity, Urine: 1.02 (ref 1.005–1.030)

## 2016-07-07 LAB — COMPREHENSIVE METABOLIC PANEL
ALT: 18 U/L (ref 14–54)
ANION GAP: 11 (ref 5–15)
AST: 34 U/L (ref 15–41)
Albumin: 3.1 g/dL — ABNORMAL LOW (ref 3.5–5.0)
Alkaline Phosphatase: 78 U/L (ref 38–126)
BUN: 17 mg/dL (ref 6–20)
CO2: 33 mmol/L — AB (ref 22–32)
Calcium: 8.5 mg/dL — ABNORMAL LOW (ref 8.9–10.3)
Chloride: 86 mmol/L — ABNORMAL LOW (ref 101–111)
Creatinine, Ser: 0.71 mg/dL (ref 0.44–1.00)
GFR calc non Af Amer: >60 mL/min (ref >60)
Glucose, Bld: 124 mg/dL — ABNORMAL HIGH (ref 65–99)
Potassium: 2.4 mmol/L — CL (ref 3.5–5.1)
SODIUM: 130 mmol/L — AB (ref 135–145)
Total Bilirubin: 0.8 mg/dL (ref 0.3–1.2)
Total Protein: 6.8 g/dL (ref 6.5–8.1)

## 2016-07-07 LAB — CBC
HCT: 38.6 % (ref 35.0–47.0)
HEMOGLOBIN: 13.8 g/dL (ref 12.0–16.0)
MCH: 33.9 pg (ref 26.0–34.0)
MCHC: 35.8 g/dL (ref 32.0–36.0)
MCV: 94.5 fL (ref 80.0–100.0)
Platelets: 232 10*3/uL (ref 150–440)
RBC: 4.08 MIL/uL (ref 3.80–5.20)
RDW: 12.8 % (ref 11.5–14.5)
WBC: 9.8 10*3/uL (ref 3.6–11.0)

## 2016-07-07 LAB — LIPASE, BLOOD: Lipase: 22 U/L (ref 11–51)

## 2016-07-07 MED ORDER — SODIUM CHLORIDE 0.9 % IV SOLN
Freq: Once | INTRAVENOUS | Status: AC
  Administered 2016-07-07: 23:00:00 via INTRAVENOUS

## 2016-07-07 MED ORDER — POTASSIUM CHLORIDE CRYS ER 20 MEQ PO TBCR
40.0000 meq | EXTENDED_RELEASE_TABLET | Freq: Once | ORAL | Status: AC
Start: 2016-07-07 — End: 2016-07-07
  Administered 2016-07-07: 40 meq via ORAL
  Filled 2016-07-07: qty 2

## 2016-07-07 MED ORDER — SODIUM CHLORIDE 0.9 % IV BOLUS (SEPSIS)
1000.0000 mL | Freq: Once | INTRAVENOUS | Status: AC
  Administered 2016-07-07: 1000 mL via INTRAVENOUS

## 2016-07-07 MED ORDER — IOPAMIDOL (ISOVUE-300) INJECTION 61%
30.0000 mL | Freq: Once | INTRAVENOUS | Status: AC | PRN
  Administered 2016-07-07: 30 mL via ORAL

## 2016-07-07 MED ORDER — PIPERACILLIN-TAZOBACTAM 3.375 G IVPB 30 MIN
3.3750 g | Freq: Once | INTRAVENOUS | Status: AC
  Administered 2016-07-07: 3.375 g via INTRAVENOUS
  Filled 2016-07-07: qty 50

## 2016-07-07 MED ORDER — IOPAMIDOL (ISOVUE-300) INJECTION 61%
100.0000 mL | Freq: Once | INTRAVENOUS | Status: AC | PRN
  Administered 2016-07-07: 100 mL via INTRAVENOUS

## 2016-07-07 NOTE — ED Notes (Signed)
Introduced self to patient and friend at bedside. Pt ambulated to toilet with 2 assists.

## 2016-07-07 NOTE — ED Triage Notes (Signed)
C/O diarrhea and hematuria x 2 weeks.

## 2016-07-07 NOTE — ED Notes (Signed)
Pt is to wear o2 at home, arrives on room air, o2 sat 88%, pt placed on 2L 

## 2016-07-07 NOTE — Consult Note (Signed)
Patient ID: Yvette Martinez, female   DOB: 03-17-1950, 66 y.o.   MRN: [id number]  HPI Yvette Martinez is a 66 y.o. female asked to see in consultation by Dr. Derrill Kay. She reports a 2 week history of diarrhea, weakness, hematuria and over the last week and abdominal pain. She reports that her abdominal pain is intermittent is moderate in intensity and located in the left lower quadrant. There is no specific aggravating factors. The pain got better with some narcotics she was given in the ER. As far as her hematuria and she has been having this for the last 2 weeks. She does have decreased appetite. No fevers no chills. She denies any recent antibiotic use. She is a smoker and has a history of COPD. Flaccid she was hospitalized for a double pneumonia and COPD exacerbation as well as a diverticulitis episode. She has never had a colonoscopy in the past. CT scan personal review there is evidence of inflammatory response around the sigmoid colon with a couple of bubbles of extraluminal air consistent with diverticulitis and microperforation. There is no overt free air. There is no abscess. There is also some inflammatory response around the bladder. Her white count was normal and her electrolytes show severe hypokalemia, hypochloremia and hyponatremia. She also has significant back pain and takes significant NSAIDs  HPI  Past Medical History:  Diagnosis Date  . COPD (chronic obstructive pulmonary disease) (HCC)   . Diverticula of colon     Past Surgical History:  Procedure Laterality Date  . ABDOMINAL HYSTERECTOMY      Fam Hx non contributory  Social History Social History  Substance Use Topics  . Smoking status: Current Every Day Smoker  . Smokeless tobacco: Never Used  . Alcohol use Not on file    Allergies  Allergen Reactions  . Sulfa Antibiotics Hives    No current facility-administered medications for this encounter.    Current Outpatient Prescriptions  Medication Sig Dispense  Refill  . ALPRAZolam (XANAX) 1 MG tablet Take 1 tablet by mouth every 6 (six) hours as needed for anxiety.     Marland Kitchen antiseptic oral rinse (CPC / CETYLPYRIDINIUM CHLORIDE 0.05%) 0.05 % LIQD solution 7 mLs by Mouth Rinse route 2 (two) times daily. 118 mL 0  . Fluticasone-Salmeterol (ADVAIR DISKUS) 100-50 MCG/DOSE AEPB Inhale 1 puff into the lungs 2 (two) times daily.    Marland Kitchen menthol-cetylpyridinium (CEPACOL) 3 MG lozenge Take 1 lozenge by mouth as needed for sore throat.    . mirtazapine (REMERON) 30 MG tablet Take 30 mg by mouth 2 (two) times daily.     Marland Kitchen tiotropium (SPIRIVA) 18 MCG inhalation capsule Place 1 capsule into inhaler and inhale daily.    . VENTOLIN HFA 108 (90 Base) MCG/ACT inhaler Inhale 1-2 puffs into the lungs every 6 (six) hours as needed.     Marland Kitchen albuterol (PROVENTIL) (2.5 MG/3ML) 0.083% nebulizer solution Take 3 mLs (2.5 mg total) by nebulization every 4 (four) hours as needed for wheezing or shortness of breath. (Patient not taking: Reported on 07/07/2016) 75 mL 12  . bisacodyl (DULCOLAX) 10 MG suppository Place 1 suppository (10 mg total) rectally as needed for moderate constipation. (Patient not taking: Reported on 08/19/2015) 12 suppository 0  . levofloxacin (LEVAQUIN) 750 MG tablet Take 1 tablet (750 mg total) by mouth daily. (Patient not taking: Reported on 07/07/2016) 5 tablet 0  . metroNIDAZOLE (FLAGYL) 500 MG tablet Take 1 tablet (500 mg total) by mouth every 8 (eight) hours. (Patient not  taking: Reported on 07/07/2016) 15 tablet 0  . nicotine (NICODERM CQ - DOSED IN MG/24 HOURS) 21 mg/24hr patch Place 1 patch (21 mg total) onto the skin daily. (Patient not taking: Reported on 07/07/2016) 28 patch 0  . predniSONE (STERAPRED UNI-PAK 21 TAB) 10 MG (21) TBPK tablet Take 1 tablet (10 mg total) by mouth daily. Please take 6 pills in the morning with breakfast on the day 1, then taper by one pill daily until finished. Thank you. (Patient not taking: Reported on 07/07/2016) 21 tablet 0     Review  of Systems Full ROS  was asked and was negative except for the information on the HPI  Physical Exam Blood pressure 127/73, pulse 88, temperature 99.1 F (37.3 C), temperature source Oral, resp. rate 17, height 5' (1.524 m), weight 63 kg (139 lb), SpO2 98 %. CONSTITUTIONAL: Debilitated and malnourished female EYES: Pupils are equal, round, and reactive to light, Sclera are non-icteric. EARS, NOSE, MOUTH AND THROAT: The oropharynx is clear. The oral mucosa is pink and moist. Hearing is intact to voice. LYMPH NODES:  Lymph nodes in the neck are normal. RESPIRATORY:  Lungs are clear. There is normal respiratory effort, with equal breath sounds bilaterally, and without pathologic use of accessory muscles. CARDIOVASCULAR: Heart is regular without murmurs, gallops, or rubs. GI: The abdomen is  soft, TTP LLQ, no peritonitis or rebound. GU: Rectal deferred.   MUSCULOSKELETAL: Normal muscle strength and tone. No cyanosis or edema.   SKIN: Turgor is good and there are no pathologic skin lesions or ulcers. NEUROLOGIC: Motor and sensation is grossly normal. Cranial nerves are grossly intact. PSYCH:  Oriented to person, place and time. Affect is normal.  Data Reviewed  I have personally reviewed the patient's imaging, laboratory findings and medical records.    Assessment/ Plan 66 year old female with multiple comorbidities including COPD, tobacco back pain and malnutrition presents with diarrhea abdominal pain and hematuria consistent with diverticulitis but I do think that there might be a difficult one related to her hematuria and her diarrhea. I do recommend admission to the hospital with IV antibiotics, keep the patient nothing by mouth, replacement of lytes and dehydration. She needs furhter workup for her  hematuria by urology  ( colovesical fistula can also explain finding) and also C. difficile for possible Clostridium colitis. No need for emergent surgical intervention at this time.I  Had a  lengthy discussion with the patient and apparently she wishes to go home. I will leave that decision up to Dr. Hoy FinlayWoodman in the emergency room. I firmly believe that this patient needs to be admitted for further workup and also treatment. I do think that the best service is in trauma medicine that they can coordinate for the workup and provide adequate support for her multiple comorbidities and electrolyte abnormalities. If she is admitted we will be happy to follow her on a daily basis. Discussed with her that she will definitely needs further workup for a diverticulitis in the outpatient setting as well in the form of colonoscopy and she will benefit from elective colectomy at some point in time once her acute issues resolve. D/W Dr. Derrill KayGoodman in detail  Sterling Bigiego Pabon, MD FACS General Surgeon 07/07/2016, 10:41 PM

## 2016-07-07 NOTE — ED Notes (Signed)
Patient transported to CT 

## 2016-07-07 NOTE — ED Provider Notes (Signed)
[facility] Emergency Department Provider Note   ____________________________________________   I have reviewed the triage vital signs and the nursing notes.   HISTORY  Chief Complaint Diarrhea and Hematuria   History limited by: Not Limited   HPI Yvette Martinez is a 66 y.o. female who presents to the emergency department today because of continued diarrhea and feeling unwell. The patient states that she has had diarrhea for the past two weeks. It has been constant. She has had accompanied decrease in appetite. She states that in addition to the diarrhea she has also noticed bloody urination. This has happened to her in the past, however she never got an answer as to why she had it. The patient denies any fevers. No chest pain or shortness of breath.    Past Medical History:  Diagnosis Date  . COPD (chronic obstructive pulmonary disease) (HCC)   . Diverticula of colon     Patient Active Problem List   Diagnosis Date Noted  . Acute respiratory failure with hypoxia (HCC) 08/23/2015  . COPD exacerbation (HCC) 08/23/2015  . Diverticulitis of colon 08/23/2015  . Diarrhea 08/23/2015  . Generalized weakness 08/23/2015  . Tobacco abuse 08/23/2015  . CAP (community acquired pneumonia) 08/20/2015    Past Surgical History:  Procedure Laterality Date  . ABDOMINAL HYSTERECTOMY      Prior to Admission medications   Medication Sig Start Date End Date Taking? Authorizing Provider  albuterol (PROVENTIL) (2.5 MG/3ML) 0.083% nebulizer solution Take 3 mLs (2.5 mg total) by nebulization every 4 (four) hours as needed for wheezing or shortness of breath. 08/23/15   Katharina Caper, MD  ALPRAZolam Prudy Feeler) 1 MG tablet Take 1 tablet by mouth every 6 (six) hours as needed for anxiety.     [provider]  antiseptic oral rinse (CPC / CETYLPYRIDINIUM CHLORIDE 0.05%) 0.05 % LIQD solution 7 mLs by Mouth Rinse route 2 (two) times daily. 08/23/15   Katharina Caper, MD   bisacodyl (DULCOLAX) 10 MG suppository Place 1 suppository (10 mg total) rectally as needed for moderate constipation. Patient not taking: Reported on 08/19/2015 04/17/15   Emily Filbert, MD  Fluticasone-Salmeterol (ADVAIR DISKUS) 100-50 MCG/DOSE AEPB Inhale 1 puff into the lungs 2 (two) times daily. 06/28/14   [provider]  levofloxacin (LEVAQUIN) 750 MG tablet Take 1 tablet (750 mg total) by mouth daily. 08/23/15   Katharina Caper, MD  metroNIDAZOLE (FLAGYL) 500 MG tablet Take 1 tablet (500 mg total) by mouth every 8 (eight) hours. 08/23/15   Katharina Caper, MD  mirtazapine (REMERON) 30 MG tablet Take 30 mg by mouth 2 (two) times daily.     [provider]  nicotine (NICODERM CQ - DOSED IN MG/24 HOURS) 21 mg/24hr patch Place 1 patch (21 mg total) onto the skin daily. 08/23/15   Katharina Caper, MD  predniSONE (STERAPRED UNI-PAK 21 TAB) 10 MG (21) TBPK tablet Take 1 tablet (10 mg total) by mouth daily. Please take 6 pills in the morning with breakfast on the day 1, then taper by one pill daily until finished. Thank you. 08/23/15   Katharina Caper, MD  tiotropium (SPIRIVA) 18 MCG inhalation capsule Place 1 capsule into inhaler and inhale daily. 06/28/14   [provider]  VENTOLIN HFA 108 (90 Base) MCG/ACT inhaler Inhale 1-2 puffs into the lungs every 6 (six) hours as needed.  03/26/15   [provider]    Allergies Sulfa antibiotics  No family history on file.  Social History Social History  Substance Use Topics  . Smoking status: Current Every Day Smoker  . Smokeless tobacco: Never Used  . Alcohol use Not on file    Review of Systems Constitutional: No fever/chills Eyes: No visual changes. ENT: No sore throat. Cardiovascular: Denies chest pain. Respiratory: Denies shortness of breath. Gastrointestinal: Positive for diarrhea. Genitourinary: Negative for dysuria. Musculoskeletal: Negative for back pain. Skin: Negative for rash. Neurological:  Negative for headaches, focal weakness or numbness.  ____________________________________________   PHYSICAL EXAM:  VITAL SIGNS: ED Triage Vitals  Enc Vitals Group     BP 07/07/16 1708 124/74     Pulse Rate 07/07/16 1708 97     Resp 07/07/16 1708 16     Temp 07/07/16 1708 99.1 F (37.3 C)     Temp Source 07/07/16 1708 Oral     SpO2 07/07/16 1654 (!) 88 %     Weight 07/07/16 1711 139 lb (63 kg)     Height 07/07/16 1711 5' (1.524 m)     Head Circumference --      Peak Flow --      Pain Score 07/07/16 1713 10   Constitutional: Alert and oriented. Well appearing and in no distress. Eyes: Conjunctivae are normal. Normal extraocular movements. ENT   Head: Normocephalic and atraumatic.   Nose: No congestion/rhinnorhea.   Mouth/Throat: Mucous membranes are moist.   Neck: No stridor. Hematological/Lymphatic/Immunilogical: No cervical lymphadenopathy. Cardiovascular: Normal rate, regular rhythm.  No murmurs, rubs, or gallops. Respiratory: Normal respiratory effort without tachypnea nor retractions. Breath sounds are clear and equal bilaterally. No wheezes/rales/rhonchi. Gastrointestinal: Soft and slightly tender to the left side of the abdomen. No rebound. No guarding.  Genitourinary: Deferred Musculoskeletal: Normal range of motion in all extremities. No lower extremity edema. Neurologic:  Normal speech and language. No gross focal neurologic deficits are appreciated.  Skin:  Skin is warm, dry and intact. No rash noted. Psychiatric: Mood and affect are normal. Speech and behavior are normal. Patient exhibits appropriate insight and judgment.  ____________________________________________    LABS (pertinent positives/negatives)  Labs Reviewed  COMPREHENSIVE METABOLIC PANEL - Abnormal; Notable for the following:       Result Value   Sodium 130 (*)    Potassium 2.4 (*)    Chloride 86 (*)    CO2 33 (*)    Glucose, Bld 124 (*)    Calcium 8.5 (*)    Albumin 3.1 (*)     All other components within normal limits  URINALYSIS, COMPLETE (UACMP) WITH MICROSCOPIC - Abnormal; Notable for the following:    Color, Urine YELLOW (*)    APPearance HAZY (*)    Hgb urine dipstick MODERATE (*)    Ketones, ur 20 (*)    Protein, ur 100 (*)    Leukocytes, UA SMALL (*)    Bacteria, UA RARE (*)    Squamous Epithelial / LPF 0-5 (*)    All other components within normal limits  LIPASE, BLOOD  CBC     ____________________________________________   EKG  None  ____________________________________________    RADIOLOGY  CT abd/pel IMPRESSION:  1. Inflamed sigmoid colon, likely related to colonic diverticulitis.  2. Micro perforation of the mid sigmoid colon.  3. Inflammation involves the left lateral aspect of the urinary  bladder and the dome of the bladder, possibly accounting for the  patient's hematuria.  4. No free intraperitoneal air or abscess.  5. Status post hysterectomy.  6. Aortic atherosclerosis.      ____________________________________________   PROCEDURES  Procedures  ____________________________________________   INITIAL IMPRESSION / ASSESSMENT AND PLAN / ED COURSE  Pertinent labs & imaging results that were available during my care of the patient were reviewed by me and considered in my medical decision making (see chart for details).  Patient presented to the emergency department today because of concerns for diarrhea and hematuria. On exam patient was tender in the left side. CT scan does show diverticulitis with possible abutment against the bladder causing the hematuria. Given that there was a microperforation surgery was consulted. This point I do not feel surgery is required. Patient will be admitted to the hospital service.  ____________________________________________   FINAL CLINICAL IMPRESSION(S) / ED DIAGNOSES  Final diagnoses:  Diverticulitis  Diarrhea, unspecified type     Note: This dictation was prepared  with Dragon dictation. Any transcriptional errors that result from this process are unintentional     Phineas Semen, MD 07/07/16 2257

## 2016-07-07 NOTE — ED Notes (Signed)
Pt ambulated to toilet in room with 1 assist.

## 2016-07-07 NOTE — H&P (Signed)
History and Physical   [facility] [place] @ [facility] Admission History and Physical [employer], D.O.    Patient Name: Yvette Martinez MR#: [id number] Date of Birth: [birth year] Date of Admission: 07/07/2016  Referring MD/NP/PA: Dr. Derrill Kay Primary Care Physician: McLean-Scocuzza, Pasty Spillers, MD  Chief Complaint:  Chief Complaint  Patient presents with  . Diarrhea  . Hematuria    HPI: Yvette Martinez is a 66 y.o. female with a known history of COPD, diverticulosis, depression presents to the emergency department for evaluation of diarrhea, decreased appetite.  Patient states that she has had multiple courses of antibiotics for diverticulitis in the past 2 years however she has developed ongoing diarrhea for the past 2 weeks becoming worse in the past 4 days which has been watery, foul-smelling and profuse and associated with decreased by mouth intake, decreased appetite. She also reports associated left lower quadrant abdominal pain which is described as crampy. She also reports urinating blood and dysuria for the past 2-3 days. In the emergency department she was found to have diverticulitis with microperforation on CT scan. Surgical consultation recommended admission to medicine with IV antibiotics, nothing by mouth and stool studies as well as a urologic evaluation for possibility of a colovesicular fistula.   Otherwise there has been no change in status. Patient has been taking medication as prescribed and there has been no recent change in medication or diet.  No recent antibiotics.  There has been no recent illness, hospitalizations, travel or sick contacts.    EMS/ED Course: Patient received Zosyn, potassium, normal saline  Review of Systems:  CONSTITUTIONAL: No fever/chills, fatigue, weakness, weight gain/loss, headache. Positive decreased appetite EYES: No blurry or double vision. ENT: No tinnitus, postnasal drip, redness or soreness of the oropharynx. RESPIRATORY: No  cough, dyspnea, wheeze.  No hemoptysis.  CARDIOVASCULAR: No chest pain, palpitations, syncope, orthopnea. No lower extremity edema.  GASTROINTESTINAL: Positive abdominal pain, diarrhea. No nausea, vomiting,constipation.  No hematemesis, melena or hematochezia. GENITOURINARY: No dysuria, frequency. Positive hematuria. ENDOCRINE: No polyuria or nocturia. No heat or cold intolerance. HEMATOLOGY: No anemia, bruising, bleeding. INTEGUMENTARY: No rashes, ulcers, lesions. MUSCULOSKELETAL: No arthritis, gout, dyspnea. NEUROLOGIC: No numbness, tingling, ataxia, seizure-type activity, weakness. PSYCHIATRIC: No anxiety, depression, insomnia.   Past Medical History:  Diagnosis Date  . COPD (chronic obstructive pulmonary disease) (HCC)   . Diverticula of colon   Medical History   Medical History Date Comments  Depression, unspecified    Lung disease    Blood in stool    Mumps    Blood in urine    Measles    Chickenpox    Pleurisy    Nervous breakdown    COPD (chronic obstructive pulmonary disease) , unspecified (CMS-HCC)  stage III  History of anxiety    History of panic attacks    Pulmonary nodules  stable since 2013, deemed benign  Ovarian mass       Past Surgical History:  Procedure Laterality Date  . ABDOMINAL HYSTERECTOMY    Surgical History   Surgery Date Laterality Comments  D&C      TONSILLECTOMY & ADENOIDECTOMY   as a child  TUBAL LIGATION     Exploratory laparotomy and BSO  10/1999  for ovarian mass  TVH 1995 at [facility]        reports that she has been smoking.  She has never used smokeless tobacco. Her alcohol and drug histories are not on file.  Allergies  Allergen Reactions  . Sulfa Antibiotics Hives  Family History   Medical History Relation Name Comments  Diabetes Brother    Heart disease Brother    Heart disease Brother    Alcohol abuse Father  alcoholism  Aneurysm Father    Coronary Artery Disease (Blocked  arteries around heart) Father    Heart disease Father    Coronary Artery Disease (Blocked arteries around heart) Other Positive Hx Uncles  High blood pressure (Hypertension) Other Positive Hx   Osteoporosis (Thinning of bones) Other Positive Hx   Uterine cancer Sister      Prior to Admission medications   Medication Sig Start Date End Date Taking? Authorizing Provider  ALPRAZolam Prudy Feeler) 1 MG tablet Take 1 tablet by mouth every 6 (six) hours as needed for anxiety.    Yes [provider]  antiseptic oral rinse (CPC / CETYLPYRIDINIUM CHLORIDE 0.05%) 0.05 % LIQD solution 7 mLs by Mouth Rinse route 2 (two) times daily. 08/23/15  Yes Katharina Caper, MD  Fluticasone-Salmeterol (ADVAIR DISKUS) 100-50 MCG/DOSE AEPB Inhale 1 puff into the lungs 2 (two) times daily. 06/28/14  Yes [provider]  menthol-cetylpyridinium (CEPACOL) 3 MG lozenge Take 1 lozenge by mouth as needed for sore throat.   Yes [provider]  mirtazapine (REMERON) 30 MG tablet Take 30 mg by mouth 2 (two) times daily.    Yes [provider]  tiotropium (SPIRIVA) 18 MCG inhalation capsule Place 1 capsule into inhaler and inhale daily. 06/28/14  Yes [provider]  VENTOLIN HFA 108 (90 Base) MCG/ACT inhaler Inhale 1-2 puffs into the lungs every 6 (six) hours as needed.  03/26/15  Yes [provider]  albuterol (PROVENTIL) (2.5 MG/3ML) 0.083% nebulizer solution Take 3 mLs (2.5 mg total) by nebulization every 4 (four) hours as needed for wheezing or shortness of breath. Patient not taking: Reported on 07/07/2016 08/23/15   Katharina Caper, MD  bisacodyl (DULCOLAX) 10 MG suppository Place 1 suppository (10 mg total) rectally as needed for moderate constipation. Patient not taking: Reported on 08/19/2015 04/17/15   Emily Filbert, MD  levofloxacin (LEVAQUIN) 750 MG tablet Take 1 tablet (750 mg total) by mouth daily. Patient not taking: Reported on 07/07/2016 08/23/15   Katharina Caper,  MD  metroNIDAZOLE (FLAGYL) 500 MG tablet Take 1 tablet (500 mg total) by mouth every 8 (eight) hours. Patient not taking: Reported on 07/07/2016 08/23/15   Katharina Caper, MD  nicotine (NICODERM CQ - DOSED IN MG/24 HOURS) 21 mg/24hr patch Place 1 patch (21 mg total) onto the skin daily. Patient not taking: Reported on 07/07/2016 08/23/15   Katharina Caper, MD  predniSONE (STERAPRED UNI-PAK 21 TAB) 10 MG (21) TBPK tablet Take 1 tablet (10 mg total) by mouth daily. Please take 6 pills in the morning with breakfast on the day 1, then taper by one pill daily until finished. Thank you. Patient not taking: Reported on 07/07/2016 08/23/15   Katharina Caper, MD    Physical Exam: Vitals:   07/07/16 1830 07/07/16 1906 07/07/16 1936 07/07/16 2143  BP:    127/73  Pulse: 86 89 87 88  Resp: 20 (!) 21 (!) 23 17  Temp:      TempSrc:      SpO2: 96% 96% 98% 98%  Weight:      Height:        GENERAL: 66 y.o.-year-old Chronically ill-appearing white female patient. HEENT: Head atraumatic, normocephalic. Pupils equal, round, reactive to light and accommodation. No scleral icterus. Extraocular muscles intact. Nares are patent. Oropharynx is clear. Mucus membranes  moist. NECK: Supple, full range of motion. No JVD, no bruit heard. No thyroid enlargement, no tenderness, no cervical lymphadenopathy. CHEST: Normal breath sounds bilaterally. No wheezing, rales, rhonchi or crackles. No use of accessory muscles of respiration.  No reproducible chest wall tenderness.  CARDIOVASCULAR: S1, S2 normal. No murmurs, rubs, or gallops. Cap refill <2 seconds. Pulses intact distally.  ABDOMEN: Soft, nondistended. Positive left lower quadrant tenderness to palpation. No rebound, guarding, rigidity. Normoactive bowel sounds present in all four quadrants. No organomegaly or mass. EXTREMITIES: No pedal edema, cyanosis, or clubbing. No calf tenderness or Homan's sign.  NEUROLOGIC: The patient is alert and oriented x 3. Cranial nerves II through  XII are grossly intact with no focal sensorimotor deficit. Muscle strength 5/5 in all extremities. Sensation intact. Gait not checked. PSYCHIATRIC:  Normal affect, mood, thought content. SKIN: Warm, dry, and intact without obvious rash, lesion, or ulcer.    Labs on Admission:  CBC:  Recent Labs Lab 07/07/16 1714  WBC 9.8  HGB 13.8  HCT 38.6  MCV 94.5  PLT 232   Basic Metabolic Panel:  Recent Labs Lab 07/07/16 1714  NA 130*  K 2.4*  CL 86*  CO2 33*  GLUCOSE 124*  BUN 17  CREATININE 0.71  CALCIUM 8.5*   GFR: Estimated Creatinine Clearance: 56.6 mL/min (by C-G formula based on SCr of 0.71 mg/dL). Liver Function Tests:  Recent Labs Lab 07/07/16 1714  AST 34  ALT 18  ALKPHOS 78  BILITOT 0.8  PROT 6.8  ALBUMIN 3.1*    Recent Labs Lab 07/07/16 1714  LIPASE 22   No results for input(s): AMMONIA in the last 168 hours. Coagulation Profile: No results for input(s): INR, PROTIME in the last 168 hours. Cardiac Enzymes: No results for input(s): CKTOTAL, CKMB, CKMBINDEX, TROPONINI in the last 168 hours. BNP (last 3 results) No results for input(s): PROBNP in the last 8760 hours. HbA1C: No results for input(s): HGBA1C in the last 72 hours. CBG: No results for input(s): GLUCAP in the last 168 hours. Lipid Profile: No results for input(s): CHOL, HDL, LDLCALC, TRIG, CHOLHDL, LDLDIRECT in the last 72 hours. Thyroid Function Tests: No results for input(s): TSH, T4TOTAL, FREET4, T3FREE, THYROIDAB in the last 72 hours. Anemia Panel: No results for input(s): VITAMINB12, FOLATE, FERRITIN, TIBC, IRON, RETICCTPCT in the last 72 hours. Urine analysis:    Component Value Date/Time   COLORURINE YELLOW (A) 07/07/2016 1911   APPEARANCEUR HAZY (A) 07/07/2016 1911   APPEARANCEUR Hazy 02/11/2013 0310   LABSPEC 1.020 07/07/2016 1911   LABSPEC 1.011 02/11/2013 0310   PHURINE 6.0 07/07/2016 1911   GLUCOSEU NEGATIVE 07/07/2016 1911   GLUCOSEU Negative 02/11/2013 0310   HGBUR  MODERATE (A) 07/07/2016 1911   BILIRUBINUR NEGATIVE 07/07/2016 1911   BILIRUBINUR Negative 02/11/2013 0310   KETONESUR 20 (A) 07/07/2016 1911   PROTEINUR 100 (A) 07/07/2016 1911   NITRITE NEGATIVE 07/07/2016 1911   LEUKOCYTESUR SMALL (A) 07/07/2016 1911   LEUKOCYTESUR Negative 02/11/2013 0310   Sepsis Labs: @LABRCNTIP (procalcitonin:4,lacticidven:4) )No results found for this or any previous visit (from the past 240 hour(s)).   Radiological Exams on Admission: Ct Abdomen Pelvis W Contrast  Result Date: 07/07/2016 CLINICAL DATA:  C/O LLQ abdomen pain, diarrhea and hematuria x 2 weeks. ^145mL ISOVUE-300 IOPAMIDOL (ISOVUE-300) INJECTION 61% EXAM: CT ABDOMEN AND PELVIS WITH CONTRAST TECHNIQUE: Multidetector CT imaging of the abdomen and pelvis was performed using the standard protocol following bolus administration of intravenous contrast. CONTRAST:  ISOVUE-300 IOPAMIDOL (ISOVUE-300) INJECTION 61%  COMPARISON:  08/20/2015 FINDINGS: Lower chest: There is minimal atelectasis at both lung bases. Heart size is normal. Hepatobiliary: No focal liver abnormality is seen. No gallstones, gallbladder wall thickening, or biliary dilatation. Pancreas: Unremarkable. No pancreatic ductal dilatation or surrounding inflammatory changes. Spleen: Normal in size without focal abnormality. Adrenals/Urinary Tract: The adrenal glands are normal. No hydronephrosis or ureteral obstruction. No renal mass. Stomach/Bowel: The stomach and small bowel loops are normal in appearance. The appendix is well seen and has a normal appearance. There is diffuse, marked thickening of the sigmoid colon, associated with scattered diverticula. Fluid tracks along the lower transverse mesocolon superior to the dome of the bladder, compressing the left lateral aspect of the bladder. There is stranding along the fat plane between the colon and the bladder and the findings are consistent consistent with micro perforation. No free intraperitoneal  air or abscess. Despite sigmoid colon thickening, there is no obstruction to the contrast material. Vascular/Lymphatic: There is atherosclerotic calcification of the abdominal aorta. No aneurysm. There is contrast opacification of the celiac axis, SMA, and IMA. Reproductive: Status post hysterectomy.  No adnexal mass. Other: No free pelvic fluid. Musculoskeletal: No acute or significant osseous findings. IMPRESSION: 1. Inflamed sigmoid colon, likely related to colonic diverticulitis. 2. Micro perforation of the mid sigmoid colon. 3. Inflammation involves the left lateral aspect of the urinary bladder and the dome of the bladder, possibly accounting for the patient's hematuria. 4. No free intraperitoneal air or abscess. 5. Status post hysterectomy. 6.  Aortic atherosclerosis. Electronically Signed   By: Norva PavlovElizabeth  Brown M.D.   On: 07/07/2016 22:00    Assessment/Plan  This is a 66 y.o. female with a history of COPD, diverticulosis, depression  now being admitted with:  #. Diverticulitis -Admit inpatient -Continue IV Zosyn -Follow-up stool studies and C. difficile panel -IV fluid hydration -Nothing by mouth -Surgical consultation appreciated  #. Hypokalemia, hyponatremia, likely secondary to decreased by mouth intake -Replace accordingly  #. UTI / colovesicular fistula -Urologic consultation has been requested  #. History of anxiety and depression - Continue Xanax, Remeron  #. History of COPD - Continue Advair, DuoNeb's, Spiriva  Admission status: Inpatient IV Fluids: NS Diet/Nutrition: NPO Consults called: Surgery  DVT Px: Lovenox, SCDs and early ambulation. Code Status: Full Code  Disposition Plan: To home in 1-2 days  All the records are reviewed and case discussed with ED provider. Management plans discussed with the patient and/or family who express understanding and agree with plan of care.  Sissi Padia D.O. on 07/07/2016 at 11:05 PM Between 7am to 6pm - Pager -  [phone] After 6pm go to [email or web address] - Social research officer, governmentpassword EPAS [facility] [place] [facility] Office [phone] CC: Primary care physician; McLean-Scocuzza, Pasty Spillersracy N, MD   07/07/2016, 11:05 PM

## 2016-07-08 DIAGNOSIS — R310 Gross hematuria: Secondary | ICD-10-CM

## 2016-07-08 LAB — GASTROINTESTINAL PANEL BY PCR, STOOL (REPLACES STOOL CULTURE)
Adenovirus F40/41: NOT DETECTED
Astrovirus: NOT DETECTED
CAMPYLOBACTER SPECIES: NOT DETECTED
CRYPTOSPORIDIUM: NOT DETECTED
Cyclospora cayetanensis: NOT DETECTED
ENTEROAGGREGATIVE E COLI (EAEC): NOT DETECTED
ENTEROPATHOGENIC E COLI (EPEC): NOT DETECTED
Entamoeba histolytica: NOT DETECTED
Enterotoxigenic E coli (ETEC): NOT DETECTED
GIARDIA LAMBLIA: NOT DETECTED
NOROVIRUS GI/GII: NOT DETECTED
PLESIMONAS SHIGELLOIDES: NOT DETECTED
ROTAVIRUS A: NOT DETECTED
SALMONELLA SPECIES: DETECTED — AB
SHIGELLA/ENTEROINVASIVE E COLI (EIEC): NOT DETECTED
Sapovirus (I, II, IV, and V): NOT DETECTED
Shiga like toxin producing E coli (STEC): NOT DETECTED
Vibrio cholerae: NOT DETECTED
Vibrio species: NOT DETECTED
YERSINIA ENTEROCOLITICA: NOT DETECTED

## 2016-07-08 LAB — BASIC METABOLIC PANEL
ANION GAP: 7 (ref 5–15)
BUN: 9 mg/dL (ref 6–20)
CHLORIDE: 96 mmol/L — AB (ref 101–111)
CO2: 32 mmol/L (ref 22–32)
CREATININE: 0.67 mg/dL (ref 0.44–1.00)
Calcium: 7.3 mg/dL — ABNORMAL LOW (ref 8.9–10.3)
GFR calc non Af Amer: >60 mL/min (ref >60)
Glucose, Bld: 85 mg/dL (ref 65–99)
Potassium: 2.4 mmol/L — CL (ref 3.5–5.1)
SODIUM: 135 mmol/L (ref 135–145)

## 2016-07-08 LAB — RAPID HIV SCREEN (HIV 1/2 AB+AG)
HIV 1/2 Antibodies: NONREACTIVE
HIV-1 P24 Antigen - HIV24: NONREACTIVE

## 2016-07-08 LAB — POTASSIUM
POTASSIUM: 2.6 mmol/L — AB (ref 3.5–5.1)
POTASSIUM: 3.5 mmol/L (ref 3.5–5.1)

## 2016-07-08 LAB — C DIFFICILE QUICK SCREEN W PCR REFLEX
C DIFFICILE (CDIFF) TOXIN: NEGATIVE
C Diff antigen: NEGATIVE
C Diff interpretation: NOT DETECTED

## 2016-07-08 LAB — MAGNESIUM: Magnesium: 2 mg/dL (ref 1.7–2.4)

## 2016-07-08 MED ORDER — MIRTAZAPINE 15 MG PO TABS
30.0000 mg | ORAL_TABLET | Freq: Two times a day (BID) | ORAL | Status: DC
  Administered 2016-07-08 – 2016-07-10 (×5): 30 mg via ORAL
  Filled 2016-07-08 (×5): qty 2

## 2016-07-08 MED ORDER — ONDANSETRON HCL 4 MG PO TABS: 4.0000 mg | ORAL_TABLET | Freq: Four times a day (QID) | ORAL | Status: DC | PRN

## 2016-07-08 MED ORDER — POTASSIUM CHLORIDE 10 MEQ/100ML IV SOLN
10.0000 meq | INTRAVENOUS | Status: AC
  Administered 2016-07-08 (×4): 10 meq via INTRAVENOUS
  Filled 2016-07-08 (×4): qty 100

## 2016-07-08 MED ORDER — SODIUM CHLORIDE 0.9 % IV SOLN
INTRAVENOUS | Status: DC
  Administered 2016-07-08: 1000 mL via INTRAVENOUS
  Administered 2016-07-08: 01:00:00 via INTRAVENOUS

## 2016-07-08 MED ORDER — ALBUTEROL SULFATE (2.5 MG/3ML) 0.083% IN NEBU: 2.5000 mg | INHALATION_SOLUTION | Freq: Four times a day (QID) | RESPIRATORY_TRACT | Status: DC | PRN

## 2016-07-08 MED ORDER — MOMETASONE FURO-FORMOTEROL FUM 100-5 MCG/ACT IN AERO
2.0000 | INHALATION_SPRAY | Freq: Two times a day (BID) | RESPIRATORY_TRACT | Status: DC
  Administered 2016-07-08 – 2016-07-10 (×5): 2 via RESPIRATORY_TRACT
  Filled 2016-07-08: qty 8.8

## 2016-07-08 MED ORDER — IPRATROPIUM BROMIDE 0.02 % IN SOLN: 0.5000 mg | Freq: Four times a day (QID) | RESPIRATORY_TRACT | Status: DC | PRN

## 2016-07-08 MED ORDER — OXYCODONE HCL 5 MG PO TABS
5.0000 mg | ORAL_TABLET | ORAL | Status: DC | PRN
  Administered 2016-07-09: 5 mg via ORAL
  Filled 2016-07-08: qty 1

## 2016-07-08 MED ORDER — ENOXAPARIN SODIUM 40 MG/0.4ML ~~LOC~~ SOLN
40.0000 mg | SUBCUTANEOUS | Status: DC
  Administered 2016-07-08 – 2016-07-10 (×3): 40 mg via SUBCUTANEOUS
  Filled 2016-07-08 (×3): qty 0.4

## 2016-07-08 MED ORDER — ACETAMINOPHEN 325 MG PO TABS: 650.0000 mg | ORAL_TABLET | Freq: Four times a day (QID) | ORAL | Status: DC | PRN

## 2016-07-08 MED ORDER — MORPHINE SULFATE (PF) 2 MG/ML IV SOLN: 1.0000 mg | INTRAVENOUS | Status: DC | PRN

## 2016-07-08 MED ORDER — PIPERACILLIN-TAZOBACTAM 3.375 G IVPB
3.3750 g | Freq: Three times a day (TID) | INTRAVENOUS | Status: DC
  Administered 2016-07-08: 3.375 g via INTRAVENOUS
  Filled 2016-07-08 (×3): qty 50

## 2016-07-08 MED ORDER — CETYLPYRIDINIUM CHLORIDE 0.05 % MT LIQD
7.0000 mL | Freq: Two times a day (BID) | OROMUCOSAL | Status: DC
  Administered 2016-07-08 – 2016-07-10 (×2): 7 mL via OROMUCOSAL

## 2016-07-08 MED ORDER — TIOTROPIUM BROMIDE MONOHYDRATE 18 MCG IN CAPS
1.0000 | ORAL_CAPSULE | Freq: Every day | RESPIRATORY_TRACT | Status: DC
  Administered 2016-07-08 – 2016-07-10 (×3): 18 ug via RESPIRATORY_TRACT
  Filled 2016-07-08: qty 5

## 2016-07-08 MED ORDER — PIPERACILLIN-TAZOBACTAM 3.375 G IVPB 30 MIN: 3.3750 g | Freq: Once | INTRAVENOUS | Status: DC

## 2016-07-08 MED ORDER — PNEUMOCOCCAL VAC POLYVALENT 25 MCG/0.5ML IJ INJ
0.5000 mL | INJECTION | INTRAMUSCULAR | Status: AC
  Administered 2016-07-10: 0.5 mL via INTRAMUSCULAR
  Filled 2016-07-08: qty 0.5

## 2016-07-08 MED ORDER — METRONIDAZOLE IN NACL 5-0.79 MG/ML-% IV SOLN
500.0000 mg | Freq: Three times a day (TID) | INTRAVENOUS | Status: DC
  Administered 2016-07-08 – 2016-07-09 (×3): 500 mg via INTRAVENOUS
  Filled 2016-07-08 (×5): qty 100

## 2016-07-08 MED ORDER — POTASSIUM CHLORIDE CRYS ER 20 MEQ PO TBCR
40.0000 meq | EXTENDED_RELEASE_TABLET | Freq: Once | ORAL | Status: AC
  Administered 2016-07-08: 40 meq via ORAL
  Filled 2016-07-08: qty 2

## 2016-07-08 MED ORDER — SENNOSIDES-DOCUSATE SODIUM 8.6-50 MG PO TABS: 1.0000 | ORAL_TABLET | Freq: Every evening | ORAL | Status: DC | PRN

## 2016-07-08 MED ORDER — ALPRAZOLAM 1 MG PO TABS
1.0000 mg | ORAL_TABLET | Freq: Four times a day (QID) | ORAL | Status: DC | PRN
  Administered 2016-07-08 – 2016-07-09 (×3): 1 mg via ORAL
  Filled 2016-07-08 (×3): qty 1

## 2016-07-08 MED ORDER — MAGNESIUM CITRATE PO SOLN
1.0000 | Freq: Once | ORAL | Status: DC | PRN
  Filled 2016-07-08: qty 296

## 2016-07-08 MED ORDER — ACETAMINOPHEN 650 MG RE SUPP: 650.0000 mg | Freq: Four times a day (QID) | RECTAL | Status: DC | PRN

## 2016-07-08 MED ORDER — ONDANSETRON HCL 4 MG/2ML IJ SOLN: 4.0000 mg | Freq: Four times a day (QID) | INTRAMUSCULAR | Status: DC | PRN

## 2016-07-08 MED ORDER — CIPROFLOXACIN HCL 500 MG PO TABS
500.0000 mg | ORAL_TABLET | Freq: Two times a day (BID) | ORAL | Status: DC
  Administered 2016-07-08 – 2016-07-09 (×3): 500 mg via ORAL
  Filled 2016-07-08 (×3): qty 1

## 2016-07-08 MED ORDER — BISACODYL 5 MG PO TBEC: 5.0000 mg | DELAYED_RELEASE_TABLET | Freq: Every day | ORAL | Status: DC | PRN

## 2016-07-08 NOTE — Consult Note (Signed)
 @ENCDATE @ 2:36 PM   Yvette Martinez 20-May-1950 [id number]  Referring provider: Dr. Otho BellowsA. Hugelmeyer  CC: Hematuria  HPI: The patient is a 66 year old female with no past GU history presented the hospital with diarrhea. Urology was consulted for gross hematuria. She had been having gross painless hematuria for the last 2 weeks. She is no history of nephrolithiasis but hasn't proven or recurrent urinary tract infection. She is never seen blood in her urine prior to this. She was eventually admitted to the hospital with inflamed sigmoid colon likely from colonic diverticulitis and a microperforation of her mid sigmoid colon. There was inflammation involving the lateral aspect of the urinary bladder near the dome however there is no intraluminal air in the bladder. At this point, the patient is feeling much better. She is voiding without difficulty. Her hematuria is resolved. Her diarrhea has resolved. She is tolerating diet.   PMH: Past Medical History:  Diagnosis Date  . COPD (chronic obstructive pulmonary disease) (HCC)   . Diverticula of colon     Surgical History: Past Surgical History:  Procedure Laterality Date  . ABDOMINAL HYSTERECTOMY      Home Medications:    Allergies:  Allergies  Allergen Reactions  . Sulfa Antibiotics Hives    Family History: No family history on file.  Social History:  reports that she has been smoking.  She has never used smokeless tobacco. Her alcohol and drug histories are not on file.  ROS: 12 point ROS negative except for above                                        Physical Exam: BP 115/60 (BP Location: Right Arm)   Pulse 79   Temp 98 F (36.7 C) (Oral)   Resp 17   Ht 5\' 2"  (1.575 m)   Wt 133 lb (60.3 kg)   SpO2 96%   BMI 24.33 kg/m   Constitutional:  Alert and oriented, No acute distress. HEENT:  AT, moist mucus membranes.  Trachea midline, no masses. Cardiovascular: No clubbing, cyanosis, or  edema. Respiratory: Normal respiratory effort, no increased work of breathing. GI: Abdomen is soft, nontender, nondistended, no abdominal masses GU: No CVA tenderness.  Skin: No rashes, bruises or suspicious lesions. Lymph: No cervical or inguinal adenopathy. Neurologic: Grossly intact, no focal deficits, moving all 4 extremities. Psychiatric: Normal mood and affect.  Laboratory Data: Lab Results  Component Value Date   WBC 9.8 07/07/2016   HGB 13.8 07/07/2016   HCT 38.6 07/07/2016   MCV 94.5 07/07/2016   PLT 232 07/07/2016    Lab Results  Component Value Date   CREATININE 0.67 07/08/2016    No results found for: PSA  No results found for: TESTOSTERONE  No results found for: HGBA1C  Urinalysis    Component Value Date/Time   COLORURINE YELLOW (A) 07/07/2016 1911   APPEARANCEUR HAZY (A) 07/07/2016 1911   APPEARANCEUR Hazy 02/11/2013 0310   LABSPEC 1.020 07/07/2016 1911   LABSPEC 1.011 02/11/2013 0310   PHURINE 6.0 07/07/2016 1911   GLUCOSEU NEGATIVE 07/07/2016 1911   GLUCOSEU Negative 02/11/2013 0310   HGBUR MODERATE (A) 07/07/2016 1911   BILIRUBINUR NEGATIVE 07/07/2016 1911   BILIRUBINUR Negative 02/11/2013 0310   KETONESUR 20 (A) 07/07/2016 1911   PROTEINUR 100 (A) 07/07/2016 1911   NITRITE NEGATIVE 07/07/2016 1911   LEUKOCYTESUR SMALL (A) 07/07/2016 1911   LEUKOCYTESUR Negative  02/11/2013 0310    Pertinent Imaging: CLINICAL DATA:  C/O LLQ abdomen pain, diarrhea and hematuria x 2 weeks. ^17mL ISOVUE-300 IOPAMIDOL (ISOVUE-300) INJECTION 61%  EXAM: CT ABDOMEN AND PELVIS WITH CONTRAST  TECHNIQUE: Multidetector CT imaging of the abdomen and pelvis was performed using the standard protocol following bolus administration of intravenous contrast.  CONTRAST:  ISOVUE-300 IOPAMIDOL (ISOVUE-300) INJECTION 61%  COMPARISON:  08/20/2015  FINDINGS: Lower chest: There is minimal atelectasis at both lung bases. Heart size is normal.  Hepatobiliary:  No focal liver abnormality is seen. No gallstones, gallbladder wall thickening, or biliary dilatation.  Pancreas: Unremarkable. No pancreatic ductal dilatation or surrounding inflammatory changes.  Spleen: Normal in size without focal abnormality.  Adrenals/Urinary Tract: The adrenal glands are normal. No hydronephrosis or ureteral obstruction. No renal mass.  Stomach/Bowel: The stomach and small bowel loops are normal in appearance. The appendix is well seen and has a normal appearance.  There is diffuse, marked thickening of the sigmoid colon, associated with scattered diverticula. Fluid tracks along the lower transverse mesocolon superior to the dome of the bladder, compressing the left lateral aspect of the bladder. There is stranding along the fat plane between the colon and the bladder and the findings are consistent consistent with micro perforation. No free intraperitoneal air or abscess. Despite sigmoid colon thickening, there is no obstruction to the contrast material.  Vascular/Lymphatic: There is atherosclerotic calcification of the abdominal aorta. No aneurysm. There is contrast opacification of the celiac axis, SMA, and IMA.  Reproductive: Status post hysterectomy.  No adnexal mass.  Other: No free pelvic fluid.  Musculoskeletal: No acute or significant osseous findings.  IMPRESSION: 1. Inflamed sigmoid colon, likely related to colonic diverticulitis. 2. Micro perforation of the mid sigmoid colon. 3. Inflammation involves the left lateral aspect of the urinary bladder and the dome of the bladder, possibly accounting for the patient's hematuria. 4. No free intraperitoneal air or abscess. 5. Status post hysterectomy. 6.  Aortic atherosclerosis.  Assessment & Plan:    1. Gross hematuria I discussed the patient that the typical workup for gross hematuria his upper tract imaging via CT scan followed by office cystoscopy. Her upper tracts on her CT scan  were unremarkable. There was inflammation from her likely diverticulitis involving the lateral aspect of her bladder. This may account for her hematuria though she still need cystoscopy. I think the colovesical fistula is unlikely due to the lack of intraluminal air within her bladder. We will give her a chance to recover from this bout of diverticulitis and have her follow-up in approximately one month for office cystoscopy. No further urological intervention indicated at this point.   Hildred Laser, MD  [facility] [address] [place], [place] [address] [phone]

## 2016-07-08 NOTE — Progress Notes (Signed)
MEDICATION RELATED CONSULT NOTE - INITIAL   Pharmacy Consult for Electrolyte management  Indication: hypokalemia  Allergies  Allergen Reactions  . Sulfa Antibiotics Hives    Patient Measurements: Height: 5\' 2"  (157.5 cm) Weight: 133 lb (60.3 kg) IBW/kg (Calculated) : 50.1 Adjusted Body Weight:   Vital Signs: Temp: 98 F (36.7 C) (05/10 1210) Temp Source: Oral (05/10 1210) BP: 115/60 (05/10 1210) Pulse Rate: 79 (05/10 1210) Intake/Output from previous day: 05/09 0701 - 05/10 0700 In: 306 [I.V.:306] Out: 300 [Urine:300] Intake/Output from this shift: Total I/O In: 0  Out: 500 [Urine:500]  Labs:  Recent Labs  07/07/16 1714 07/08/16 0739  WBC 9.8  --   HGB 13.8  --   HCT 38.6  --   PLT 232  --   CREATININE 0.71 0.67  MG  --  2.0  ALBUMIN 3.1*  --   PROT 6.8  --   AST 34  --   ALT 18  --   ALKPHOS 78  --   BILITOT 0.8  --    Estimated Creatinine Clearance: 58.4 mL/min (by C-G formula based on SCr of 0.67 mg/dL).   Microbiology: Recent Results (from the past 720 hour(s))  C difficile quick scan w PCR reflex     Status: None   Collection Time: 07/08/16 12:54 AM  Result Value Ref Range Status   C Diff antigen NEGATIVE NEGATIVE Final   C Diff toxin NEGATIVE NEGATIVE Final   C Diff interpretation No C. difficile detected.  Final  Gastrointestinal Panel by PCR , Stool     Status: Abnormal   Collection Time: 07/08/16 12:54 AM  Result Value Ref Range Status   Campylobacter species NOT DETECTED NOT DETECTED Final   Plesimonas shigelloides NOT DETECTED NOT DETECTED Final   Salmonella species DETECTED (A) NOT DETECTED Final    Comment: CRITICAL RESULT CALLED TO, READ BACK BY AND VERIFIED WITH: KAREN SMITH AT 0744 ON 07/08/16 [facility].    Yersinia enterocolitica NOT DETECTED NOT DETECTED Final   Vibrio species NOT DETECTED NOT DETECTED Final   Vibrio cholerae NOT DETECTED NOT DETECTED Final   Enteroaggregative E coli (EAEC) NOT DETECTED NOT DETECTED Final   Enteropathogenic E coli (EPEC) NOT DETECTED NOT DETECTED Final   Enterotoxigenic E coli (ETEC) NOT DETECTED NOT DETECTED Final   Shiga like toxin producing E coli (STEC) NOT DETECTED NOT DETECTED Final   Shigella/Enteroinvasive E coli (EIEC) NOT DETECTED NOT DETECTED Final   Cryptosporidium NOT DETECTED NOT DETECTED Final   Cyclospora cayetanensis NOT DETECTED NOT DETECTED Final   Entamoeba histolytica NOT DETECTED NOT DETECTED Final   Giardia lamblia NOT DETECTED NOT DETECTED Final   Adenovirus F40/41 NOT DETECTED NOT DETECTED Final   Astrovirus NOT DETECTED NOT DETECTED Final   Norovirus GI/GII NOT DETECTED NOT DETECTED Final   Rotavirus A NOT DETECTED NOT DETECTED Final   Sapovirus (I, II, IV, and V) NOT DETECTED NOT DETECTED Final    Medical History: Past Medical History:  Diagnosis Date  . COPD (chronic obstructive pulmonary disease) (HCC)   . Diverticula of colon     Medications:  Prescriptions Prior to Admission  Medication Sig Dispense Refill Last Dose  . ALPRAZolam (XANAX) 1 MG tablet Take 1 tablet by mouth every 6 (six) hours as needed for anxiety.    07/07/2016 at 1400  . antiseptic oral rinse (CPC / CETYLPYRIDINIUM CHLORIDE 0.05%) 0.05 % LIQD solution 7 mLs by Mouth Rinse route 2 (two) times daily. 118 mL 0 prn at  prn  . Fluticasone-Salmeterol (ADVAIR DISKUS) 100-50 MCG/DOSE AEPB Inhale 1 puff into the lungs 2 (two) times daily.   07/07/2016 at am  . menthol-cetylpyridinium (CEPACOL) 3 MG lozenge Take 1 lozenge by mouth as needed for sore throat.   prn at prn  . mirtazapine (REMERON) 30 MG tablet Take 30 mg by mouth 2 (two) times daily.    07/06/2016 at pm  . tiotropium (SPIRIVA) 18 MCG inhalation capsule Place 1 capsule into inhaler and inhale daily.   07/07/2016 at Unknown time  . VENTOLIN HFA 108 (90 Base) MCG/ACT inhaler Inhale 1-2 puffs into the lungs every 6 (six) hours as needed.    prn at prn  . albuterol (PROVENTIL) (2.5 MG/3ML) 0.083% nebulizer solution Take 3 mLs (2.5  mg total) by nebulization every 4 (four) hours as needed for wheezing or shortness of breath. (Patient not taking: Reported on 07/07/2016) 75 mL 12 Not Taking at Unknown time  . bisacodyl (DULCOLAX) 10 MG suppository Place 1 suppository (10 mg total) rectally as needed for moderate constipation. (Patient not taking: Reported on 08/19/2015) 12 suppository 0 Not Taking at Unknown time  . levofloxacin (LEVAQUIN) 750 MG tablet Take 1 tablet (750 mg total) by mouth daily. (Patient not taking: Reported on 07/07/2016) 5 tablet 0 Completed Course at Unknown time  . metroNIDAZOLE (FLAGYL) 500 MG tablet Take 1 tablet (500 mg total) by mouth every 8 (eight) hours. (Patient not taking: Reported on 07/07/2016) 15 tablet 0 Completed Course at Unknown time  . nicotine (NICODERM CQ - DOSED IN MG/24 HOURS) 21 mg/24hr patch Place 1 patch (21 mg total) onto the skin daily. (Patient not taking: Reported on 07/07/2016) 28 patch 0 Not Taking at Unknown time  . predniSONE (STERAPRED UNI-PAK 21 TAB) 10 MG (21) TBPK tablet Take 1 tablet (10 mg total) by mouth daily. Please take 6 pills in the morning with breakfast on the day 1, then taper by one pill daily until finished. Thank you. (Patient not taking: Reported on 07/07/2016) 21 tablet 0 Completed Course at Unknown time   Scheduled:  . antiseptic oral rinse  7 mL Mouth Rinse BID  . ciprofloxacin  500 mg Oral BID  . enoxaparin (LOVENOX) injection  40 mg Subcutaneous Q24H  . mirtazapine  30 mg Oral BID  . mometasone-formoterol  2 puff Inhalation BID  . [START ON 07/09/2016] pneumococcal 23 valent vaccine  0.5 mL Intramuscular Tomorrow-1000  . tiotropium  1 capsule Inhalation Daily    Assessment: Pharmacy consulted to manage electrolytes in this 66 year old woman   Goal of Therapy:  K= 3.5  Plan:  Will give KCl 10 mEq IV x 4 doses and will also give KCl 40 mEq PO x 1. Will recheck K @ 14:00.    ADD: K= 2.6. Will give an additional 4 doses of KCl 10 MeQ IV x 1. Will recheck K @  21:00.    Ridhima Golberg D 07/08/2016,3:05 PM

## 2016-07-08 NOTE — Progress Notes (Signed)
MEDICATION RELATED CONSULT NOTE - INITIAL   Pharmacy Consult for Electrolyte management  Indication: hypokalemia  Allergies  Allergen Reactions  . Sulfa Antibiotics Hives    Patient Measurements: Height: 5\' 2"  (157.5 cm) Weight: 133 lb (60.3 kg) IBW/kg (Calculated) : 50.1 Adjusted Body Weight:   Vital Signs: Temp: 98.4 F (36.9 C) (05/10 0633) Temp Source: Oral (05/10 16100633) BP: 108/53 (05/10 96040633) Pulse Rate: 77 (05/10 0633) Intake/Output from previous day: 05/09 0701 - 05/10 0700 In: 306 [I.V.:306] Out: 300 [Urine:300] Intake/Output from this shift: No intake/output data recorded.  Labs:  Recent Labs  07/07/16 1714 07/08/16 0739  WBC 9.8  --   HGB 13.8  --   HCT 38.6  --   PLT 232  --   CREATININE 0.71 0.67  MG  --  2.0  ALBUMIN 3.1*  --   PROT 6.8  --   AST 34  --   ALT 18  --   ALKPHOS 78  --   BILITOT 0.8  --    Estimated Creatinine Clearance: 58.4 mL/min (by C-G formula based on SCr of 0.67 mg/dL).   Microbiology: Recent Results (from the past 720 hour(s))  C difficile quick scan w PCR reflex     Status: None   Collection Time: 07/08/16 12:54 AM  Result Value Ref Range Status   C Diff antigen NEGATIVE NEGATIVE Final   C Diff toxin NEGATIVE NEGATIVE Final   C Diff interpretation No C. difficile detected.  Final  Gastrointestinal Panel by PCR , Stool     Status: Abnormal   Collection Time: 07/08/16 12:54 AM  Result Value Ref Range Status   Campylobacter species NOT DETECTED NOT DETECTED Final   Plesimonas shigelloides NOT DETECTED NOT DETECTED Final   Salmonella species DETECTED (A) NOT DETECTED Final    Comment: CRITICAL RESULT CALLED TO, READ BACK BY AND VERIFIED WITH: KAREN SMITH AT 0744 ON 07/08/16 [facility].    Yersinia enterocolitica NOT DETECTED NOT DETECTED Final   Vibrio species NOT DETECTED NOT DETECTED Final   Vibrio cholerae NOT DETECTED NOT DETECTED Final   Enteroaggregative E coli (EAEC) NOT DETECTED NOT DETECTED Final    Enteropathogenic E coli (EPEC) NOT DETECTED NOT DETECTED Final   Enterotoxigenic E coli (ETEC) NOT DETECTED NOT DETECTED Final   Shiga like toxin producing E coli (STEC) NOT DETECTED NOT DETECTED Final   Shigella/Enteroinvasive E coli (EIEC) NOT DETECTED NOT DETECTED Final   Cryptosporidium NOT DETECTED NOT DETECTED Final   Cyclospora cayetanensis NOT DETECTED NOT DETECTED Final   Entamoeba histolytica NOT DETECTED NOT DETECTED Final   Giardia lamblia NOT DETECTED NOT DETECTED Final   Adenovirus F40/41 NOT DETECTED NOT DETECTED Final   Astrovirus NOT DETECTED NOT DETECTED Final   Norovirus GI/GII NOT DETECTED NOT DETECTED Final   Rotavirus A NOT DETECTED NOT DETECTED Final   Sapovirus (I, II, IV, and V) NOT DETECTED NOT DETECTED Final    Medical History: Past Medical History:  Diagnosis Date  . COPD (chronic obstructive pulmonary disease) (HCC)   . Diverticula of colon     Medications:  Prescriptions Prior to Admission  Medication Sig Dispense Refill Last Dose  . ALPRAZolam (XANAX) 1 MG tablet Take 1 tablet by mouth every 6 (six) hours as needed for anxiety.    07/07/2016 at 1400  . antiseptic oral rinse (CPC / CETYLPYRIDINIUM CHLORIDE 0.05%) 0.05 % LIQD solution 7 mLs by Mouth Rinse route 2 (two) times daily. 118 mL 0 prn at prn  .  Fluticasone-Salmeterol (ADVAIR DISKUS) 100-50 MCG/DOSE AEPB Inhale 1 puff into the lungs 2 (two) times daily.   07/07/2016 at am  . menthol-cetylpyridinium (CEPACOL) 3 MG lozenge Take 1 lozenge by mouth as needed for sore throat.   prn at prn  . mirtazapine (REMERON) 30 MG tablet Take 30 mg by mouth 2 (two) times daily.    07/06/2016 at pm  . tiotropium (SPIRIVA) 18 MCG inhalation capsule Place 1 capsule into inhaler and inhale daily.   07/07/2016 at Unknown time  . VENTOLIN HFA 108 (90 Base) MCG/ACT inhaler Inhale 1-2 puffs into the lungs every 6 (six) hours as needed.    prn at prn  . albuterol (PROVENTIL) (2.5 MG/3ML) 0.083% nebulizer solution Take 3 mLs (2.5  mg total) by nebulization every 4 (four) hours as needed for wheezing or shortness of breath. (Patient not taking: Reported on 07/07/2016) 75 mL 12 Not Taking at Unknown time  . bisacodyl (DULCOLAX) 10 MG suppository Place 1 suppository (10 mg total) rectally as needed for moderate constipation. (Patient not taking: Reported on 08/19/2015) 12 suppository 0 Not Taking at Unknown time  . levofloxacin (LEVAQUIN) 750 MG tablet Take 1 tablet (750 mg total) by mouth daily. (Patient not taking: Reported on 07/07/2016) 5 tablet 0 Completed Course at Unknown time  . metroNIDAZOLE (FLAGYL) 500 MG tablet Take 1 tablet (500 mg total) by mouth every 8 (eight) hours. (Patient not taking: Reported on 07/07/2016) 15 tablet 0 Completed Course at Unknown time  . nicotine (NICODERM CQ - DOSED IN MG/24 HOURS) 21 mg/24hr patch Place 1 patch (21 mg total) onto the skin daily. (Patient not taking: Reported on 07/07/2016) 28 patch 0 Not Taking at Unknown time  . predniSONE (STERAPRED UNI-PAK 21 TAB) 10 MG (21) TBPK tablet Take 1 tablet (10 mg total) by mouth daily. Please take 6 pills in the morning with breakfast on the day 1, then taper by one pill daily until finished. Thank you. (Patient not taking: Reported on 07/07/2016) 21 tablet 0 Completed Course at Unknown time   Scheduled:  . antiseptic oral rinse  7 mL Mouth Rinse BID  . enoxaparin (LOVENOX) injection  40 mg Subcutaneous Q24H  . mirtazapine  30 mg Oral BID  . mometasone-formoterol  2 puff Inhalation BID  . [START ON 07/09/2016] pneumococcal 23 valent vaccine  0.5 mL Intramuscular Tomorrow-1000  . potassium chloride  40 mEq Oral Once  . tiotropium  1 capsule Inhalation Daily    Assessment: Pharmacy consulted to manage electrolytes in this 66 year old woman   Goal of Therapy:  K= 3.5  Plan:  Will give KCl 10 mEq IV x 4 doses and will also give KCl 40 mEq PO x 1. Will recheck K @ 14:00.    Raeshawn Tafolla D 07/08/2016,9:26 AM

## 2016-07-08 NOTE — Progress Notes (Signed)
MEDICATION RELATED CONSULT NOTE - INITIAL   Pharmacy Consult for Electrolyte management  Indication: hypokalemia  Allergies  Allergen Reactions  . Sulfa Antibiotics Hives    Patient Measurements: Height: 5\' 2"  (157.5 cm) Weight: 133 lb (60.3 kg) IBW/kg (Calculated) : 50.1 Adjusted Body Weight:   Vital Signs: Temp: 99.2 F (37.3 C) (05/10 2033) Temp Source: Oral (05/10 2033) BP: 96/50 (05/10 2033) Pulse Rate: 98 (05/10 2033) Intake/Output from previous day: 05/09 0701 - 05/10 0700 In: 306 [I.V.:306] Out: 300 [Urine:300] Intake/Output from this shift: No intake/output data recorded.  Labs:  Recent Labs  07/07/16 1714 07/08/16 0739  WBC 9.8  --   HGB 13.8  --   HCT 38.6  --   PLT 232  --   CREATININE 0.71 0.67  MG  --  2.0  ALBUMIN 3.1*  --   PROT 6.8  --   AST 34  --   ALT 18  --   ALKPHOS 78  --   BILITOT 0.8  --    Estimated Creatinine Clearance: 58.4 mL/min (by C-G formula based on SCr of 0.67 mg/dL).   Microbiology: Recent Results (from the past 720 hour(s))  C difficile quick scan w PCR reflex     Status: None   Collection Time: 07/08/16 12:54 AM  Result Value Ref Range Status   C Diff antigen NEGATIVE NEGATIVE Final   C Diff toxin NEGATIVE NEGATIVE Final   C Diff interpretation No C. difficile detected.  Final  Gastrointestinal Panel by PCR , Stool     Status: Abnormal   Collection Time: 07/08/16 12:54 AM  Result Value Ref Range Status   Campylobacter species NOT DETECTED NOT DETECTED Final   Plesimonas shigelloides NOT DETECTED NOT DETECTED Final   Salmonella species DETECTED (A) NOT DETECTED Final    Comment: CRITICAL RESULT CALLED TO, READ BACK BY AND VERIFIED WITH: KAREN SMITH AT 0744 ON 07/08/16 [facility].    Yersinia enterocolitica NOT DETECTED NOT DETECTED Final   Vibrio species NOT DETECTED NOT DETECTED Final   Vibrio cholerae NOT DETECTED NOT DETECTED Final   Enteroaggregative E coli (EAEC) NOT DETECTED NOT DETECTED Final   Enteropathogenic E coli (EPEC) NOT DETECTED NOT DETECTED Final   Enterotoxigenic E coli (ETEC) NOT DETECTED NOT DETECTED Final   Shiga like toxin producing E coli (STEC) NOT DETECTED NOT DETECTED Final   Shigella/Enteroinvasive E coli (EIEC) NOT DETECTED NOT DETECTED Final   Cryptosporidium NOT DETECTED NOT DETECTED Final   Cyclospora cayetanensis NOT DETECTED NOT DETECTED Final   Entamoeba histolytica NOT DETECTED NOT DETECTED Final   Giardia lamblia NOT DETECTED NOT DETECTED Final   Adenovirus F40/41 NOT DETECTED NOT DETECTED Final   Astrovirus NOT DETECTED NOT DETECTED Final   Norovirus GI/GII NOT DETECTED NOT DETECTED Final   Rotavirus A NOT DETECTED NOT DETECTED Final   Sapovirus (I, II, IV, and V) NOT DETECTED NOT DETECTED Final    Medical History: Past Medical History:  Diagnosis Date  . COPD (chronic obstructive pulmonary disease) (HCC)   . Diverticula of colon     Medications:  Prescriptions Prior to Admission  Medication Sig Dispense Refill Last Dose  . ALPRAZolam (XANAX) 1 MG tablet Take 1 tablet by mouth every 6 (six) hours as needed for anxiety.    07/07/2016 at 1400  . antiseptic oral rinse (CPC / CETYLPYRIDINIUM CHLORIDE 0.05%) 0.05 % LIQD solution 7 mLs by Mouth Rinse route 2 (two) times daily. 118 mL 0 prn at prn  . Fluticasone-Salmeterol (  ADVAIR DISKUS) 100-50 MCG/DOSE AEPB Inhale 1 puff into the lungs 2 (two) times daily.   07/07/2016 at am  . menthol-cetylpyridinium (CEPACOL) 3 MG lozenge Take 1 lozenge by mouth as needed for sore throat.   prn at prn  . mirtazapine (REMERON) 30 MG tablet Take 30 mg by mouth 2 (two) times daily.    07/06/2016 at pm  . tiotropium (SPIRIVA) 18 MCG inhalation capsule Place 1 capsule into inhaler and inhale daily.   07/07/2016 at Unknown time  . VENTOLIN HFA 108 (90 Base) MCG/ACT inhaler Inhale 1-2 puffs into the lungs every 6 (six) hours as needed.    prn at prn  . albuterol (PROVENTIL) (2.5 MG/3ML) 0.083% nebulizer solution Take 3 mLs (2.5  mg total) by nebulization every 4 (four) hours as needed for wheezing or shortness of breath. (Patient not taking: Reported on 07/07/2016) 75 mL 12 Not Taking at Unknown time  . bisacodyl (DULCOLAX) 10 MG suppository Place 1 suppository (10 mg total) rectally as needed for moderate constipation. (Patient not taking: Reported on 08/19/2015) 12 suppository 0 Not Taking at Unknown time  . levofloxacin (LEVAQUIN) 750 MG tablet Take 1 tablet (750 mg total) by mouth daily. (Patient not taking: Reported on 07/07/2016) 5 tablet 0 Completed Course at Unknown time  . metroNIDAZOLE (FLAGYL) 500 MG tablet Take 1 tablet (500 mg total) by mouth every 8 (eight) hours. (Patient not taking: Reported on 07/07/2016) 15 tablet 0 Completed Course at Unknown time  . nicotine (NICODERM CQ - DOSED IN MG/24 HOURS) 21 mg/24hr patch Place 1 patch (21 mg total) onto the skin daily. (Patient not taking: Reported on 07/07/2016) 28 patch 0 Not Taking at Unknown time  . predniSONE (STERAPRED UNI-PAK 21 TAB) 10 MG (21) TBPK tablet Take 1 tablet (10 mg total) by mouth daily. Please take 6 pills in the morning with breakfast on the day 1, then taper by one pill daily until finished. Thank you. (Patient not taking: Reported on 07/07/2016) 21 tablet 0 Completed Course at Unknown time   Scheduled:  . antiseptic oral rinse  7 mL Mouth Rinse BID  . ciprofloxacin  500 mg Oral BID  . enoxaparin (LOVENOX) injection  40 mg Subcutaneous Q24H  . mirtazapine  30 mg Oral BID  . mometasone-formoterol  2 puff Inhalation BID  . [START ON 07/09/2016] pneumococcal 23 valent vaccine  0.5 mL Intramuscular Tomorrow-1000  . tiotropium  1 capsule Inhalation Daily    Assessment: Pharmacy consulted to manage electrolytes in this 66 year old woman   Goal of Therapy:  K= 3.5  Plan:  Will give KCl 10 mEq IV x 4 doses and will also give KCl 40 mEq PO x 1. Will recheck K @ 14:00.    ADD: K= 2.6. Will give an additional 4 doses of KCl 10 MeQ IV x 1. Will recheck K @  21:00.   5/10:  K @ 21:00 = 3.5.   Will recheck electrolytes on 5/11 with AM labs.    Li Bobo D 07/08/2016,9:40 PM

## 2016-07-08 NOTE — Progress Notes (Signed)
CC: Diverticulitis Subjective: Patient admitted overnight with diverticulitis with microperforation. She reports that she has no pain today and has not had any pain. She has been having diarrhea which is why she came to the hospital. Diarrhea appears to have resolved. She states she wants to eat or she will leave.  Objective: Vital signs in last 24 hours: Temp:  [98.3 F (36.8 C)-99.1 F (37.3 C)] 98.4 F (36.9 C) (05/10 1610) Pulse Rate:  [77-97] 77 (05/10 0633) Resp:  [16-23] 18 (05/10 0633) BP: (108-127)/(48-74) 108/53 (05/10 0633) SpO2:  [88 %-98 %] 97 % (05/10 9604) Weight:  [60.3 kg (133 lb)-63 kg (139 lb)] 60.3 kg (133 lb) (05/10 0059) Last BM Date: 07/08/16  Intake/Output from previous day: 05/09 0701 - 05/10 0700 In: 306 [I.V.:306] Out: 300 [Urine:300] Intake/Output this shift: No intake/output data recorded.  Physical exam:  Gen.: No acute distress Chest: Clear to auscultation Heart: Regular rhythm Abdomen: Soft, nontender, nondistended  Lab Results: CBC   Recent Labs  07/07/16 1714  WBC 9.8  HGB 13.8  HCT 38.6  PLT 232   BMET  Recent Labs  07/07/16 1714 07/08/16 0739  NA 130* 135  K 2.4* 2.4*  CL 86* 96*  CO2 33* 32  GLUCOSE 124* 85  BUN 17 9  CREATININE 0.71 0.67  CALCIUM 8.5* 7.3*   PT/INR No results for input(s): LABPROT, INR in the last 72 hours. ABG No results for input(s): PHART, HCO3 in the last 72 hours.  Invalid input(s): PCO2, PO2  Studies/Results: Ct Abdomen Pelvis W Contrast  Result Date: 07/07/2016 CLINICAL DATA:  C/O LLQ abdomen pain, diarrhea and hematuria x 2 weeks. ^11mL ISOVUE-300 IOPAMIDOL (ISOVUE-300) INJECTION 61% EXAM: CT ABDOMEN AND PELVIS WITH CONTRAST TECHNIQUE: Multidetector CT imaging of the abdomen and pelvis was performed using the standard protocol following bolus administration of intravenous contrast. CONTRAST:  ISOVUE-300 IOPAMIDOL (ISOVUE-300) INJECTION 61% COMPARISON:  08/20/2015 FINDINGS: Lower  chest: There is minimal atelectasis at both lung bases. Heart size is normal. Hepatobiliary: No focal liver abnormality is seen. No gallstones, gallbladder wall thickening, or biliary dilatation. Pancreas: Unremarkable. No pancreatic ductal dilatation or surrounding inflammatory changes. Spleen: Normal in size without focal abnormality. Adrenals/Urinary Tract: The adrenal glands are normal. No hydronephrosis or ureteral obstruction. No renal mass. Stomach/Bowel: The stomach and small bowel loops are normal in appearance. The appendix is well seen and has a normal appearance. There is diffuse, marked thickening of the sigmoid colon, associated with scattered diverticula. Fluid tracks along the lower transverse mesocolon superior to the dome of the bladder, compressing the left lateral aspect of the bladder. There is stranding along the fat plane between the colon and the bladder and the findings are consistent consistent with micro perforation. No free intraperitoneal air or abscess. Despite sigmoid colon thickening, there is no obstruction to the contrast material. Vascular/Lymphatic: There is atherosclerotic calcification of the abdominal aorta. No aneurysm. There is contrast opacification of the celiac axis, SMA, and IMA. Reproductive: Status post hysterectomy.  No adnexal mass. Other: No free pelvic fluid. Musculoskeletal: No acute or significant osseous findings. IMPRESSION: 1. Inflamed sigmoid colon, likely related to colonic diverticulitis. 2. Micro perforation of the mid sigmoid colon. 3. Inflammation involves the left lateral aspect of the urinary bladder and the dome of the bladder, possibly accounting for the patient's hematuria. 4. No free intraperitoneal air or abscess. 5. Status post hysterectomy. 6.  Aortic atherosclerosis. Electronically Signed   By: Norva Pavlov M.D.   On: 07/07/2016 22:00  Anti-infectives: Anti-infectives    Start     Dose/Rate Route Frequency Ordered Stop   07/08/16  1100  metroNIDAZOLE (FLAGYL) IVPB 500 mg     500 mg 100 mL/hr over 60 Minutes Intravenous Every 8 hours 07/08/16 1008     07/08/16 1045  ciprofloxacin (CIPRO) tablet 500 mg     500 mg Oral 2 times daily 07/08/16 1040     07/08/16 0700  piperacillin-tazobactam (ZOSYN) IVPB 3.375 g  Status:  Discontinued     3.375 g 12.5 mL/hr over 240 Minutes Intravenous Every 8 hours 07/08/16 0642 07/08/16 1008   07/08/16 0115  piperacillin-tazobactam (ZOSYN) IVPB 3.375 g  Status:  Discontinued     3.375 g 100 mL/hr over 30 Minutes Intravenous  Once 07/08/16 0106 07/08/16 0111   07/07/16 2300  piperacillin-tazobactam (ZOSYN) IVPB 3.375 g     3.375 g 100 mL/hr over 30 Minutes Intravenous  Once 07/07/16 2250 07/08/16 0011      Assessment/Plan:  66 year old female with multiple medical problems and sigmoid diverticulitis with microperforation. Completely asymptomatic. Okay to start a diet. She will need a course of oral antibiotics. No indications for urgent surgical intervention at this time. Surgery will continue to follow with you.  Tunis Gentle T. Tonita CongWoodham, MD, [facility] General Surgeon [facility]  Day [facility] [phone](7a-7p) [phone] Night [facility] [phone](7p-7a) [phone] 07/08/2016

## 2016-07-08 NOTE — Progress Notes (Signed)
PT Cancellation Note  Patient Details Name: Yvette Martinez MRN: [id number] DOB: 1950/07/24   Cancelled Treatment:    Reason Eval/Treat Not Completed: Medical issues which prohibited therapy.  Pt's potassium noted to be 2.4 this morning; will hold PT at this time d/t low potassium (per PT protocol for low potassium).  Will re-attempt PT eval at a later date/time as medically appropriate.  Hendricks LimesEmily Danne Vasek, PT 07/08/16, 9:31 AM [phone]

## 2016-07-08 NOTE — Care Management (Addendum)
Spoke with patient regarding discharge planning. She states she is no longer requiring O2 which is acute. She is from home where she is independent. Her daughter and daughter's children live there too. She denies need for walker or any other ambulatory aide. [place] [facility] [place]. Dr. Vinson MoselleLuyando can [facility] 200P [phone]. Patient advised of appointment made and that she will need to make appointment with Urologist when she can review her calendar. She agrees to appointment date and time with Dr. Vinson MoselleLuyando.

## 2016-07-08 NOTE — Progress Notes (Signed)
[facility] [place] [facility]   PATIENT NAME: Yvette Martinez    MR#:  [id number]  DATE OF BIRTH:  [birth year]  SUBJECTIVE:   I want to eat or I will leave AMA. I have no abdominal pain  REVIEW OF SYSTEMS:    Review of Systems  Constitutional: Negative for fever, chills weight loss HENT: Negative for ear pain, nosebleeds, congestion, facial swelling, rhinorrhea, neck pain, neck stiffness and ear discharge.   Respiratory: Negative for cough, shortness of breath, wheezing  Cardiovascular: Negative for chest pain, palpitations and leg swelling.  Gastrointestinal: Negative for heartburn, abdominal pain, vomiting, diarrhea Has improved or consitpation Genitourinary: Negative for dysuria, urgency, frequency, hematuria Musculoskeletal: Negative for back pain or joint pain Neurological: Negative for dizziness, seizures, syncope, focal weakness,  numbness and headaches.  Hematological: Does not bruise/bleed easily.  Psychiatric/Behavioral: Negative for hallucinations, confusion, dysphoric mood    Tolerating Diet: npo      DRUG ALLERGIES:   Allergies  Allergen Reactions  . Sulfa Antibiotics Hives    VITALS:  Blood pressure (!) 108/53, pulse 77, temperature 98.4 F (36.9 C), temperature source Oral, resp. rate 18, height 5\' 2"  (1.575 m), weight 60.3 kg (133 lb), SpO2 97 %.  PHYSICAL EXAMINATION:  Constitutional: Appears well-developed and well-nourished. No distress. HENT: Normocephalic. Marland Kitchen. Oropharynx is clear and moist.  Eyes: Conjunctivae and EOM are normal. PERRLA, no scleral icterus.  Neck: Normal ROM. Neck supple. No JVD. No tracheal deviation. CVS: RRR, S1/S2 +, no murmurs, no gallops, no carotid bruit.  Pulmonary: Effort and breath sounds normal, no stridor, rhonchi, wheezes, rales.  Abdominal: Soft. BS +,  no distension, Mild left lower quadrant tenderness without rebound or guarding.  Musculoskeletal: Normal range of motion. No edema and no  tenderness.  Neuro: Alert. CN 2-12 grossly intact. No focal deficits. Skin: Skin is warm and dry. No rash noted. Psychiatric: Normal mood and affect.      LABORATORY PANEL:   CBC  Recent Labs Lab 07/07/16 1714  WBC 9.8  HGB 13.8  HCT 38.6  PLT 232   ------------------------------------------------------------------------------------------------------------------  Chemistries   Recent Labs Lab 07/07/16 1714 07/08/16 0739  NA 130* 135  K 2.4* 2.4*  CL 86* 96*  CO2 33* 32  GLUCOSE 124* 85  BUN 17 9  CREATININE 0.71 0.67  CALCIUM 8.5* 7.3*  MG  --  2.0  AST 34  --   ALT 18  --   ALKPHOS 78  --   BILITOT 0.8  --    ------------------------------------------------------------------------------------------------------------------  Cardiac Enzymes No results for input(s): TROPONINI in the last 168 hours. ------------------------------------------------------------------------------------------------------------------  RADIOLOGY:  Ct Abdomen Pelvis W Contrast  Result Date: 07/07/2016 CLINICAL DATA:  C/O LLQ abdomen pain, diarrhea and hematuria x 2 weeks. ^16100mL ISOVUE-300 IOPAMIDOL (ISOVUE-300) INJECTION 61% EXAM: CT ABDOMEN AND PELVIS WITH CONTRAST TECHNIQUE: Multidetector CT imaging of the abdomen and pelvis was performed using the standard protocol following bolus administration of intravenous contrast. CONTRAST:  100mL ISOVUE-300 IOPAMIDOL (ISOVUE-300) INJECTION 61% COMPARISON:  08/20/2015 FINDINGS: Lower chest: There is minimal atelectasis at both lung bases. Heart size is normal. Hepatobiliary: No focal liver abnormality is seen. No gallstones, gallbladder wall thickening, or biliary dilatation. Pancreas: Unremarkable. No pancreatic ductal dilatation or surrounding inflammatory changes. Spleen: Normal in size without focal abnormality. Adrenals/Urinary Tract: The adrenal glands are normal. No hydronephrosis or ureteral obstruction. No renal mass. Stomach/Bowel: The  stomach and small bowel loops are normal in appearance. The appendix is well seen and  has a normal appearance. There is diffuse, marked thickening of the sigmoid colon, associated with scattered diverticula. Fluid tracks along the lower transverse mesocolon superior to the dome of the bladder, compressing the left lateral aspect of the bladder. There is stranding along the fat plane between the colon and the bladder and the findings are consistent consistent with micro perforation. No free intraperitoneal air or abscess. Despite sigmoid colon thickening, there is no obstruction to the contrast material. Vascular/Lymphatic: There is atherosclerotic calcification of the abdominal aorta. No aneurysm. There is contrast opacification of the celiac axis, SMA, and IMA. Reproductive: Status post hysterectomy.  No adnexal mass. Other: No free pelvic fluid. Musculoskeletal: No acute or significant osseous findings. IMPRESSION: 1. Inflamed sigmoid colon, likely related to colonic diverticulitis. 2. Micro perforation of the mid sigmoid colon. 3. Inflammation involves the left lateral aspect of the urinary bladder and the dome of the bladder, possibly accounting for the patient's hematuria. 4. No free intraperitoneal air or abscess. 5. Status post hysterectomy. 6.  Aortic atherosclerosis. Electronically Signed   By: Norva Pavlov M.D.   On: 07/07/2016 22:00     ASSESSMENT AND PLAN:   66 year old female with a history of diverticulitis and COPD who presents with diarrhea and intermittent abdominal pain.  1. Acute diverticulitis with perforation: Appreciate surgical evaluation As per surgery okay to try soft diet  2. Salmonella GI poisoning with diarrhea: Switch antibiotics to Cipro and Flagyl  3. Hypokalemia: Will replete aggressively  Repeat in a.m. Magnesium level 2.0  4.hypocalcemia: Check ionized  ALB 3.1  5. Tobacco dependence: Patient is encouraged to quit smoking. Counseling was provided for 4  minutes.   6. History of COPD without exacerbation: Continue inhalers and nebulizer treatments  7. History of anxiety and depression: Continue Remeron  8. UTI with hematuria: Neurology consult recommended by surgical services to evaluate for colovesicular fistula.    Management plans discussed with the patient and she is in agreement.  CODE STATUS: full  TOTAL TIME TAKING CARE OF THIS PATIENT: 30 minutes.   D/w dr Tonita Cong  POSSIBLE D/C 1-2 days, DEPENDING ON CLINICAL CONDITION.   Tiahna Cure M.D on 07/08/2016 at 10:08 AM  Between 7am to 6pm - Pager - [phone] After 6pm go to [email or web address] - password [email or web address]  [facility]  Office  [phone]  CC: Primary care physician; McLean-Scocuzza, Pasty Spillers, MD  Note: This dictation was prepared with Dragon dictation along with smaller phrase technology. Any transcriptional errors that result from this process are unintentional.

## 2016-07-08 NOTE — Progress Notes (Signed)
ANTIBIOTIC CONSULT NOTE - INITIAL  Pharmacy Consult for Ciprofloxacin Indication: Salmonella GI poisoning   Allergies  Allergen Reactions  . Sulfa Antibiotics Hives    Patient Measurements: Height: 5\' 2"  (157.5 cm) Weight: 133 lb (60.3 kg) IBW/kg (Calculated) : 50.1 Adjusted Body Weight:   Vital Signs: Temp: 98.4 F (36.9 C) (05/10 0633) Temp Source: Oral (05/10 2956) BP: 108/53 (05/10 2130) Pulse Rate: 77 (05/10 0633) Intake/Output from previous day: 05/09 0701 - 05/10 0700 In: 306 [I.V.:306] Out: 300 [Urine:300] Intake/Output from this shift: No intake/output data recorded.  Labs:  Recent Labs  07/07/16 1714 07/08/16 0739  WBC 9.8  --   HGB 13.8  --   PLT 232  --   CREATININE 0.71 0.67   Estimated Creatinine Clearance: 58.4 mL/min (by C-G formula based on SCr of 0.67 mg/dL). No results for input(s): VANCOTROUGH, VANCOPEAK, VANCORANDOM, GENTTROUGH, GENTPEAK, GENTRANDOM, TOBRATROUGH, TOBRAPEAK, TOBRARND, AMIKACINPEAK, AMIKACINTROU, AMIKACIN in the last 72 hours.   Microbiology: Recent Results (from the past 720 hour(s))  C difficile quick scan w PCR reflex     Status: None   Collection Time: 07/08/16 12:54 AM  Result Value Ref Range Status   C Diff antigen NEGATIVE NEGATIVE Final   C Diff toxin NEGATIVE NEGATIVE Final   C Diff interpretation No C. difficile detected.  Final  Gastrointestinal Panel by PCR , Stool     Status: Abnormal   Collection Time: 07/08/16 12:54 AM  Result Value Ref Range Status   Campylobacter species NOT DETECTED NOT DETECTED Final   Plesimonas shigelloides NOT DETECTED NOT DETECTED Final   Salmonella species DETECTED (A) NOT DETECTED Final    Comment: CRITICAL RESULT CALLED TO, READ BACK BY AND VERIFIED WITH: KAREN SMITH AT 0744 ON 07/08/16 [facility].    Yersinia enterocolitica NOT DETECTED NOT DETECTED Final   Vibrio species NOT DETECTED NOT DETECTED Final   Vibrio cholerae NOT DETECTED NOT DETECTED Final   Enteroaggregative E coli  (EAEC) NOT DETECTED NOT DETECTED Final   Enteropathogenic E coli (EPEC) NOT DETECTED NOT DETECTED Final   Enterotoxigenic E coli (ETEC) NOT DETECTED NOT DETECTED Final   Shiga like toxin producing E coli (STEC) NOT DETECTED NOT DETECTED Final   Shigella/Enteroinvasive E coli (EIEC) NOT DETECTED NOT DETECTED Final   Cryptosporidium NOT DETECTED NOT DETECTED Final   Cyclospora cayetanensis NOT DETECTED NOT DETECTED Final   Entamoeba histolytica NOT DETECTED NOT DETECTED Final   Giardia lamblia NOT DETECTED NOT DETECTED Final   Adenovirus F40/41 NOT DETECTED NOT DETECTED Final   Astrovirus NOT DETECTED NOT DETECTED Final   Norovirus GI/GII NOT DETECTED NOT DETECTED Final   Rotavirus A NOT DETECTED NOT DETECTED Final   Sapovirus (I, II, IV, and V) NOT DETECTED NOT DETECTED Final    Medical History: Past Medical History:  Diagnosis Date  . COPD (chronic obstructive pulmonary disease) (HCC)   . Diverticula of colon     Medications:  Prescriptions Prior to Admission  Medication Sig Dispense Refill Last Dose  . ALPRAZolam (XANAX) 1 MG tablet Take 1 tablet by mouth every 6 (six) hours as needed for anxiety.    07/07/2016 at 1400  . antiseptic oral rinse (CPC / CETYLPYRIDINIUM CHLORIDE 0.05%) 0.05 % LIQD solution 7 mLs by Mouth Rinse route 2 (two) times daily. 118 mL 0 prn at prn  . Fluticasone-Salmeterol (ADVAIR DISKUS) 100-50 MCG/DOSE AEPB Inhale 1 puff into the lungs 2 (two) times daily.   07/07/2016 at am  . menthol-cetylpyridinium (CEPACOL) 3 MG lozenge  Take 1 lozenge by mouth as needed for sore throat.   prn at prn  . mirtazapine (REMERON) 30 MG tablet Take 30 mg by mouth 2 (two) times daily.    07/06/2016 at pm  . tiotropium (SPIRIVA) 18 MCG inhalation capsule Place 1 capsule into inhaler and inhale daily.   07/07/2016 at Unknown time  . VENTOLIN HFA 108 (90 Base) MCG/ACT inhaler Inhale 1-2 puffs into the lungs every 6 (six) hours as needed.    prn at prn  . albuterol (PROVENTIL) (2.5 MG/3ML)  0.083% nebulizer solution Take 3 mLs (2.5 mg total) by nebulization every 4 (four) hours as needed for wheezing or shortness of breath. (Patient not taking: Reported on 07/07/2016) 75 mL 12 Not Taking at Unknown time  . bisacodyl (DULCOLAX) 10 MG suppository Place 1 suppository (10 mg total) rectally as needed for moderate constipation. (Patient not taking: Reported on 08/19/2015) 12 suppository 0 Not Taking at Unknown time  . levofloxacin (LEVAQUIN) 750 MG tablet Take 1 tablet (750 mg total) by mouth daily. (Patient not taking: Reported on 07/07/2016) 5 tablet 0 Completed Course at Unknown time  . metroNIDAZOLE (FLAGYL) 500 MG tablet Take 1 tablet (500 mg total) by mouth every 8 (eight) hours. (Patient not taking: Reported on 07/07/2016) 15 tablet 0 Completed Course at Unknown time  . nicotine (NICODERM CQ - DOSED IN MG/24 HOURS) 21 mg/24hr patch Place 1 patch (21 mg total) onto the skin daily. (Patient not taking: Reported on 07/07/2016) 28 patch 0 Not Taking at Unknown time  . predniSONE (STERAPRED UNI-PAK 21 TAB) 10 MG (21) TBPK tablet Take 1 tablet (10 mg total) by mouth daily. Please take 6 pills in the morning with breakfast on the day 1, then taper by one pill daily until finished. Thank you. (Patient not taking: Reported on 07/07/2016) 21 tablet 0 Completed Course at Unknown time   Scheduled:  . antiseptic oral rinse  7 mL Mouth Rinse BID  . ciprofloxacin  500 mg Oral BID  . enoxaparin (LOVENOX) injection  40 mg Subcutaneous Q24H  . mirtazapine  30 mg Oral BID  . mometasone-formoterol  2 puff Inhalation BID  . [START ON 07/09/2016] pneumococcal 23 valent vaccine  0.5 mL Intramuscular Tomorrow-1000  . potassium chloride  40 mEq Oral Once  . tiotropium  1 capsule Inhalation Daily   Assessment: Pharmacy consulted to dose and monitor Cipro for Salmonella GI poisoning. Patient is also currently on Metronidazole  Goal of Therapy:    Plan:  Will start Cipro 500 mg PO BID.   Nicosha Struve  D 07/08/2016,10:41 AM

## 2016-07-08 NOTE — Progress Notes (Signed)
Dr. Juliene PinaMody made aware of GI panel of Salmonella detected
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
## 2016-07-09 ENCOUNTER — Telehealth: Payer: Self-pay | Admitting: Urology

## 2016-07-09 DIAGNOSIS — K5792 Diverticulitis of intestine, part unspecified, without perforation or abscess without bleeding: Secondary | ICD-10-CM

## 2016-07-09 LAB — MAGNESIUM: MAGNESIUM: 1.7 mg/dL (ref 1.7–2.4)

## 2016-07-09 LAB — CBC
HEMATOCRIT: 33.7 % — AB (ref 35.0–47.0)
Hemoglobin: 11.9 g/dL — ABNORMAL LOW (ref 12.0–16.0)
MCH: 33.4 pg (ref 26.0–34.0)
MCHC: 35.2 g/dL (ref 32.0–36.0)
MCV: 94.9 fL (ref 80.0–100.0)
PLATELETS: 185 10*3/uL (ref 150–440)
RBC: 3.55 MIL/uL — AB (ref 3.80–5.20)
RDW: 12.8 % (ref 11.5–14.5)
WBC: 7.7 10*3/uL (ref 3.6–11.0)

## 2016-07-09 LAB — POTASSIUM
Potassium: 3.3 mmol/L — ABNORMAL LOW (ref 3.5–5.1)
Potassium: 3.8 mmol/L (ref 3.5–5.1)

## 2016-07-09 LAB — BASIC METABOLIC PANEL
Anion gap: 7 (ref 5–15)
CO2: 28 mmol/L (ref 22–32)
Calcium: 7.3 mg/dL — ABNORMAL LOW (ref 8.9–10.3)
Chloride: 101 mmol/L (ref 101–111)
Creatinine, Ser: 0.72 mg/dL (ref 0.44–1.00)
Glucose, Bld: 94 mg/dL (ref 65–99)
POTASSIUM: 2.7 mmol/L — AB (ref 3.5–5.1)
SODIUM: 136 mmol/L (ref 135–145)

## 2016-07-09 MED ORDER — MAGNESIUM SULFATE 4 GM/100ML IV SOLN
4.0000 g | Freq: Once | INTRAVENOUS | Status: AC
  Administered 2016-07-09: 4 g via INTRAVENOUS
  Filled 2016-07-09: qty 100

## 2016-07-09 MED ORDER — METRONIDAZOLE 500 MG PO TABS
500.0000 mg | ORAL_TABLET | Freq: Three times a day (TID) | ORAL | Status: DC
  Administered 2016-07-09 – 2016-07-10 (×3): 500 mg via ORAL
  Filled 2016-07-09 (×3): qty 1

## 2016-07-09 MED ORDER — CIPROFLOXACIN HCL 500 MG PO TABS
500.0000 mg | ORAL_TABLET | Freq: Two times a day (BID) | ORAL | Status: DC
  Administered 2016-07-09 – 2016-07-10 (×2): 500 mg via ORAL
  Filled 2016-07-09 (×2): qty 1

## 2016-07-09 MED ORDER — POTASSIUM CHLORIDE 10 MEQ/100ML IV SOLN
10.0000 meq | INTRAVENOUS | Status: AC
  Administered 2016-07-09 (×3): 10 meq via INTRAVENOUS
  Filled 2016-07-09 (×3): qty 100

## 2016-07-09 MED ORDER — POTASSIUM CHLORIDE CRYS ER 20 MEQ PO TBCR
20.0000 meq | EXTENDED_RELEASE_TABLET | ORAL | Status: AC
  Administered 2016-07-09: 20 meq via ORAL
  Filled 2016-07-09: qty 1

## 2016-07-09 MED ORDER — POTASSIUM CHLORIDE IN NACL 20-0.9 MEQ/L-% IV SOLN
INTRAVENOUS | Status: DC
  Administered 2016-07-09: 23:00:00 via INTRAVENOUS
  Administered 2016-07-09: 1000 mL via INTRAVENOUS
  Administered 2016-07-10: 07:00:00 via INTRAVENOUS
  Filled 2016-07-09 (×5): qty 1000

## 2016-07-09 MED ORDER — POTASSIUM CHLORIDE CRYS ER 20 MEQ PO TBCR
40.0000 meq | EXTENDED_RELEASE_TABLET | ORAL | Status: DC
  Administered 2016-07-09: 40 meq via ORAL
  Filled 2016-07-09: qty 2

## 2016-07-09 MED ORDER — PIPERACILLIN-TAZOBACTAM 3.375 G IVPB
3.3750 g | Freq: Three times a day (TID) | INTRAVENOUS | Status: DC
  Administered 2016-07-09: 3.375 g via INTRAVENOUS
  Filled 2016-07-09 (×4): qty 50

## 2016-07-09 MED ORDER — POTASSIUM CHLORIDE CRYS ER 20 MEQ PO TBCR
40.0000 meq | EXTENDED_RELEASE_TABLET | Freq: Once | ORAL | Status: AC
  Administered 2016-07-09: 40 meq via ORAL
  Filled 2016-07-09: qty 2

## 2016-07-09 NOTE — Progress Notes (Signed)
Pharmacy Antibiotic Note  Yvette Martinez is a 66 y.o. female admitted on 07/07/2016 with IAI/salmonella GI.  Pharmacy has been consulted for ciprofloxacin dosing.  Plan: ID physician stops Zosyn, changes to 14 day course of Cipro/flagyl. Ciprofloxacin 500 mg PO Q12H x 14 days  Height: 5\' 2"  (157.5 cm) Weight: 133 lb (60.3 kg) IBW/kg (Calculated) : 50.1  Temp (24hrs), Avg:98.4 F (36.9 C), Min:97.8 F (36.6 C), Max:99.2 F (37.3 C)   Recent Labs Lab 07/07/16 1714 07/08/16 0739 07/09/16 0411  WBC 9.8  --  7.7  CREATININE 0.71 0.67 0.72    Estimated Creatinine Clearance: 58.4 mL/min (by C-G formula based on SCr of 0.72 mg/dL).    Allergies  Allergen Reactions  . Sulfa Antibiotics Hives    Thank you for allowing pharmacy to be a part of this patient's care.  Carola FrostNathan A Zanita Millman, Pharm.D., BCPS Clinical Pharmacist 07/09/2016 4:15 PM

## 2016-07-09 NOTE — Progress Notes (Addendum)
MEDICATION RELATED CONSULT NOTE -FOLLOW UP   Pharmacy Consult for Electrolyte management  Indication: hypokalemia  Allergies  Allergen Reactions  . Sulfa Antibiotics Hives    Patient Measurements: Height: 5\' 2"  (157.5 cm) Weight: 133 lb (60.3 kg) IBW/kg (Calculated) : 50.1 Adjusted Body Weight:   Vital Signs: Temp: 98.3 F (36.8 C) (05/11 0500) Temp Source: Oral (05/11 0500) BP: 109/49 (05/11 0500) Pulse Rate: 88 (05/11 0500) Intake/Output from previous day: 05/10 0701 - 05/11 0700 In: 967 [P.O.:240; I.V.:427; IV Piggyback:300] Out: 1300 [Urine:1300] Intake/Output from this shift: No intake/output data recorded.  Labs:  Recent Labs  07/07/16 1714 07/08/16 0739 07/09/16 0411  WBC 9.8  --  7.7  HGB 13.8  --  11.9*  HCT 38.6  --  33.7*  PLT 232  --  185  CREATININE 0.71 0.67 0.72  MG  --  2.0 1.7  ALBUMIN 3.1*  --   --   PROT 6.8  --   --   AST 34  --   --   ALT 18  --   --   ALKPHOS 78  --   --   BILITOT 0.8  --   --    Estimated Creatinine Clearance: 58.4 mL/min (by C-G formula based on SCr of 0.72 mg/dL).   Microbiology: Recent Results (from the past 720 hour(s))  C difficile quick scan w PCR reflex     Status: None   Collection Time: 07/08/16 12:54 AM  Result Value Ref Range Status   C Diff antigen NEGATIVE NEGATIVE Final   C Diff toxin NEGATIVE NEGATIVE Final   C Diff interpretation No C. difficile detected.  Final  Gastrointestinal Panel by PCR , Stool     Status: Abnormal   Collection Time: 07/08/16 12:54 AM  Result Value Ref Range Status   Campylobacter species NOT DETECTED NOT DETECTED Final   Plesimonas shigelloides NOT DETECTED NOT DETECTED Final   Salmonella species DETECTED (A) NOT DETECTED Final    Comment: CRITICAL RESULT CALLED TO, READ BACK BY AND VERIFIED WITH: KAREN SMITH AT 0744 ON 07/08/16 [facility].    Yersinia enterocolitica NOT DETECTED NOT DETECTED Final   Vibrio species NOT DETECTED NOT DETECTED Final   Vibrio cholerae NOT  DETECTED NOT DETECTED Final   Enteroaggregative E coli (EAEC) NOT DETECTED NOT DETECTED Final   Enteropathogenic E coli (EPEC) NOT DETECTED NOT DETECTED Final   Enterotoxigenic E coli (ETEC) NOT DETECTED NOT DETECTED Final   Shiga like toxin producing E coli (STEC) NOT DETECTED NOT DETECTED Final   Shigella/Enteroinvasive E coli (EIEC) NOT DETECTED NOT DETECTED Final   Cryptosporidium NOT DETECTED NOT DETECTED Final   Cyclospora cayetanensis NOT DETECTED NOT DETECTED Final   Entamoeba histolytica NOT DETECTED NOT DETECTED Final   Giardia lamblia NOT DETECTED NOT DETECTED Final   Adenovirus F40/41 NOT DETECTED NOT DETECTED Final   Astrovirus NOT DETECTED NOT DETECTED Final   Norovirus GI/GII NOT DETECTED NOT DETECTED Final   Rotavirus A NOT DETECTED NOT DETECTED Final   Sapovirus (I, II, IV, and V) NOT DETECTED NOT DETECTED Final    Medical History: Past Medical History:  Diagnosis Date  . COPD (chronic obstructive pulmonary disease) (HCC)   . Diverticula of colon     Medications:  Prescriptions Prior to Admission  Medication Sig Dispense Refill Last Dose  . ALPRAZolam (XANAX) 1 MG tablet Take 1 tablet by mouth every 6 (six) hours as needed for anxiety.    07/07/2016 at 1400  . antiseptic  oral rinse (CPC / CETYLPYRIDINIUM CHLORIDE 0.05%) 0.05 % LIQD solution 7 mLs by Mouth Rinse route 2 (two) times daily. 118 mL 0 prn at prn  . Fluticasone-Salmeterol (ADVAIR DISKUS) 100-50 MCG/DOSE AEPB Inhale 1 puff into the lungs 2 (two) times daily.   07/07/2016 at am  . menthol-cetylpyridinium (CEPACOL) 3 MG lozenge Take 1 lozenge by mouth as needed for sore throat.   prn at prn  . mirtazapine (REMERON) 30 MG tablet Take 30 mg by mouth 2 (two) times daily.    07/06/2016 at pm  . tiotropium (SPIRIVA) 18 MCG inhalation capsule Place 1 capsule into inhaler and inhale daily.   07/07/2016 at Unknown time  . VENTOLIN HFA 108 (90 Base) MCG/ACT inhaler Inhale 1-2 puffs into the lungs every 6 (six) hours as  needed.    prn at prn  . albuterol (PROVENTIL) (2.5 MG/3ML) 0.083% nebulizer solution Take 3 mLs (2.5 mg total) by nebulization every 4 (four) hours as needed for wheezing or shortness of breath. (Patient not taking: Reported on 07/07/2016) 75 mL 12 Not Taking at Unknown time  . bisacodyl (DULCOLAX) 10 MG suppository Place 1 suppository (10 mg total) rectally as needed for moderate constipation. (Patient not taking: Reported on 08/19/2015) 12 suppository 0 Not Taking at Unknown time  . levofloxacin (LEVAQUIN) 750 MG tablet Take 1 tablet (750 mg total) by mouth daily. (Patient not taking: Reported on 07/07/2016) 5 tablet 0 Completed Course at Unknown time  . metroNIDAZOLE (FLAGYL) 500 MG tablet Take 1 tablet (500 mg total) by mouth every 8 (eight) hours. (Patient not taking: Reported on 07/07/2016) 15 tablet 0 Completed Course at Unknown time  . nicotine (NICODERM CQ - DOSED IN MG/24 HOURS) 21 mg/24hr patch Place 1 patch (21 mg total) onto the skin daily. (Patient not taking: Reported on 07/07/2016) 28 patch 0 Not Taking at Unknown time  . predniSONE (STERAPRED UNI-PAK 21 TAB) 10 MG (21) TBPK tablet Take 1 tablet (10 mg total) by mouth daily. Please take 6 pills in the morning with breakfast on the day 1, then taper by one pill daily until finished. Thank you. (Patient not taking: Reported on 07/07/2016) 21 tablet 0 Completed Course at Unknown time   Scheduled:  . antiseptic oral rinse  7 mL Mouth Rinse BID  . ciprofloxacin  500 mg Oral BID  . enoxaparin (LOVENOX) injection  40 mg Subcutaneous Q24H  . mirtazapine  30 mg Oral BID  . mometasone-formoterol  2 puff Inhalation BID  . pneumococcal 23 valent vaccine  0.5 mL Intramuscular Tomorrow-1000  . potassium chloride  40 mEq Oral Q4H  . tiotropium  1 capsule Inhalation Daily    Assessment: Pharmacy consulted to manage electrolytes in this 66 year old woman   Goal of Therapy:  K= 3.5  Plan:   5/11: K= 2.7. Patient received KCl 10 mEq IV x 2 doses.  Patient also received 40 mEq Po x 1 @ 06:00. Will give an additional 20 mEq of PO K and will recheck K @ 11:30.   5/11: K @ ~12:00 = 3.3. Patient also receiving KCl 20 mEq in IVF. Will recheck electrolytes with am labs.     April Colter D 07/09/2016,9:10 AM

## 2016-07-09 NOTE — Consult Note (Signed)
[facility]ious [facility]     Reason for Consult: Salmonella and diverticulitis   Referring Physician: Clayburn Pert Date of Admission:  07/07/2016   Active Problems:   Diverticulitis  HPI: Yvette Martinez is a 66 y.o. female admitted with 2 weeks diarrhea, weakness and abd pain. Also noted gross hematuria. She has hx of diverticulits. CT scan showed inflammation in sigmoid colon consistent with diverticulitis with microperforation. WBC on admit was 9.8 and has been afebrile. Started on zosyn then changed to cipro and flagyl.  She reports multiple episodes of diverticulitis.  No animal exposure except her dog. No travel. Lives alone.   Past Medical History:  Diagnosis Date  . COPD (chronic obstructive pulmonary disease) ([place])   . Diverticula of colon    Past Surgical History:  Procedure Laterality Date  . ABDOMINAL HYSTERECTOMY     Social History  Substance Use Topics  . Smoking status: Current Every Day Smoker  . Smokeless tobacco: Never Used  . Alcohol use Not on file   No family history on file.  Allergies:  Allergies  Allergen Reactions  . Sulfa Antibiotics Hives    Current antibiotics: Antibiotics Given (last 72 hours)    Date/Time Action Medication Dose Rate   07/07/16 2319 New Bag/Given   piperacillin-tazobactam (ZOSYN) IVPB 3.375 g 3.375 g 100 mL/hr   07/08/16 0819 New Bag/Given   piperacillin-tazobactam (ZOSYN) IVPB 3.375 g 3.375 g 12.5 mL/hr   07/08/16 1437 New Bag/Given   metroNIDAZOLE (FLAGYL) IVPB 500 mg 500 mg 100 mL/hr   07/08/16 1438 Given   ciprofloxacin (CIPRO) tablet 500 mg 500 mg    07/08/16 1817 New Bag/Given   metroNIDAZOLE (FLAGYL) IVPB 500 mg 500 mg 100 mL/hr   07/08/16 2004 Given   ciprofloxacin (CIPRO) tablet 500 mg 500 mg    07/09/16 0335 New Bag/Given   metroNIDAZOLE (FLAGYL) IVPB 500 mg 500 mg 100 mL/hr   07/09/16 0950 Given   ciprofloxacin (CIPRO) tablet 500 mg 500 mg    07/09/16 1305 New Bag/Given    piperacillin-tazobactam (ZOSYN) IVPB 3.375 g 3.375 g 12.5 mL/hr      MEDICATIONS: . antiseptic oral rinse  7 mL Mouth Rinse BID  . enoxaparin (LOVENOX) injection  40 mg Subcutaneous Q24H  . mirtazapine  30 mg Oral BID  . mometasone-formoterol  2 puff Inhalation BID  . pneumococcal 23 valent vaccine  0.5 mL Intramuscular Tomorrow-1000  . tiotropium  1 capsule Inhalation Daily    Review of Systems - 11 systems reviewed and negative per HPI   OBJECTIVE: Temp:  [97.8 F (36.6 C)-99.2 F (37.3 C)] 97.8 F (36.6 C) (05/11 1304) Pulse Rate:  [81-98] 81 (05/11 1304) Resp:  [18] 18 (05/11 1304) BP: (96-110)/(49-66) 110/66 (05/11 1304) SpO2:  [91 %-94 %] 94 % (05/11 1304) Physical Exam  Constitutional:  oriented to person, place, and time. Disheveled HENT: [place]/AT, PERRLA, no scleral icterus Mouth/Throat: Oropharynx is clear and dry. No oropharyngeal exudate.  Cardiovascular: Normal rate, regular rhythm and normal heart sounds.  Pulmonary/Chest: Effort normal and breath sounds normal. No respiratory distress.  has no wheezes.  Neck = supple, no nuchal rigidity Abdominal: Soft. Bowel sounds are normal. Mild ttp RLQ Lymphadenopathy: no cervical adenopathy. No axillary adenopathy Neurological: alert and oriented to person, place, and time.  Skin: Skin is warm and dry. No rash noted. No erythema.  Psychiatric: a normal mood and affect.  behavior is normal.    LABS: Results for orders placed or performed during the hospital  encounter of 07/07/16 (from the past 48 hour(s))  Lipase, blood     Status: None   Collection Time: 07/07/16  5:14 PM  Result Value Ref Range   Lipase 22 11 - 51 U/L  Comprehensive metabolic panel     Status: Abnormal   Collection Time: 07/07/16  5:14 PM  Result Value Ref Range   Sodium 130 (L) 135 - 145 mmol/L   Potassium 2.4 (LL) 3.5 - 5.1 mmol/L    Comment: CRITICAL RESULT CALLED TO, READ BACK BY AND VERIFIED WITH OLIVIA BROOMER AT 1800 07/07/2016 BY TFK.     Chloride 86 (L) 101 - 111 mmol/L   CO2 33 (H) 22 - 32 mmol/L   Glucose, Bld 124 (H) 65 - 99 mg/dL   BUN 17 6 - 20 mg/dL   Creatinine, Ser 0.71 0.44 - 1.00 mg/dL   Calcium 8.5 (L) 8.9 - 10.3 mg/dL   Total Protein 6.8 6.5 - 8.1 g/dL   Albumin 3.1 (L) 3.5 - 5.0 g/dL   AST 34 15 - 41 U/L   ALT 18 14 - 54 U/L   Alkaline Phosphatase 78 38 - 126 U/L   Total Bilirubin 0.8 0.3 - 1.2 mg/dL   GFR calc non Af Amer >60 >60 mL/min   GFR calc Af Amer >60 >60 mL/min    Comment: (NOTE) The eGFR has been calculated using the CKD EPI equation. This calculation has not been validated in all clinical situations. eGFR's persistently <60 mL/min signify possible Chronic Kidney Disease.    Anion gap 11 5 - 15  CBC     Status: None   Collection Time: 07/07/16  5:14 PM  Result Value Ref Range   WBC 9.8 3.6 - 11.0 K/uL   RBC 4.08 3.80 - 5.20 MIL/uL   Hemoglobin 13.8 12.0 - 16.0 g/dL   HCT 38.6 35.0 - 47.0 %   MCV 94.5 80.0 - 100.0 fL   MCH 33.9 26.0 - 34.0 pg   MCHC 35.8 32.0 - 36.0 g/dL   RDW 12.8 11.5 - 14.5 %   Platelets 232 150 - 440 K/uL  Urinalysis, Complete w Microscopic     Status: Abnormal   Collection Time: 07/07/16  7:11 PM  Result Value Ref Range   Color, Urine YELLOW (A) YELLOW   APPearance HAZY (A) CLEAR   Specific Gravity, Urine 1.020 1.005 - 1.030   pH 6.0 5.0 - 8.0   Glucose, UA NEGATIVE NEGATIVE mg/dL   Hgb urine dipstick MODERATE (A) NEGATIVE   Bilirubin Urine NEGATIVE NEGATIVE   Ketones, ur 20 (A) NEGATIVE mg/dL   Protein, ur 100 (A) NEGATIVE mg/dL   Nitrite NEGATIVE NEGATIVE   Leukocytes, UA SMALL (A) NEGATIVE   RBC / HPF TOO NUMEROUS TO COUNT 0 - 5 RBC/hpf   WBC, UA TOO NUMEROUS TO COUNT 0 - 5 WBC/hpf   Bacteria, UA RARE (A) NONE SEEN   Squamous Epithelial / LPF 0-5 (A) NONE SEEN   Mucous PRESENT    Hyaline Casts, UA PRESENT   C difficile quick scan w PCR reflex     Status: None   Collection Time: 07/08/16 12:54 AM  Result Value Ref Range   C Diff antigen NEGATIVE  NEGATIVE   C Diff toxin NEGATIVE NEGATIVE   C Diff interpretation No C. difficile detected.   Gastrointestinal Panel by PCR , Stool     Status: Abnormal   Collection Time: 07/08/16 12:54 AM  Result Value Ref Range   Campylobacter species  NOT DETECTED NOT DETECTED   Plesimonas shigelloides NOT DETECTED NOT DETECTED   Salmonella species DETECTED (A) NOT DETECTED    Comment: CRITICAL RESULT CALLED TO, READ BACK BY AND VERIFIED WITH: KAREN SMITH AT 1478 ON 07/08/16 [place].    Yersinia enterocolitica NOT DETECTED NOT DETECTED   Vibrio species NOT DETECTED NOT DETECTED   Vibrio cholerae NOT DETECTED NOT DETECTED   Enteroaggregative E coli (EAEC) NOT DETECTED NOT DETECTED   Enteropathogenic E coli (EPEC) NOT DETECTED NOT DETECTED   Enterotoxigenic E coli (ETEC) NOT DETECTED NOT DETECTED   Shiga like toxin producing E coli (STEC) NOT DETECTED NOT DETECTED   Shigella/Enteroinvasive E coli (EIEC) NOT DETECTED NOT DETECTED   Cryptosporidium NOT DETECTED NOT DETECTED   Cyclospora cayetanensis NOT DETECTED NOT DETECTED   Entamoeba histolytica NOT DETECTED NOT DETECTED   Giardia lamblia NOT DETECTED NOT DETECTED   Adenovirus F40/41 NOT DETECTED NOT DETECTED   Astrovirus NOT DETECTED NOT DETECTED   Norovirus GI/GII NOT DETECTED NOT DETECTED   Rotavirus A NOT DETECTED NOT DETECTED   Sapovirus (I, II, IV, and V) NOT DETECTED NOT DETECTED  Basic metabolic panel     Status: Abnormal   Collection Time: 07/08/16  7:39 AM  Result Value Ref Range   Sodium 135 135 - 145 mmol/L   Potassium 2.4 (LL) 3.5 - 5.1 mmol/L    Comment: CRITICAL RESULT CALLED TO, READ BACK BY AND VERIFIED WITH KAREN [facility] 07/08/16 0821 SGD    Chloride 96 (L) 101 - 111 mmol/L   CO2 32 22 - 32 mmol/L   Glucose, Bld 85 65 - 99 mg/dL   BUN 9 6 - 20 mg/dL   Creatinine, Ser 0.67 0.44 - 1.00 mg/dL   Calcium 7.3 (L) 8.9 - 10.3 mg/dL   GFR calc non Af Amer >60 >60 mL/min   GFR calc Af Amer >60 >60 mL/min    Comment: (NOTE) The eGFR has  been calculated using the CKD EPI equation. This calculation has not been validated in all clinical situations. eGFR's persistently <60 mL/min signify possible Chronic Kidney Disease.    Anion gap 7 5 - 15  Magnesium     Status: None   Collection Time: 07/08/16  7:39 AM  Result Value Ref Range   Magnesium 2.0 1.7 - 2.4 mg/dL  Rapid HIV screen (HIV 1/2 Ab+Ag)     Status: None   Collection Time: 07/08/16  7:41 AM  Result Value Ref Range   HIV-1 P24 Antigen - HIV24 NON REACTIVE NON REACTIVE   HIV 1/2 Antibodies NON REACTIVE NON REACTIVE   Interpretation (HIV Ag Ab)      A non reactive test result means that HIV 1 or HIV 2 antibodies and HIV 1 p24 antigen were not detected in the specimen.  Potassium     Status: Abnormal   Collection Time: 07/08/16  1:58 PM  Result Value Ref Range   Potassium 2.6 (LL) 3.5 - 5.1 mmol/L    Comment: CRITICAL RESULT CALLED TO, READ BACK BY AND VERIFIED WITH KAREN SMITH 07/08/16 1426 SGD   Potassium     Status: None   Collection Time: 07/08/16  8:52 PM  Result Value Ref Range   Potassium 3.5 3.5 - 5.1 mmol/L  Magnesium     Status: None   Collection Time: 07/09/16  4:11 AM  Result Value Ref Range   Magnesium 1.7 1.7 - 2.4 mg/dL  Basic metabolic panel     Status: Abnormal   Collection Time:  07/09/16  4:11 AM  Result Value Ref Range   Sodium 136 135 - 145 mmol/L   Potassium 2.7 (LL) 3.5 - 5.1 mmol/L    Comment: CRITICAL RESULT CALLED TO, READ BACK BY AND VERIFIED WITH MARCELLA TURNER AT 0525 ON 07/09/16 [place].    Chloride 101 101 - 111 mmol/L   CO2 28 22 - 32 mmol/L   Glucose, Bld 94 65 - 99 mg/dL   BUN <5 (L) 6 - 20 mg/dL   Creatinine, Ser 0.72 0.44 - 1.00 mg/dL   Calcium 7.3 (L) 8.9 - 10.3 mg/dL   GFR calc non Af Amer >60 >60 mL/min   GFR calc Af Amer >60 >60 mL/min    Comment: (NOTE) The eGFR has been calculated using the CKD EPI equation. This calculation has not been validated in all clinical situations. eGFR's persistently <60 mL/min signify  possible Chronic Kidney Disease.    Anion gap 7 5 - 15  CBC     Status: Abnormal   Collection Time: 07/09/16  4:11 AM  Result Value Ref Range   WBC 7.7 3.6 - 11.0 K/uL   RBC 3.55 (L) 3.80 - 5.20 MIL/uL   Hemoglobin 11.9 (L) 12.0 - 16.0 g/dL   HCT 33.7 (L) 35.0 - 47.0 %   MCV 94.9 80.0 - 100.0 fL   MCH 33.4 26.0 - 34.0 pg   MCHC 35.2 32.0 - 36.0 g/dL   RDW 12.8 11.5 - 14.5 %   Platelets 185 150 - 440 K/uL  Potassium     Status: Abnormal   Collection Time: 07/09/16 11:48 AM  Result Value Ref Range   Potassium 3.3 (L) 3.5 - 5.1 mmol/L  Potassium     Status: None   Collection Time: 07/09/16  1:49 PM  Result Value Ref Range   Potassium 3.8 3.5 - 5.1 mmol/L   No components found for: ESR, C REACTIVE PROTEIN MICRO: Recent Results (from the past 720 hour(s))  C difficile quick scan w PCR reflex     Status: None   Collection Time: 07/08/16 12:54 AM  Result Value Ref Range Status   C Diff antigen NEGATIVE NEGATIVE Final   C Diff toxin NEGATIVE NEGATIVE Final   C Diff interpretation No C. difficile detected.  Final  Gastrointestinal Panel by PCR , Stool     Status: Abnormal   Collection Time: 07/08/16 12:54 AM  Result Value Ref Range Status   Campylobacter species NOT DETECTED NOT DETECTED Final   Plesimonas shigelloides NOT DETECTED NOT DETECTED Final   Salmonella species DETECTED (A) NOT DETECTED Final    Comment: CRITICAL RESULT CALLED TO, READ BACK BY AND VERIFIED WITH: KAREN SMITH AT 1610 ON 07/08/16 [place].    Yersinia enterocolitica NOT DETECTED NOT DETECTED Final   Vibrio species NOT DETECTED NOT DETECTED Final   Vibrio cholerae NOT DETECTED NOT DETECTED Final   Enteroaggregative E coli (EAEC) NOT DETECTED NOT DETECTED Final   Enteropathogenic E coli (EPEC) NOT DETECTED NOT DETECTED Final   Enterotoxigenic E coli (ETEC) NOT DETECTED NOT DETECTED Final   Shiga like toxin producing E coli (STEC) NOT DETECTED NOT DETECTED Final   Shigella/Enteroinvasive E coli (EIEC) NOT  DETECTED NOT DETECTED Final   Cryptosporidium NOT DETECTED NOT DETECTED Final   Cyclospora cayetanensis NOT DETECTED NOT DETECTED Final   Entamoeba histolytica NOT DETECTED NOT DETECTED Final   Giardia lamblia NOT DETECTED NOT DETECTED Final   Adenovirus F40/41 NOT DETECTED NOT DETECTED Final   Astrovirus NOT DETECTED NOT  DETECTED Final   Norovirus GI/GII NOT DETECTED NOT DETECTED Final   Rotavirus A NOT DETECTED NOT DETECTED Final   Sapovirus (I, II, IV, and V) NOT DETECTED NOT DETECTED Final    IMAGING: Ct Abdomen Pelvis W Contrast  Result Date: 07/07/2016 CLINICAL DATA:  C/O LLQ abdomen pain, diarrhea and hematuria x 2 weeks. ^147m ISOVUE-300 IOPAMIDOL (ISOVUE-300) INJECTION 61% EXAM: CT ABDOMEN AND PELVIS WITH CONTRAST TECHNIQUE: Multidetector CT imaging of the abdomen and pelvis was performed using the standard protocol following bolus administration of intravenous contrast. CONTRAST:  107mISOVUE-300 IOPAMIDOL (ISOVUE-300) INJECTION 61% COMPARISON:  08/20/2015 FINDINGS: Lower chest: There is minimal atelectasis at both lung bases. Heart size is normal. Hepatobiliary: No focal liver abnormality is seen. No gallstones, gallbladder wall thickening, or biliary dilatation. Pancreas: Unremarkable. No pancreatic ductal dilatation or surrounding inflammatory changes. Spleen: Normal in size without focal abnormality. Adrenals/Urinary Tract: The adrenal glands are normal. No hydronephrosis or ureteral obstruction. No renal mass. Stomach/Bowel: The stomach and small bowel loops are normal in appearance. The appendix is well seen and has a normal appearance. There is diffuse, marked thickening of the sigmoid colon, associated with scattered diverticula. Fluid tracks along the lower transverse mesocolon superior to the dome of the bladder, compressing the left lateral aspect of the bladder. There is stranding along the fat plane between the colon and the bladder and the findings are consistent consistent  with micro perforation. No free intraperitoneal air or abscess. Despite sigmoid colon thickening, there is no obstruction to the contrast material. Vascular/Lymphatic: There is atherosclerotic calcification of the abdominal aorta. No aneurysm. There is contrast opacification of the celiac axis, SMA, and IMA. Reproductive: Status post hysterectomy.  No adnexal mass. Other: No free pelvic fluid. Musculoskeletal: No acute or significant osseous findings. IMPRESSION: 1. Inflamed sigmoid colon, likely related to colonic diverticulitis. 2. Micro perforation of the mid sigmoid colon. 3. Inflammation involves the left lateral aspect of the urinary bladder and the dome of the bladder, possibly accounting for the patient's hematuria. 4. No free intraperitoneal air or abscess. 5. Status post hysterectomy. 6.  Aortic atherosclerosis. Electronically Signed   By: ElN[place]on Nations.D.   On: 07/07/2016 22:00    Assessment:   Yvette PROSSERs a [age].o. female with hx recurrent diverticulitis now with repeat episode and with salmonella on Stool PCR. She is clinically improving but still ttp in LLQ.  Recommendations Continue cipro and flagyl for a 14 day course No specific work up needed for the salmonella. ? If should have elective surgery for the diverticulitis when she is stable given recurrences.  Thank you very much for allowing me to participate in the care of this patient. Please call with questions.   DaCheral MarkerFiOla SpurrMD

## 2016-07-09 NOTE — Telephone Encounter (Signed)
-----   Message from Hildred LaserBrian James Budzyn, MD sent at 07/08/2016  2:41 PM EDT ----- Patient needs to see me for office cysto in 4 weeks. She is still in the hospital. thanks

## 2016-07-09 NOTE — Progress Notes (Signed)
PT Cancellation Note  Patient Details Name: Yvette Martinez MRN: [id number] DOB: [birth year]   Cancelled Treatment:    Reason Eval/Treat Not Completed: Medical issues which prohibited therapy. Pt's potassium is noted to be 2.7 this morning. Per PT protocol for low potassium, PT will hold evaluation at this time. PT will re-attempt evaluation at later/date time as medically appropriate.   Ivy Puryear, SPT 07/09/2016, 8:42 AM

## 2016-07-09 NOTE — Progress Notes (Signed)
ANTIBIOTIC CONSULT NOTE - INITIAL  Pharmacy Consult for Zosyn Indication: intrabdominal infection/  Allergies  Allergen Reactions  . Sulfa Antibiotics Hives    Patient Measurements: Height: 5\' 2"  (157.5 cm) Weight: 133 lb (60.3 kg) IBW/kg (Calculated) : 50.1 Adjusted Body Weight:   Vital Signs: Temp: 98.3 F (36.8 C) (05/11 0500) Temp Source: Oral (05/11 0500) BP: 109/49 (05/11 0500) Pulse Rate: 88 (05/11 0500) Intake/Output from previous day: 05/10 0701 - 05/11 0700 In: 967 [P.O.:240; I.V.:427; IV Piggyback:300] Out: 1300 [Urine:1300] Intake/Output from this shift: No intake/output data recorded.  Labs:  Recent Labs  07/07/16 1714 07/08/16 0739 07/09/16 0411  WBC 9.8  --  7.7  HGB 13.8  --  11.9*  PLT 232  --  185  CREATININE 0.71 0.67 0.72   Estimated Creatinine Clearance: 58.4 mL/min (by C-G formula based on SCr of 0.72 mg/dL). No results for input(s): VANCOTROUGH, VANCOPEAK, VANCORANDOM, GENTTROUGH, GENTPEAK, GENTRANDOM, TOBRATROUGH, TOBRAPEAK, TOBRARND, AMIKACINPEAK, AMIKACINTROU, AMIKACIN in the last 72 hours.   Microbiology: Recent Results (from the past 720 hour(s))  C difficile quick scan w PCR reflex     Status: None   Collection Time: 07/08/16 12:54 AM  Result Value Ref Range Status   C Diff antigen NEGATIVE NEGATIVE Final   C Diff toxin NEGATIVE NEGATIVE Final   C Diff interpretation No C. difficile detected.  Final  Gastrointestinal Panel by PCR , Stool     Status: Abnormal   Collection Time: 07/08/16 12:54 AM  Result Value Ref Range Status   Campylobacter species NOT DETECTED NOT DETECTED Final   Plesimonas shigelloides NOT DETECTED NOT DETECTED Final   Salmonella species DETECTED (A) NOT DETECTED Final    Comment: CRITICAL RESULT CALLED TO, READ BACK BY AND VERIFIED WITH: KAREN SMITH AT 0744 ON 07/08/16 [facility].    Yersinia enterocolitica NOT DETECTED NOT DETECTED Final   Vibrio species NOT DETECTED NOT DETECTED Final   Vibrio cholerae NOT  DETECTED NOT DETECTED Final   Enteroaggregative E coli (EAEC) NOT DETECTED NOT DETECTED Final   Enteropathogenic E coli (EPEC) NOT DETECTED NOT DETECTED Final   Enterotoxigenic E coli (ETEC) NOT DETECTED NOT DETECTED Final   Shiga like toxin producing E coli (STEC) NOT DETECTED NOT DETECTED Final   Shigella/Enteroinvasive E coli (EIEC) NOT DETECTED NOT DETECTED Final   Cryptosporidium NOT DETECTED NOT DETECTED Final   Cyclospora cayetanensis NOT DETECTED NOT DETECTED Final   Entamoeba histolytica NOT DETECTED NOT DETECTED Final   Giardia lamblia NOT DETECTED NOT DETECTED Final   Adenovirus F40/41 NOT DETECTED NOT DETECTED Final   Astrovirus NOT DETECTED NOT DETECTED Final   Norovirus GI/GII NOT DETECTED NOT DETECTED Final   Rotavirus A NOT DETECTED NOT DETECTED Final   Sapovirus (I, II, IV, and V) NOT DETECTED NOT DETECTED Final    Medical History: Past Medical History:  Diagnosis Date  . COPD (chronic obstructive pulmonary disease) ([facility])   . Diverticula of colon     Medications:  Prescriptions Prior to Admission  Medication Sig Dispense Refill Last Dose  . ALPRAZolam (XANAX) 1 MG tablet Take 1 tablet by mouth every 6 (six) hours as needed for anxiety.    07/07/2016 at 1400  . antiseptic oral rinse (CPC / CETYLPYRIDINIUM CHLORIDE 0.05%) 0.05 % LIQD solution 7 mLs by Mouth Rinse route 2 (two) times daily. 118 mL 0 prn at prn  . Fluticasone-Salmeterol (ADVAIR DISKUS) 100-50 MCG/DOSE AEPB Inhale 1 puff into the lungs 2 (two) times daily.   07/07/2016 at am  .  menthol-cetylpyridinium (CEPACOL) 3 MG lozenge Take 1 lozenge by mouth as needed for sore throat.   prn at prn  . mirtazapine (REMERON) 30 MG tablet Take 30 mg by mouth 2 (two) times daily.    07/06/2016 at pm  . tiotropium (SPIRIVA) 18 MCG inhalation capsule Place 1 capsule into inhaler and inhale daily.   07/07/2016 at Unknown time  . VENTOLIN HFA 108 (90 Base) MCG/ACT inhaler Inhale 1-2 puffs into the lungs every 6 (six) hours as  needed.    prn at prn  . albuterol (PROVENTIL) (2.5 MG/3ML) 0.083% nebulizer solution Take 3 mLs (2.5 mg total) by nebulization every 4 (four) hours as needed for wheezing or shortness of breath. (Patient not taking: Reported on 07/07/2016) 75 mL 12 Not Taking at Unknown time  . bisacodyl (DULCOLAX) 10 MG suppository Place 1 suppository (10 mg total) rectally as needed for moderate constipation. (Patient not taking: Reported on 08/19/2015) 12 suppository 0 Not Taking at Unknown time  . levofloxacin (LEVAQUIN) 750 MG tablet Take 1 tablet (750 mg total) by mouth daily. (Patient not taking: Reported on 07/07/2016) 5 tablet 0 Completed Course at Unknown time  . metroNIDAZOLE (FLAGYL) 500 MG tablet Take 1 tablet (500 mg total) by mouth every 8 (eight) hours. (Patient not taking: Reported on 07/07/2016) 15 tablet 0 Completed Course at Unknown time  . nicotine (NICODERM CQ - DOSED IN MG/24 HOURS) 21 mg/24hr patch Place 1 patch (21 mg total) onto the skin daily. (Patient not taking: Reported on 07/07/2016) 28 patch 0 Not Taking at Unknown time  . predniSONE (STERAPRED UNI-PAK 21 TAB) 10 MG (21) TBPK tablet Take 1 tablet (10 mg total) by mouth daily. Please take 6 pills in the morning with breakfast on the day 1, then taper by one pill daily until finished. Thank you. (Patient not taking: Reported on 07/07/2016) 21 tablet 0 Completed Course at Unknown time   Scheduled:  . antiseptic oral rinse  7 mL Mouth Rinse BID  . enoxaparin (LOVENOX) injection  40 mg Subcutaneous Q24H  . mirtazapine  30 mg Oral BID  . mometasone-formoterol  2 puff Inhalation BID  . pneumococcal 23 valent vaccine  0.5 mL Intramuscular Tomorrow-1000  . potassium chloride  40 mEq Oral Q4H  . tiotropium  1 capsule Inhalation Daily   Assessment: Pharmacy consulted to dose Zosyn in this 66 year old woman being treated for Salmonella currently on Cipro. MD would like to add Zosyn therapy for intra-abdominal infection as well.   Goal of Therapy:     Plan:  Will give Zosyn 3.375 g IV q8 hours.   Keevin Panebianco D 07/09/2016,11:48 AM

## 2016-07-09 NOTE — Progress Notes (Signed)
ANTIBIOTIC CONSULT NOTE - INITIAL  Pharmacy Consult for Ciprofloxacin Indication: Salmonella GI poisoning   Allergies  Allergen Reactions  . Sulfa Antibiotics Hives    Patient Measurements: Height: 5\' 2"  (157.5 cm) Weight: 133 lb (60.3 kg) IBW/kg (Calculated) : 50.1 Adjusted Body Weight:   Vital Signs: Temp: 98.3 F (36.8 C) (05/11 0500) Temp Source: Oral (05/11 0500) BP: 109/49 (05/11 0500) Pulse Rate: 88 (05/11 0500) Intake/Output from previous day: 05/10 0701 - 05/11 0700 In: 967 [P.O.:240; I.V.:427; IV Piggyback:300] Out: 1300 [Urine:1300] Intake/Output from this shift: No intake/output data recorded.  Labs:  Recent Labs  07/07/16 1714 07/08/16 0739 07/09/16 0411  WBC 9.8  --  7.7  HGB 13.8  --  11.9*  PLT 232  --  185  CREATININE 0.71 0.67 0.72   Estimated Creatinine Clearance: 58.4 mL/min (by C-G formula based on SCr of 0.72 mg/dL). No results for input(s): VANCOTROUGH, VANCOPEAK, VANCORANDOM, GENTTROUGH, GENTPEAK, GENTRANDOM, TOBRATROUGH, TOBRAPEAK, TOBRARND, AMIKACINPEAK, AMIKACINTROU, AMIKACIN in the last 72 hours.   Microbiology: Recent Results (from the past 720 hour(s))  C difficile quick scan w PCR reflex     Status: None   Collection Time: 07/08/16 12:54 AM  Result Value Ref Range Status   C Diff antigen NEGATIVE NEGATIVE Final   C Diff toxin NEGATIVE NEGATIVE Final   C Diff interpretation No C. difficile detected.  Final  Gastrointestinal Panel by PCR , Stool     Status: Abnormal   Collection Time: 07/08/16 12:54 AM  Result Value Ref Range Status   Campylobacter species NOT DETECTED NOT DETECTED Final   Plesimonas shigelloides NOT DETECTED NOT DETECTED Final   Salmonella species DETECTED (A) NOT DETECTED Final    Comment: CRITICAL RESULT CALLED TO, READ BACK BY AND VERIFIED WITH: KAREN SMITH AT 0744 ON 07/08/16 [facility].    Yersinia enterocolitica NOT DETECTED NOT DETECTED Final   Vibrio species NOT DETECTED NOT DETECTED Final   Vibrio cholerae  NOT DETECTED NOT DETECTED Final   Enteroaggregative E coli (EAEC) NOT DETECTED NOT DETECTED Final   Enteropathogenic E coli (EPEC) NOT DETECTED NOT DETECTED Final   Enterotoxigenic E coli (ETEC) NOT DETECTED NOT DETECTED Final   Shiga like toxin producing E coli (STEC) NOT DETECTED NOT DETECTED Final   Shigella/Enteroinvasive E coli (EIEC) NOT DETECTED NOT DETECTED Final   Cryptosporidium NOT DETECTED NOT DETECTED Final   Cyclospora cayetanensis NOT DETECTED NOT DETECTED Final   Entamoeba histolytica NOT DETECTED NOT DETECTED Final   Giardia lamblia NOT DETECTED NOT DETECTED Final   Adenovirus F40/41 NOT DETECTED NOT DETECTED Final   Astrovirus NOT DETECTED NOT DETECTED Final   Norovirus GI/GII NOT DETECTED NOT DETECTED Final   Rotavirus A NOT DETECTED NOT DETECTED Final   Sapovirus (I, II, IV, and V) NOT DETECTED NOT DETECTED Final    Medical History: Past Medical History:  Diagnosis Date  . COPD (chronic obstructive pulmonary disease) (HCC)   . Diverticula of colon     Medications:  Prescriptions Prior to Admission  Medication Sig Dispense Refill Last Dose  . ALPRAZolam (XANAX) 1 MG tablet Take 1 tablet by mouth every 6 (six) hours as needed for anxiety.    07/07/2016 at 1400  . antiseptic oral rinse (CPC / CETYLPYRIDINIUM CHLORIDE 0.05%) 0.05 % LIQD solution 7 mLs by Mouth Rinse route 2 (two) times daily. 118 mL 0 prn at prn  . Fluticasone-Salmeterol (ADVAIR DISKUS) 100-50 MCG/DOSE AEPB Inhale 1 puff into the lungs 2 (two) times daily.   07/07/2016  at am  . menthol-cetylpyridinium (CEPACOL) 3 MG lozenge Take 1 lozenge by mouth as needed for sore throat.   prn at prn  . mirtazapine (REMERON) 30 MG tablet Take 30 mg by mouth 2 (two) times daily.    07/06/2016 at pm  . tiotropium (SPIRIVA) 18 MCG inhalation capsule Place 1 capsule into inhaler and inhale daily.   07/07/2016 at Unknown time  . VENTOLIN HFA 108 (90 Base) MCG/ACT inhaler Inhale 1-2 puffs into the lungs every 6 (six) hours as  needed.    prn at prn  . albuterol (PROVENTIL) (2.5 MG/3ML) 0.083% nebulizer solution Take 3 mLs (2.5 mg total) by nebulization every 4 (four) hours as needed for wheezing or shortness of breath. (Patient not taking: Reported on 07/07/2016) 75 mL 12 Not Taking at Unknown time  . bisacodyl (DULCOLAX) 10 MG suppository Place 1 suppository (10 mg total) rectally as needed for moderate constipation. (Patient not taking: Reported on 08/19/2015) 12 suppository 0 Not Taking at Unknown time  . levofloxacin (LEVAQUIN) 750 MG tablet Take 1 tablet (750 mg total) by mouth daily. (Patient not taking: Reported on 07/07/2016) 5 tablet 0 Completed Course at Unknown time  . metroNIDAZOLE (FLAGYL) 500 MG tablet Take 1 tablet (500 mg total) by mouth every 8 (eight) hours. (Patient not taking: Reported on 07/07/2016) 15 tablet 0 Completed Course at Unknown time  . nicotine (NICODERM CQ - DOSED IN MG/24 HOURS) 21 mg/24hr patch Place 1 patch (21 mg total) onto the skin daily. (Patient not taking: Reported on 07/07/2016) 28 patch 0 Not Taking at Unknown time  . predniSONE (STERAPRED UNI-PAK 21 TAB) 10 MG (21) TBPK tablet Take 1 tablet (10 mg total) by mouth daily. Please take 6 pills in the morning with breakfast on the day 1, then taper by one pill daily until finished. Thank you. (Patient not taking: Reported on 07/07/2016) 21 tablet 0 Completed Course at Unknown time   Scheduled:  . antiseptic oral rinse  7 mL Mouth Rinse BID  . ciprofloxacin  500 mg Oral BID  . enoxaparin (LOVENOX) injection  40 mg Subcutaneous Q24H  . mirtazapine  30 mg Oral BID  . mometasone-formoterol  2 puff Inhalation BID  . pneumococcal 23 valent vaccine  0.5 mL Intramuscular Tomorrow-1000  . potassium chloride  20 mEq Oral STAT  . potassium chloride  40 mEq Oral Q4H  . tiotropium  1 capsule Inhalation Daily   Assessment: Pharmacy consulted to dose and monitor Cipro for Salmonella GI poisoning. Patient is also currently on Metronidazole  Goal of  Therapy:    Plan:  Will continue Cipro 500 mg PO BID.   Cynde Menard D 07/09/2016,9:19 AM

## 2016-07-09 NOTE — Progress Notes (Signed)
CC: Diverticulitis Subjective: Patient reports and seems to not be doing as well today. Upon questioning the complaints are mostly social. Has been tolerating a diet but complains about when she gets to eat. He has been having bowel function. No real complaints of abdominal pain.  Objective: Vital signs in last 24 hours: Temp:  [98.3 F (36.8 C)-99.2 F (37.3 C)] 98.3 F (36.8 C) (05/11 0500) Pulse Rate:  [88-98] 88 (05/11 0500) Resp:  [18] 18 (05/11 0500) BP: (96-109)/(49-50) 109/49 (05/11 0500) SpO2:  [91 %-93 %] 91 % (05/11 0500) Last BM Date: 07/08/16  Intake/Output from previous day: 05/10 0701 - 05/11 0700 In: 967 [P.O.:240; I.V.:427; IV Piggyback:300] Out: 1300 [Urine:1300] Intake/Output this shift: No intake/output data recorded.  Physical exam:  Gen.: No acute distress Chest: Clear to auscultation Heart: Regular rhythm Abdomen: Soft, minimally distended, nontender  Lab Results: CBC   Recent Labs  07/07/16 1714 07/09/16 0411  WBC 9.8 7.7  HGB 13.8 11.9*  HCT 38.6 33.7*  PLT 232 185   BMET  Recent Labs  07/08/16 0739  07/09/16 0411 07/09/16 1148  NA 135  --  136  --   K 2.4*  < > 2.7* 3.3*  CL 96*  --  101  --   CO2 32  --  28  --   GLUCOSE 85  --  94  --   BUN 9  --  <5*  --   CREATININE 0.67  --  0.72  --   CALCIUM 7.3*  --  7.3*  --   < > = values in this interval not displayed. PT/INR No results for input(s): LABPROT, INR in the last 72 hours. ABG No results for input(s): PHART, HCO3 in the last 72 hours.  Invalid input(s): PCO2, PO2  Studies/Results: Ct Abdomen Pelvis W Contrast  Result Date: 07/07/2016 CLINICAL DATA:  C/O LLQ abdomen pain, diarrhea and hematuria x 2 weeks. ^179mL ISOVUE-300 IOPAMIDOL (ISOVUE-300) INJECTION 61% EXAM: CT ABDOMEN AND PELVIS WITH CONTRAST TECHNIQUE: Multidetector CT imaging of the abdomen and pelvis was performed using the standard protocol following bolus administration of intravenous contrast. CONTRAST:   ISOVUE-300 IOPAMIDOL (ISOVUE-300) INJECTION 61% COMPARISON:  08/20/2015 FINDINGS: Lower chest: There is minimal atelectasis at both lung bases. Heart size is normal. Hepatobiliary: No focal liver abnormality is seen. No gallstones, gallbladder wall thickening, or biliary dilatation. Pancreas: Unremarkable. No pancreatic ductal dilatation or surrounding inflammatory changes. Spleen: Normal in size without focal abnormality. Adrenals/Urinary Tract: The adrenal glands are normal. No hydronephrosis or ureteral obstruction. No renal mass. Stomach/Bowel: The stomach and small bowel loops are normal in appearance. The appendix is well seen and has a normal appearance. There is diffuse, marked thickening of the sigmoid colon, associated with scattered diverticula. Fluid tracks along the lower transverse mesocolon superior to the dome of the bladder, compressing the left lateral aspect of the bladder. There is stranding along the fat plane between the colon and the bladder and the findings are consistent consistent with micro perforation. No free intraperitoneal air or abscess. Despite sigmoid colon thickening, there is no obstruction to the contrast material. Vascular/Lymphatic: There is atherosclerotic calcification of the abdominal aorta. No aneurysm. There is contrast opacification of the celiac axis, SMA, and IMA. Reproductive: Status post hysterectomy.  No adnexal mass. Other: No free pelvic fluid. Musculoskeletal: No acute or significant osseous findings. IMPRESSION: 1. Inflamed sigmoid colon, likely related to colonic diverticulitis. 2. Micro perforation of the mid sigmoid colon. 3. Inflammation involves the left lateral  aspect of the urinary bladder and the dome of the bladder, possibly accounting for the patient's hematuria. 4. No free intraperitoneal air or abscess. 5. Status post hysterectomy. 6.  Aortic atherosclerosis. Electronically Signed   By: Norva PavlovElizabeth  Brown M.D.   On: 07/07/2016 22:00     Anti-infectives: Anti-infectives    Start     Dose/Rate Route Frequency Ordered Stop   07/09/16 1200  piperacillin-tazobactam (ZOSYN) IVPB 3.375 g     3.375 g 12.5 mL/hr over 240 Minutes Intravenous Every 8 hours 07/09/16 1148     07/08/16 1100  metroNIDAZOLE (FLAGYL) IVPB 500 mg  Status:  Discontinued     500 mg 100 mL/hr over 60 Minutes Intravenous Every 8 hours 07/08/16 1008 07/09/16 1145   07/08/16 1045  ciprofloxacin (CIPRO) tablet 500 mg  Status:  Discontinued     500 mg Oral 2 times daily 07/08/16 1040 07/09/16 1145   07/08/16 0700  piperacillin-tazobactam (ZOSYN) IVPB 3.375 g  Status:  Discontinued     3.375 g 12.5 mL/hr over 240 Minutes Intravenous Every 8 hours 07/08/16 0642 07/08/16 1008   07/08/16 0115  piperacillin-tazobactam (ZOSYN) IVPB 3.375 g  Status:  Discontinued     3.375 g 100 mL/hr over 30 Minutes Intravenous  Once 07/08/16 0106 07/08/16 0111   07/07/16 2300  piperacillin-tazobactam (ZOSYN) IVPB 3.375 g     3.375 g 100 mL/hr over 30 Minutes Intravenous  Once 07/07/16 2250 07/08/16 0011      Assessment/Plan:  66 year old female with multiple medical problems and diverticulitis with microperforation. Appears to be responding to antibiotic therapy. No current plans for urgent surgical intervention. Okay for diet advancement and transitioned to oral antibiotics when indicated by the primary team. The Gen. surgery services will continue to follow with you while this patient as an inpatient.  Jacee Enerson T. Tonita CongWoodham, MD, [facility] Center[facility]ACS General Surgeon [facility]  Day [facility] [phone]) [phone] Night [facility] [phone] 07/09/2016

## 2016-07-09 NOTE — Care Management Important Message (Signed)
Important Message  Patient Details  Name: Yvette Martinez F Raymond MRN: [id number] Date of Birth: 06-08-1950   Medicare Important Message Given:  Yes    Chapman FitchBOWEN, Aluel Schwarz T, RN 07/09/2016, 2:02 PM

## 2016-07-09 NOTE — Progress Notes (Signed)
Dr. Tobi BastosPyreddy notified of critical K+ of 2.7. Acknowledged. New order written. Windy Carinaurner,Colt Martelle K, RN 5:48 AM 07/09/2016

## 2016-07-09 NOTE — Plan of Care (Signed)
Problem: Physical Regulation: Goal: Ability to maintain clinical measurements within normal limits will improve Outcome: Progressing Hypokalemia improving after several runs of KCL via IVPB.

## 2016-07-09 NOTE — Progress Notes (Signed)
PT Cancellation Note  Patient Details Name: [name] Gesner MRN: [id number] DOB: [birth year]   Cancelled Treatment:    Reason Eval/Treat Not Completed: Other (comment). Consult received and chart reviewed. K+ now in normal range for therapy, however notes report pt independent with all ADLs with no need for assistive devices. Discussed with RN. No indication for therapy at this time. Will dc current order. Please re-order if needs change.   Dotti Busey 07/09/2016, 2:56 PM  Elizabeth PalauStephanie Prabhjot Piscitello, PT, DPT [phone]

## 2016-07-09 NOTE — Progress Notes (Signed)
[facility] [place] [facility]   PATIENT NAME: Yvette Martinez    MR#:  [id number]  DATE OF BIRTH:  [birth year]  SUBJECTIVE:   "I'm not improving", continue to have diarrhea, some left lower quadrant abdominal pain.  REVIEW OF SYSTEMS:    Review of Systems  Constitutional: Negative for fever, chills weight loss HENT: Negative for ear pain, nosebleeds, congestion, facial swelling, rhinorrhea, neck pain, neck stiffness and ear discharge.   Respiratory: Negative for cough, shortness of breath, wheezing  Cardiovascular: Negative for chest pain, palpitations and leg swelling.  Gastrointestinal: Negative for heartburn, Admits of left lower quadrant abdominal discomfort with palpation. No, vomiting, admits of diarrhea , no significant improvement, loose stool up to 10 times a day, per patient Genitourinary: Negative for dysuria, urgency, frequency, hematuria Musculoskeletal: Negative for back pain or joint pain Neurological: Negative for dizziness, seizures, syncope, focal weakness,  numbness and headaches.  Hematological: Does not bruise/bleed easily.  Psychiatric/Behavioral: Negative for hallucinations, confusion, dysphoric mood    Tolerating Diet: npo      DRUG ALLERGIES:   Allergies  Allergen Reactions  . Sulfa Antibiotics Hives    VITALS:  Blood pressure 110/66, pulse 81, temperature 97.8 F (36.6 C), temperature source Oral, resp. rate 18, height 5\' 2"  (1.575 m), weight 60.3 kg (133 lb), SpO2 94 %.  PHYSICAL EXAMINATION:  Constitutional: Appears well-developed and well-nourished. No distress. HENT: Normocephalic. Marland Kitchen. Oropharynx is clear and moist.  Eyes: Conjunctivae and EOM are normal. PERRLA, no scleral icterus.  Neck: Normal ROM. Neck supple. No JVD. No tracheal deviation. CVS: RRR, S1/S2 +, no murmurs, no gallops, no carotid bruit.  Pulmonary: Effort and breath sounds normal, no stridor, rhonchi, wheezes, rales.  Abdominal: Soft. BS +,  no  distension, Mild left lower quadrant tenderness without rebound or guarding.  Musculoskeletal: Normal range of motion. No edema and no tenderness.  Neuro: Alert. CN 2-12 grossly intact. No focal deficits. Skin: Skin is warm and dry. No rash noted. Psychiatric: Normal mood and affect.      LABORATORY PANEL:   CBC  Recent Labs Lab 07/09/16 0411  WBC 7.7  HGB 11.9*  HCT 33.7*  PLT 185   ------------------------------------------------------------------------------------------------------------------  Chemistries   Recent Labs Lab 07/07/16 1714  07/09/16 0411  07/09/16 1349  NA 130*  < > 136  --   --   K 2.4*  < > 2.7*  < > 3.8  CL 86*  < > 101  --   --   CO2 33*  < > 28  --   --   GLUCOSE 124*  < > 94  --   --   BUN 17  < > <5*  --   --   CREATININE 0.71  < > 0.72  --   --   CALCIUM 8.5*  < > 7.3*  --   --   MG  --   < > 1.7  --   --   AST 34  --   --   --   --   ALT 18  --   --   --   --   ALKPHOS 78  --   --   --   --   BILITOT 0.8  --   --   --   --   < > = values in this interval not displayed. ------------------------------------------------------------------------------------------------------------------  Cardiac Enzymes No results for input(s): TROPONINI in the last 168 hours. ------------------------------------------------------------------------------------------------------------------  RADIOLOGY:  Ct Abdomen  Pelvis W Contrast  Result Date: 07/07/2016 CLINICAL DATA:  C/O LLQ abdomen pain, diarrhea and hematuria x 2 weeks. ^141mL ISOVUE-300 IOPAMIDOL (ISOVUE-300) INJECTION 61% EXAM: CT ABDOMEN AND PELVIS WITH CONTRAST TECHNIQUE: Multidetector CT imaging of the abdomen and pelvis was performed using the standard protocol following bolus administration of intravenous contrast. CONTRAST:  ISOVUE-300 IOPAMIDOL (ISOVUE-300) INJECTION 61% COMPARISON:  08/20/2015 FINDINGS: Lower chest: There is minimal atelectasis at both lung bases. Heart size is normal.  Hepatobiliary: No focal liver abnormality is seen. No gallstones, gallbladder wall thickening, or biliary dilatation. Pancreas: Unremarkable. No pancreatic ductal dilatation or surrounding inflammatory changes. Spleen: Normal in size without focal abnormality. Adrenals/Urinary Tract: The adrenal glands are normal. No hydronephrosis or ureteral obstruction. No renal mass. Stomach/Bowel: The stomach and small bowel loops are normal in appearance. The appendix is well seen and has a normal appearance. There is diffuse, marked thickening of the sigmoid colon, associated with scattered diverticula. Fluid tracks along the lower transverse mesocolon superior to the dome of the bladder, compressing the left lateral aspect of the bladder. There is stranding along the fat plane between the colon and the bladder and the findings are consistent consistent with micro perforation. No free intraperitoneal air or abscess. Despite sigmoid colon thickening, there is no obstruction to the contrast material. Vascular/Lymphatic: There is atherosclerotic calcification of the abdominal aorta. No aneurysm. There is contrast opacification of the celiac axis, SMA, and IMA. Reproductive: Status post hysterectomy.  No adnexal mass. Other: No free pelvic fluid. Musculoskeletal: No acute or significant osseous findings. IMPRESSION: 1. Inflamed sigmoid colon, likely related to colonic diverticulitis. 2. Micro perforation of the mid sigmoid colon. 3. Inflammation involves the left lateral aspect of the urinary bladder and the dome of the bladder, possibly accounting for the patient's hematuria. 4. No free intraperitoneal air or abscess. 5. Status post hysterectomy. 6.  Aortic atherosclerosis. Electronically Signed   By: Norva Pavlov M.D.   On: 07/07/2016 22:00     ASSESSMENT AND PLAN:   66 year old female with a history of diverticulitis and COPD who presents with diarrhea and intermittent abdominal pain.  1. Acute diverticulitis with  microperforation: Appreciate surgical evaluation Continue soft diet, change antibiotic to Zosyn, get ID to see patient in consultation, discussed with Dr. Tonita Cong  2. Salmonella gastroenteritis, continue Zosyn, follow clinically   3. Hypokalemia: Replete intravenously and orally, changing IV fluids to add the potassium intravenously, magnesium was also supplemented intravenously, as level was 1.7. Again, repeat levels in the morning , Suspected due to gastrointestinal loss  4.hypocalcemia: Stable, awaiting for ionized  ALB 3.1  5. Tobacco dependence: Patient is encouraged to quit smoking. Counseling was provided for 4 minutes.   6. History of COPD without exacerbation: Continue inhalers and nebulizer treatments  7. History of anxiety and depression: Continue Remeron  8. UTI with hematuria: Needs to be evaluated for colovesicular fistula by urologist as outpatient.    Management plans discussed with the patient and she is in agreement.  CODE STATUS: full  TOTAL TIME TAKING CARE OF THIS PATIENT: 35 minutes.   D/w dr Tonita Cong  POSSIBLE D/C 1-2 days, DEPENDING ON CLINICAL CONDITION.   Katharina Caper M.D on 07/09/2016 at 3:47 PM  Between 7am to 6pm - Pager - [phone] After 6pm go to [email or web address] - password [email or web address]  [facility]  Office  [phone]  CC: Primary care physician; McLean-Scocuzza, Pasty Spillers, MD  Note: This dictation was prepared with Dragon dictation along with smaller phrase technology. Any transcriptional  errors that result from this process are unintentional.

## 2016-07-09 NOTE — Telephone Encounter (Signed)
done

## 2016-07-10 DIAGNOSIS — F329 Major depressive disorder, single episode, unspecified: Secondary | ICD-10-CM

## 2016-07-10 DIAGNOSIS — E876 Hypokalemia: Secondary | ICD-10-CM

## 2016-07-10 DIAGNOSIS — F17200 Nicotine dependence, unspecified, uncomplicated: Secondary | ICD-10-CM

## 2016-07-10 DIAGNOSIS — A020 Salmonella enteritis: Secondary | ICD-10-CM

## 2016-07-10 DIAGNOSIS — F419 Anxiety disorder, unspecified: Secondary | ICD-10-CM

## 2016-07-10 DIAGNOSIS — E162 Hypoglycemia, unspecified: Secondary | ICD-10-CM

## 2016-07-10 DIAGNOSIS — J449 Chronic obstructive pulmonary disease, unspecified: Secondary | ICD-10-CM

## 2016-07-10 LAB — BASIC METABOLIC PANEL
Anion gap: 5 (ref 5–15)
CALCIUM: 7.3 mg/dL — AB (ref 8.9–10.3)
CHLORIDE: 105 mmol/L (ref 101–111)
CO2: 28 mmol/L (ref 22–32)
CREATININE: 0.69 mg/dL (ref 0.44–1.00)
GFR calc non Af Amer: >60 mL/min (ref >60)
Glucose, Bld: 99 mg/dL (ref 65–99)
Potassium: 4.4 mmol/L (ref 3.5–5.1)
SODIUM: 138 mmol/L (ref 135–145)

## 2016-07-10 LAB — HEMOGLOBIN: HEMOGLOBIN: 13.1 g/dL (ref 12.0–16.0)

## 2016-07-10 LAB — MAGNESIUM: Magnesium: 2 mg/dL (ref 1.7–2.4)

## 2016-07-10 MED ORDER — METRONIDAZOLE 500 MG PO TABS: 500.0000 mg | ORAL_TABLET | Freq: Three times a day (TID) | ORAL | 0 refills | Status: DC

## 2016-07-10 MED ORDER — CIPROFLOXACIN HCL 500 MG PO TABS: 500.0000 mg | ORAL_TABLET | Freq: Two times a day (BID) | ORAL | 0 refills | Status: DC

## 2016-07-10 MED ORDER — OXYCODONE HCL 5 MG PO TABS: 5.0000 mg | ORAL_TABLET | ORAL | 0 refills | Status: DC | PRN

## 2016-07-10 NOTE — Discharge Summary (Signed)
[facility] [place] at [facility]   PATIENT NAME: Yvette Martinez    MR#:  [id number]  DATE OF BIRTH:  03-05-1950  DATE OF ADMISSION:  07/07/2016 ADMITTING PHYSICIAN: Jon Gills Hugelmeyer, DO  DATE OF DISCHARGE: 07/10/2016  1:15 PM  PRIMARY CARE PHYSICIAN: No primary care provider on file.     ADMISSION DIAGNOSIS:  Hypokalemia [E87.6] Diverticulitis [K57.92] Diarrhea, unspecified type [R19.7]  DISCHARGE DIAGNOSIS:  Active Problems:   Diverticulitis   Salmonella gastroenteritis   Hypokalemia   Hypoglycemia   Tobacco dependence   COPD (chronic obstructive pulmonary disease) (HCC)   Anxiety and depression   SECONDARY DIAGNOSIS:   Past Medical History:  Diagnosis Date  . COPD (chronic obstructive pulmonary disease) (HCC)   . Diverticula of colon     .pro HOSPITAL COURSE:   The patient is a 66 year old female with past medical history significant for history for COPD, diverticulosis, depression, who presented to the hospital with complaints of diarrhea, decreased appetite. Patient complained of ongoing diarrhea for the past 2 weeks, left lower quadrant abdominal pain, which was described as cramping. She admitted of hematuria and dysuria. CT scan in emergency room revealed sigmoid colon inflammation, suspected diverticular, microperforation and mid sigmoid colon, no abscess, inflammation in the left Lateral aspect of the bladder and the dome of the bladder. The patient was initiated on broad-spectrum antibiotic therapy and admitted to the hospital, surgical consultation was obtained, who recommended workup of patient's hematuria by urologist including evaluation for colovesical fistula. Patient was managed on Zosyn initially, which was changed to ciprofloxacin and Flagyl. Stool cultures revealed Salmonella species. Infectious disease specialist, Dr. Sampson Goon saw patient in consultation and recommended to continue antibiotic therapy and follow-up with  primary care physician, surgeon as outpatient for further recommendations, questionable elective surgery for diverticulitis when she is stable clinically. Discussion by problem: 1. Acute diverticulitis with microperforation: Appreciate surgical and infectious disease specialist inputs Continue soft diet, ciprofloxacin and Flagyl for 7 more days, follow-up with Dr. Tonita Cong as outpatient. Dr. Sampson Goon recommends to consider elective colectomy for diverticulitis when clinically stable. .   3. Hypokalemia: Repleted intravenously and orally, magnesium level was supplemented intravenously yesterday, stabilized at 2.0 at present. The diarrhea has subsided, suspect lower losses   4.hypocalcemia: Stable,due to hypoalbuminemia/malnourishment, patient was advised to continue nutritional supplements with boost/ensure, etc.   5. Tobacco dependence: Patient is encouraged to quit smoking. Counseling was provided for 4 minutes by prior rounding physician .   6. History of COPD without exacerbation: Continue inhalers and nebulizer treatments  7. History of anxiety and depression: Continue Remeron  8. UTI with hematuria: Needs to be evaluated for colovesicular fistula by urologist as outpatient.   DISCHARGE CONDITIONS:   Stable   CONSULTS OBTAINED:  Treatment Team:  Leafy Ro, MD Hildred Laser, MD Mick Sell, MD  DRUG ALLERGIES:   Allergies  Allergen Reactions  . Sulfa Antibiotics Hives    DISCHARGE MEDICATIONS:   Discharge Medication List as of 07/10/2016 11:09 AM    START taking these medications   Details  ciprofloxacin (CIPRO) 500 MG tablet Take 1 tablet (500 mg total) by mouth 2 (two) times daily., Starting Sat 07/10/2016, Normal    oxyCODONE (OXY IR/ROXICODONE) 5 MG immediate release tablet Take 1 tablet (5 mg total) by mouth every 4 (four) hours as needed for moderate pain., Starting Sat 07/10/2016, Print      CONTINUE these medications which have CHANGED    Details  metroNIDAZOLE (  FLAGYL) 500 MG tablet Take 1 tablet (500 mg total) by mouth 3 (three) times daily., Starting Sat 07/10/2016, Normal      CONTINUE these medications which have NOT CHANGED   Details  ALPRAZolam (XANAX) 1 MG tablet Take 1 tablet by mouth every 6 (six) hours as needed for anxiety. , Until Discontinued, Historical Med    antiseptic oral rinse (CPC / CETYLPYRIDINIUM CHLORIDE 0.05%) 0.05 % LIQD solution 7 mLs by Mouth Rinse route 2 (two) times daily., Starting 08/23/2015, Until Discontinued, Normal    Fluticasone-Salmeterol (ADVAIR DISKUS) 100-50 MCG/DOSE AEPB Inhale 1 puff into the lungs 2 (two) times daily., Starting 06/28/2014, Until Discontinued, Historical Med    menthol-cetylpyridinium (CEPACOL) 3 MG lozenge Take 1 lozenge by mouth as needed for sore throat., Historical Med    mirtazapine (REMERON) 30 MG tablet Take 30 mg by mouth 2 (two) times daily. , Until Discontinued, Historical Med    tiotropium (SPIRIVA) 18 MCG inhalation capsule Place 1 capsule into inhaler and inhale daily., Starting 06/28/2014, Until Discontinued, Historical Med    VENTOLIN HFA 108 (90 Base) MCG/ACT inhaler Inhale 1-2 puffs into the lungs every 6 (six) hours as needed. , Starting 03/26/2015, Until Discontinued, Historical Med    albuterol (PROVENTIL) (2.5 MG/3ML) 0.083% nebulizer solution Take 3 mLs (2.5 mg total) by nebulization every 4 (four) hours as needed for wheezing or shortness of breath., Starting 08/23/2015, Until Discontinued, Normal    bisacodyl (DULCOLAX) 10 MG suppository Place 1 suppository (10 mg total) rectally as needed for moderate constipation., Starting 04/17/2015, Until Discontinued, Print    nicotine (NICODERM CQ - DOSED IN MG/24 HOURS) 21 mg/24hr patch Place 1 patch (21 mg total) onto the skin daily., Starting 08/23/2015, Until Discontinued, Normal      STOP taking these medications     levofloxacin (LEVAQUIN) 750 MG tablet      predniSONE (STERAPRED UNI-PAK 21 TAB)  10 MG (21) TBPK tablet        The patient is to follow-up with primary care physician, surgeon as outpatient   The patient is to foThe patient is to follow-up with primary care physician, surgeon as outpatient worsening of your admission symptoms, develop shortness of breath, life threatening emergency, suicidal or homicidal thoughts you must seek medical attention immediately by calling 911 or calling your MD immediately  if symptoms less severe.  You Must read complete instructions/literature along with all the possible adverse reactions/side effects for all the Medicines you take and that have been prescribed to you. Take any new Medicines after you have completely understood and accept all the possible adverse reactions/side effects.   Please note  You were cared for by a hospitalist during your hospital stay. If you have any questions about your discharge medications or the care you received while you were in the hospital after you are discharged, you can call the unit and asked to speak with the hospitalist on call if the hospitalist that took care of you is not available. Once you are discharged, your primary care physician will handle any further medical issues. Please note that NO REFILLS for any discharge medications will be authorized once you are discharged, as it is imperative that you return to your primary care physician (or establish a relationship with a primary care physician if you do not have one) for your aftercare needs so that they can reassess your need for medications and monitor your lab values.    Today   CHIEF COMPLAINT:   Chief Complaint  Patient presents with  . Diarrhea  . Hematuria    HISTORY OF PRESENT ILLNESS:    VITAL SIGNS:  Blood pressure 126/66, pulse 89, temperature 98.1 F (36.7 C), temperature source Oral, resp. rate 18, height 5\' 2"  (1.575 m), weight 60.3 kg (133 lb), SpO2 95 %.  I/O:   Intake/Output Summary (Last 24 hours) at 07/10/16  1444 Last data filed at 07/10/16 0931  Gross per 24 hour  Intake          2604.49 ml  Output                0 ml  Net          2604.49 ml    PHYSICAL EXAMINATION:  GENERAL:  66 y.o.-year-old patient lying in the bed with no acute distress.  EYES: Pupils equal, round, reactive to light and accommodation. No scleral icterus. Extraocular muscles intact.  HEENT: Head atraumatic, normocephalic. Oropharynx and nasopharynx clear.  NECK:  Supple, no jugular venous distention. No thyroid enlargement, no tenderness.  LUNGS: Normal breath sounds bilaterally, no wheezing, rales,rhonchi or crepitation. No use of accessory muscles of respiration.  CARDIOVASCULAR: S1, S2 normal. No murmurs, rubs, or gallops.  ABDOMEN: Sof, Mild discomfort in left lower quadrant but no rebound or guarding, not distended . Bowel sounds present. No organomegaly or mass.  EXTREMITIES: No pedal edema, cyanosis, or clubbing.  NEUROLOGIC: Cranial nerves II through XII are intact. Muscle strength 5/5 in all extremities. Sensation intact. Gait not checked.  PSYCHIATRIC: The patient is alert and oriented x 3.  SKIN: No obvious rash, lesion, or ulcer.   DATA REVIEW:   CBC  Recent Labs Lab 07/09/16 0411 07/10/16 0355  WBC 7.7  --   HGB 11.9* 13.1  HCT 33.7*  --   PLT 185  --     Chemistries   Recent Labs Lab 07/07/16 1714  07/10/16 0355  NA 130*  < > 138  K 2.4*  < > 4.4  CL 86*  < > 105  CO2 33*  < > 28  GLUCOSE 124*  < > 99  BUN 17  < > <5*  CREATININE 0.71  < > 0.69  CALCIUM 8.5*  < > 7.3*  MG  --   < > 2.0  AST 34  --   --   ALT 18  --   --   ALKPHOS 78  --   --   BILITOT 0.8  --   --   < > = values in this interval not displayed.  Cardiac Enzymes No results for input(s): TROPONINI in the last 168 hours.  Microbiology Results  Results for orders placed or performed during the hospital encounter of 07/07/16  C difficile quick scan w PCR reflex     Status: None   Collection Time: 07/08/16 12:54  AM  Result Value Ref Range Status   C Diff antigen NEGATIVE NEGATIVE Final   C Diff toxin NEGATIVE NEGATIVE Final   C Diff interpretation No C. difficile detected.  Final  Gastrointestinal Panel by PCR , Stool     Status: Abnormal   Collection Time: 07/08/16 12:54 AM  Result Value Ref Range Status   Campylobacter species NOT DETECTED NOT DETECTED Final   Plesimonas shigelloides NOT DETECTED NOT DETECTED Final   Salmonella species DETECTED (A) NOT DETECTED Final    Comment: CRITICAL RESULT CALLED TO, READ BACK BY AND VERIFIED WITH: KAREN SMITH AT 0744 ON 07/08/16 [facility].    Yersinia enterocolitica  NOT DETECTED NOT DETECTED Final   Vibrio species NOT DETECTED NOT DETECTED Final   Vibrio cholerae NOT DETECTED NOT DETECTED Final   Enteroaggregative E coli (EAEC) NOT DETECTED NOT DETECTED Final   Enteropathogenic E coli (EPEC) NOT DETECTED NOT DETECTED Final   Enterotoxigenic E coli (ETEC) NOT DETECTED NOT DETECTED Final   Shiga like toxin producing E coli (STEC) NOT DETECTED NOT DETECTED Final   Shigella/Enteroinvasive E coli (EIEC) NOT DETECTED NOT DETECTED Final   Cryptosporidium NOT DETECTED NOT DETECTED Final   Cyclospora cayetanensis NOT DETECTED NOT DETECTED Final   Entamoeba histolytica NOT DETECTED NOT DETECTED Final   Giardia lamblia NOT DETECTED NOT DETECTED Final   Adenovirus F40/41 NOT DETECTED NOT DETECTED Final   Astrovirus NOT DETECTED NOT DETECTED Final   Norovirus GI/GII NOT DETECTED NOT DETECTED Final   Rotavirus A NOT DETECTED NOT DETECTED Final   Sapovirus (I, II, IV, and V) NOT DETECTED NOT DETECTED Final    RADIOLOGY:  No results found.  EKG:   Orders placed or performed during the hospital encounter of 07/07/16  . EKG 12-Lead      Management plans discussed with the patient, family and they are in agreement.  CODE STATUS:     Code Status Orders        Start     Ordered   07/08/16 0054  Full code  Continuous     07/08/16 0053    Code Status  History    Date Active Date Inactive Code Status Order ID Comments User Context   08/20/2015  1:41 AM 08/20/2015  1:41 AM Full Code [id number]  Joella Prince, MD ED   08/20/2015  1:41 AM 08/23/2015  4:30 PM Full Code [id number]  Joella Prince, MD ED      TOTAL TIME TAKING CARE OF THIS PATIENT: 40  minutes.    Katharina Caper M.D on 07/10/2016 at 2:44 PM  Between 7am to 6pm - Pager - [phone]  After 6pm go to [email or web address] - password [email or web address] [facility]  [facility]  Office  [phone]  CC: Primary care physician; No primary care provider on file.

## 2016-07-10 NOTE — Progress Notes (Signed)
Pt d/c home; d/c instructions reviewed w/ pt; pt understanding was verbalized; IV removed, catheter in tact, gauze dressing applied; all pt questions answered; pt verbalized that all pt belongings were accounted for; pt left unit via wheelchair accompanied by staff 

## 2016-07-10 NOTE — Discharge Instructions (Signed)
Follow all MD discharge instructions. Take all medications as prescribed. Keep all follow up appointments. If your symptoms return, call your doctor. If you experience any new symptoms that are of concern to you or that are bothersome to you, call your doctor. For all questions and/or concerns, call your doctor. ° ° If you have a medical emergency, call 911 ° ° ° °

## 2016-07-10 NOTE — Progress Notes (Signed)
MEDICATION RELATED CONSULT NOTE -FOLLOW UP   Pharmacy Consult for Electrolyte management  Indication: hypokalemia      Allergies  Allergen Reactions  . Sulfa Antibiotics Hives    Patient Measurements: Height: 5\' 2"  (157.5 cm) Weight: 133 lb (60.3 kg) IBW/kg (Calculated) : 50.1 Adjusted Body Weight:   Vital Signs: Temp: 98.3 F (36.8 C) (05/11 0500) Temp Source: Oral (05/11 0500) BP: 109/49 (05/11 0500) Pulse Rate: 88 (05/11 0500) Intake/Output from previous day: 05/10 0701 - 05/11 0700 In: 967 [P.O.:240; I.V.:427; IV Piggyback:300] Out: 1300 [Urine:1300] Intake/Output from this shift: No intake/output data recorded.  Labs:  Recent Labs (last 2 labs)    Recent Labs  07/07/16 1714 07/08/16 0739 07/09/16 0411  WBC 9.8  --  7.7  HGB 13.8  --  11.9*  HCT 38.6  --  33.7*  PLT 232  --  185  CREATININE 0.71 0.67 0.72  MG  --  2.0 1.7  ALBUMIN 3.1*  --   --   PROT 6.8  --   --   AST 34  --   --   ALT 18  --   --   ALKPHOS 78  --   --   BILITOT 0.8  --   --      Estimated Creatinine Clearance: 58.4 mL/min (by C-G formula based on SCr of 0.72 mg/dL).   Microbiology:        Recent Results (from the past 720 hour(s))  C difficile quick scan w PCR reflex     Status: None   Collection Time: 07/08/16 12:54 AM  Result Value Ref Range Status   C Diff antigen NEGATIVE NEGATIVE Final   C Diff toxin NEGATIVE NEGATIVE Final   C Diff interpretation No C. difficile detected.  Final  Gastrointestinal Panel by PCR , Stool     Status: Abnormal   Collection Time: 07/08/16 12:54 AM  Result Value Ref Range Status   Campylobacter species NOT DETECTED NOT DETECTED Final   Plesimonas shigelloides NOT DETECTED NOT DETECTED Final   Salmonella species DETECTED (A) NOT DETECTED Final    Comment: CRITICAL RESULT CALLED TO, READ BACK BY AND VERIFIED WITH: KAREN SMITH AT 0744 ON 07/08/16 [facility].    Yersinia enterocolitica NOT DETECTED NOT DETECTED Final    Vibrio species NOT DETECTED NOT DETECTED Final   Vibrio cholerae NOT DETECTED NOT DETECTED Final   Enteroaggregative E coli (EAEC) NOT DETECTED NOT DETECTED Final   Enteropathogenic E coli (EPEC) NOT DETECTED NOT DETECTED Final   Enterotoxigenic E coli (ETEC) NOT DETECTED NOT DETECTED Final   Shiga like toxin producing E coli (STEC) NOT DETECTED NOT DETECTED Final   Shigella/Enteroinvasive E coli (EIEC) NOT DETECTED NOT DETECTED Final   Cryptosporidium NOT DETECTED NOT DETECTED Final   Cyclospora cayetanensis NOT DETECTED NOT DETECTED Final   Entamoeba histolytica NOT DETECTED NOT DETECTED Final   Giardia lamblia NOT DETECTED NOT DETECTED Final   Adenovirus F40/41 NOT DETECTED NOT DETECTED Final   Astrovirus NOT DETECTED NOT DETECTED Final   Norovirus GI/GII NOT DETECTED NOT DETECTED Final   Rotavirus A NOT DETECTED NOT DETECTED Final   Sapovirus (I, II, IV, and V) NOT DETECTED NOT DETECTED Final    Medical History:     Past Medical History:  Diagnosis Date  . COPD (chronic obstructive pulmonary disease) (HCC)   . Diverticula of colon     Medications:         Prescriptions Prior to Admission  Medication Sig Dispense  Refill Last Dose  . ALPRAZolam (XANAX) 1 MG tablet Take 1 tablet by mouth every 6 (six) hours as needed for anxiety.    07/07/2016 at 1400  . antiseptic oral rinse (CPC / CETYLPYRIDINIUM CHLORIDE 0.05%) 0.05 % LIQD solution 7 mLs by Mouth Rinse route 2 (two) times daily. 118 mL 0 prn at prn  . Fluticasone-Salmeterol (ADVAIR DISKUS) 100-50 MCG/DOSE AEPB Inhale 1 puff into the lungs 2 (two) times daily.   07/07/2016 at am  . menthol-cetylpyridinium (CEPACOL) 3 MG lozenge Take 1 lozenge by mouth as needed for sore throat.   prn at prn  . mirtazapine (REMERON) 30 MG tablet Take 30 mg by mouth 2 (two) times daily.    07/06/2016 at pm  . tiotropium (SPIRIVA) 18 MCG inhalation capsule Place 1 capsule into inhaler and inhale daily.   07/07/2016 at Unknown  time  . VENTOLIN HFA 108 (90 Base) MCG/ACT inhaler Inhale 1-2 puffs into the lungs every 6 (six) hours as needed.    prn at prn  . albuterol (PROVENTIL) (2.5 MG/3ML) 0.083% nebulizer solution Take 3 mLs (2.5 mg total) by nebulization every 4 (four) hours as needed for wheezing or shortness of breath. (Patient not taking: Reported on 07/07/2016) 75 mL 12 Not Taking at Unknown time  . bisacodyl (DULCOLAX) 10 MG suppository Place 1 suppository (10 mg total) rectally as needed for moderate constipation. (Patient not taking: Reported on 08/19/2015) 12 suppository 0 Not Taking at Unknown time  . levofloxacin (LEVAQUIN) 750 MG tablet Take 1 tablet (750 mg total) by mouth daily. (Patient not taking: Reported on 07/07/2016) 5 tablet 0 Completed Course at Unknown time  . metroNIDAZOLE (FLAGYL) 500 MG tablet Take 1 tablet (500 mg total) by mouth every 8 (eight) hours. (Patient not taking: Reported on 07/07/2016) 15 tablet 0 Completed Course at Unknown time  . nicotine (NICODERM CQ - DOSED IN MG/24 HOURS) 21 mg/24hr patch Place 1 patch (21 mg total) onto the skin daily. (Patient not taking: Reported on 07/07/2016) 28 patch 0 Not Taking at Unknown time  . predniSONE (STERAPRED UNI-PAK 21 TAB) 10 MG (21) TBPK tablet Take 1 tablet (10 mg total) by mouth daily. Please take 6 pills in the morning with breakfast on the day 1, then taper by one pill daily until finished. Thank you. (Patient not taking: Reported on 07/07/2016) 21 tablet 0 Completed Course at Unknown time   Scheduled:  . antiseptic oral rinse  7 mL Mouth Rinse BID  . ciprofloxacin  500 mg Oral BID  . enoxaparin (LOVENOX) injection  40 mg Subcutaneous Q24H  . mirtazapine  30 mg Oral BID  . mometasone-formoterol  2 puff Inhalation BID  . pneumococcal 23 valent vaccine  0.5 mL Intramuscular Tomorrow-1000  . potassium chloride  40 mEq Oral Q4H  . tiotropium  1 capsule Inhalation Daily    Assessment: Pharmacy consulted to manage electrolytes in this 66 year  old woman   Goal of Therapy:  K= 3.5  Plan:   5/11: K= 2.7. Patient received KCl 10 mEq IV x 2 doses. Patient also received 40 mEq Po x 1 @ 06:00. Will give an additional 20 mEq of PO K and will recheck K @ 11:30.   5/11: K @ ~12:00 = 3.3. Patient also receiving KCl 20 mEq in IVF. Will recheck electrolytes with am labs.   5/12 @ 0400 K+ 4.4. K+ in range no further supplementation needed.  Thank you for this consult.  Thomasene Ripple, PharmD, BCPS Clinical Pharmacist  07/10/2016   

## 2016-08-06 ENCOUNTER — Encounter: Payer: Medicare HMO | Admitting: Urology

## 2016-08-06 ENCOUNTER — Encounter: Payer: Self-pay | Admitting: Urology

## 2016-08-06 VITALS — BP 100/68 | HR 94 | Ht 60.0 in | Wt 130.4 lb

## 2016-08-06 DIAGNOSIS — R310 Gross hematuria: Secondary | ICD-10-CM

## 2016-08-06 LAB — URINALYSIS, COMPLETE
Bilirubin, UA: NEGATIVE
GLUCOSE, UA: NEGATIVE
Ketones, UA: NEGATIVE
Nitrite, UA: NEGATIVE
RBC, UA: NEGATIVE
Specific Gravity, UA: >1.03 — ABNORMAL HIGH (ref 1.005–1.030)
UUROB: 0.2 mg/dL (ref 0.2–1.0)
pH, UA: 5.5 (ref 5.0–7.5)

## 2016-08-06 LAB — MICROSCOPIC EXAMINATION
Epithelial Cells (non renal): >10 /hpf — ABNORMAL HIGH (ref 0–10)
RBC, UA: NONE SEEN /hpf (ref 0–?)

## 2016-08-06 MED ORDER — CIPROFLOXACIN HCL 500 MG PO TABS: 500.0000 mg | ORAL_TABLET | Freq: Once | ORAL | Status: DC

## 2016-08-06 MED ORDER — LIDOCAINE HCL 2 % EX GEL: 1.0000 "application " | Freq: Once | CUTANEOUS | Status: DC

## 2016-08-06 NOTE — Progress Notes (Signed)
This encounter was created in error - please disregard.

## 2016-08-11 LAB — CULTURE, URINE COMPREHENSIVE

## 2016-08-26 ENCOUNTER — Encounter: Payer: Self-pay | Admitting: Urology

## 2016-08-26 ENCOUNTER — Ambulatory Visit (INDEPENDENT_AMBULATORY_CARE_PROVIDER_SITE_OTHER): Payer: Medicare HMO | Admitting: Urology

## 2016-08-26 VITALS — BP 105/66 | HR 88 | Ht 60.0 in | Wt 130.1 lb

## 2016-08-26 DIAGNOSIS — R310 Gross hematuria: Secondary | ICD-10-CM

## 2016-08-26 LAB — URINALYSIS, COMPLETE
Bilirubin, UA: NEGATIVE
Glucose, UA: NEGATIVE
KETONES UA: NEGATIVE
Leukocytes, UA: NEGATIVE
NITRITE UA: NEGATIVE
RBC UA: NEGATIVE
SPEC GRAV UA: 1.025 (ref 1.005–1.030)
Urobilinogen, Ur: 0.2 mg/dL (ref 0.2–1.0)
pH, UA: 5 (ref 5.0–7.5)

## 2016-08-26 LAB — MICROSCOPIC EXAMINATION
BACTERIA UA: NONE SEEN
RBC, UA: NONE SEEN /hpf (ref 0–?)
WBC, UA: NONE SEEN /hpf (ref 0–?)

## 2016-08-26 MED ORDER — CIPROFLOXACIN HCL 500 MG PO TABS
500.0000 mg | ORAL_TABLET | Freq: Once | ORAL | Status: AC
  Administered 2016-08-26: 500 mg via ORAL

## 2016-08-26 MED ORDER — LIDOCAINE HCL 2 % EX GEL
1.0000 "application " | Freq: Once | CUTANEOUS | Status: AC
  Administered 2016-08-26: 1 via URETHRAL

## 2016-08-26 NOTE — Progress Notes (Signed)
   08/26/16  CC:  Chief Complaint  Patient presents with  . Cysto    HPI: The patient is a 66 year old female with no past GU history who recently presented to the hospital with diarrhea. Urology was consulted for gross hematuria. She had been having gross painless hematuria for the 2 weeks prior. She is no history of nephrolithiasis but hasn't proven or recurrent urinary tract infection. She is never seen blood in her urine prior to this. She was eventually admitted to the hospital with inflamed sigmoid colon likely from colonic diverticulitis and a microperforation of her mid sigmoid colon. There was inflammation involving the lateral aspect of the urinary bladder near the dome however there is no intraluminal air in the bladder. At this point, the patient is feeling much better. She is voiding without difficulty. Her hematuria is resolved. Her diarrhea has resolved. She is tolerating diet.  Blood pressure 105/66, pulse 88, height 5' (1.524 m), weight 130 lb 1.6 oz (59 kg). NED. A&Ox3.   No respiratory distress   Abd soft, NT, ND Normal external genitalia with patent urethral meatus  Cystoscopy Procedure Note  Patient identification was confirmed, informed consent was obtained, and patient was prepped using Betadine solution.  Lidocaine jelly was administered per urethral meatus.    Preoperative abx where received prior to procedure.    Procedure: - Flexible cystoscope introduced, without any difficulty.   - Thorough search of the bladder revealed:    normal urethral meatus    normal urothelium    no stones    no ulcers     no tumors    no urethral polyps    no trabeculation  - Ureteral orifices were normal in position and appearance.  Post-Procedure: - Patient tolerated the procedure well  Assessment/ Plan:  1. Gross hematuria Patient had a negative gross hematuria workup. I suspect her hematuria was from the inflammation that was seen on CT scan from diverticulitis  causing local inflammation of her bladder. At this time, as this has resolved, I think she can follow up with us as needed.  Hildred LaserBrian James Ayaan Shutes, MD

## 2017-02-21 ENCOUNTER — Emergency Department: Payer: Medicare HMO

## 2017-02-21 ENCOUNTER — Inpatient Hospital Stay
Admission: EM | Admit: 2017-02-21 | Discharge: 2017-02-23 | DRG: 193 | Disposition: A | Discharge status: Home / Self Care | Payer: Medicare HMO | Attending: Internal Medicine | Admitting: Internal Medicine

## 2017-02-21 DIAGNOSIS — F17200 Nicotine dependence, unspecified, uncomplicated: Secondary | ICD-10-CM | POA: Diagnosis not present

## 2017-02-21 DIAGNOSIS — Z888 Allergy status to other drugs, medicaments and biological substances status: Secondary | ICD-10-CM | POA: Diagnosis not present

## 2017-02-21 DIAGNOSIS — J441 Chronic obstructive pulmonary disease with (acute) exacerbation: Secondary | ICD-10-CM | POA: Diagnosis present

## 2017-02-21 DIAGNOSIS — Z9981 Dependence on supplemental oxygen: Secondary | ICD-10-CM | POA: Diagnosis not present

## 2017-02-21 DIAGNOSIS — I248 Other forms of acute ischemic heart disease: Secondary | ICD-10-CM | POA: Diagnosis present

## 2017-02-21 DIAGNOSIS — Z880 Allergy status to penicillin: Secondary | ICD-10-CM

## 2017-02-21 DIAGNOSIS — F17210 Nicotine dependence, cigarettes, uncomplicated: Secondary | ICD-10-CM | POA: Diagnosis present

## 2017-02-21 DIAGNOSIS — Z7951 Long term (current) use of inhaled steroids: Secondary | ICD-10-CM

## 2017-02-21 DIAGNOSIS — E119 Type 2 diabetes mellitus without complications: Secondary | ICD-10-CM | POA: Diagnosis present

## 2017-02-21 DIAGNOSIS — J9600 Acute respiratory failure, unspecified whether with hypoxia or hypercapnia: Secondary | ICD-10-CM

## 2017-02-21 DIAGNOSIS — Z86718 Personal history of other venous thrombosis and embolism: Secondary | ICD-10-CM | POA: Diagnosis not present

## 2017-02-21 DIAGNOSIS — E876 Hypokalemia: Secondary | ICD-10-CM | POA: Diagnosis present

## 2017-02-21 DIAGNOSIS — Z9119 Patient's noncompliance with other medical treatment and regimen: Secondary | ICD-10-CM

## 2017-02-21 DIAGNOSIS — Z7901 Long term (current) use of anticoagulants: Secondary | ICD-10-CM

## 2017-02-21 DIAGNOSIS — J189 Pneumonia, unspecified organism: Secondary | ICD-10-CM

## 2017-02-21 DIAGNOSIS — J440 Chronic obstructive pulmonary disease with acute lower respiratory infection: Secondary | ICD-10-CM | POA: Diagnosis present

## 2017-02-21 DIAGNOSIS — Z882 Allergy status to sulfonamides status: Secondary | ICD-10-CM | POA: Diagnosis not present

## 2017-02-21 DIAGNOSIS — J9601 Acute respiratory failure with hypoxia: Secondary | ICD-10-CM | POA: Diagnosis present

## 2017-02-21 DIAGNOSIS — R918 Other nonspecific abnormal finding of lung field: Secondary | ICD-10-CM | POA: Diagnosis not present

## 2017-02-21 HISTORY — DX: Pneumonia, unspecified organism: J18.9

## 2017-02-21 HISTORY — DX: Type 2 diabetes mellitus without complications: E11.9

## 2017-02-21 HISTORY — DX: Deep phlebothrombosis in pregnancy, unspecified trimester: O22.30

## 2017-02-21 LAB — CBC WITH DIFFERENTIAL/PLATELET
BASOS PCT: 0 %
Basophils Absolute: 0 10*3/uL (ref 0–0.1)
EOS ABS: 0 10*3/uL (ref 0–0.7)
Eosinophils Relative: 0 %
HCT: 45.1 % (ref 35.0–47.0)
HEMOGLOBIN: 15.3 g/dL (ref 12.0–16.0)
LYMPHS ABS: 0.5 10*3/uL — AB (ref 1.0–3.6)
Lymphocytes Relative: 7 %
MCH: 33.1 pg (ref 26.0–34.0)
MCHC: 34 g/dL (ref 32.0–36.0)
MCV: 97.4 fL (ref 80.0–100.0)
Monocytes Absolute: 0.5 10*3/uL (ref 0.2–0.9)
Monocytes Relative: 8 %
NEUTROS PCT: 85 %
Neutro Abs: 5.4 10*3/uL (ref 1.4–6.5)
Platelets: 198 10*3/uL (ref 150–440)
RBC: 4.63 MIL/uL (ref 3.80–5.20)
RDW: 13.4 % (ref 11.5–14.5)
WBC: 6.3 10*3/uL (ref 3.6–11.0)

## 2017-02-21 LAB — BASIC METABOLIC PANEL
Anion gap: 9 (ref 5–15)
BUN: 25 mg/dL — ABNORMAL HIGH (ref 6–20)
CHLORIDE: 100 mmol/L — AB (ref 101–111)
CO2: 27 mmol/L (ref 22–32)
Calcium: 8.9 mg/dL (ref 8.9–10.3)
Creatinine, Ser: 0.93 mg/dL (ref 0.44–1.00)
GFR calc non Af Amer: >60 mL/min (ref >60)
Glucose, Bld: 111 mg/dL — ABNORMAL HIGH (ref 65–99)
POTASSIUM: 3.3 mmol/L — AB (ref 3.5–5.1)
SODIUM: 136 mmol/L (ref 135–145)

## 2017-02-21 LAB — TROPONIN I
TROPONIN I: 0.05 ng/mL — AB (ref <0.03)
Troponin I: 0.04 ng/mL (ref <0.03)

## 2017-02-21 LAB — LACTIC ACID, PLASMA: LACTIC ACID, VENOUS: 1.9 mmol/L (ref 0.5–1.9)

## 2017-02-21 LAB — PROCALCITONIN: Procalcitonin: 3.59 ng/mL

## 2017-02-21 LAB — MAGNESIUM: MAGNESIUM: 2.3 mg/dL (ref 1.7–2.4)

## 2017-02-21 MED ORDER — ACETAMINOPHEN 325 MG PO TABS: 650.0000 mg | ORAL_TABLET | Freq: Four times a day (QID) | ORAL | Status: DC | PRN

## 2017-02-21 MED ORDER — BISACODYL 5 MG PO TBEC: 5.0000 mg | DELAYED_RELEASE_TABLET | Freq: Every day | ORAL | Status: DC | PRN

## 2017-02-21 MED ORDER — NICOTINE 21 MG/24HR TD PT24
21.0000 mg | MEDICATED_PATCH | Freq: Every day | TRANSDERMAL | Status: DC
  Administered 2017-02-22 – 2017-02-23 (×3): 21 mg via TRANSDERMAL
  Filled 2017-02-21 (×3): qty 1

## 2017-02-21 MED ORDER — IPRATROPIUM-ALBUTEROL 0.5-2.5 (3) MG/3ML IN SOLN
3.0000 mL | Freq: Once | RESPIRATORY_TRACT | Status: AC
  Administered 2017-02-21: 3 mL via RESPIRATORY_TRACT

## 2017-02-21 MED ORDER — LEVOFLOXACIN IN D5W 750 MG/150ML IV SOLN
750.0000 mg | INTRAVENOUS | Status: DC
  Administered 2017-02-22: 03:00:00 750 mg via INTRAVENOUS
  Filled 2017-02-21 (×2): qty 150

## 2017-02-21 MED ORDER — METHYLPREDNISOLONE SODIUM SUCC 125 MG IJ SOLR
125.0000 mg | Freq: Once | INTRAMUSCULAR | Status: AC
  Administered 2017-02-21: 125 mg via INTRAVENOUS
  Filled 2017-02-21: qty 2

## 2017-02-21 MED ORDER — ONDANSETRON HCL 4 MG PO TABS: 4.0000 mg | ORAL_TABLET | Freq: Four times a day (QID) | ORAL | Status: DC | PRN

## 2017-02-21 MED ORDER — MIRTAZAPINE 15 MG PO TABS
30.0000 mg | ORAL_TABLET | Freq: Two times a day (BID) | ORAL | Status: DC
  Administered 2017-02-22 – 2017-02-23 (×4): 30 mg via ORAL
  Filled 2017-02-21 (×4): qty 2

## 2017-02-21 MED ORDER — CEFTRIAXONE SODIUM IN DEXTROSE 20 MG/ML IV SOLN
1.0000 g | Freq: Once | INTRAVENOUS | Status: AC
  Administered 2017-02-21: 1 g via INTRAVENOUS
  Filled 2017-02-21: qty 50

## 2017-02-21 MED ORDER — SODIUM CHLORIDE 0.9 % IV SOLN
INTRAVENOUS | Status: DC
  Administered 2017-02-21: 23:00:00 via INTRAVENOUS

## 2017-02-21 MED ORDER — ACETAMINOPHEN 650 MG RE SUPP: 650.0000 mg | Freq: Four times a day (QID) | RECTAL | Status: DC | PRN

## 2017-02-21 MED ORDER — RIVAROXABAN 20 MG PO TABS
20.0000 mg | ORAL_TABLET | Freq: Every day | ORAL | Status: DC
  Administered 2017-02-22: 17:00:00 20 mg via ORAL
  Filled 2017-02-21 (×2): qty 1

## 2017-02-21 MED ORDER — DOXYCYCLINE HYCLATE 100 MG PO TABS
100.0000 mg | ORAL_TABLET | Freq: Once | ORAL | Status: AC
  Administered 2017-02-21: 100 mg via ORAL
  Filled 2017-02-21: qty 1

## 2017-02-21 MED ORDER — ALPRAZOLAM 1 MG PO TABS
1.0000 mg | ORAL_TABLET | Freq: Four times a day (QID) | ORAL | Status: DC | PRN
  Administered 2017-02-22 (×2): 1 mg via ORAL
  Filled 2017-02-21 (×2): qty 1

## 2017-02-21 MED ORDER — MONTELUKAST SODIUM 10 MG PO TABS
10.0000 mg | ORAL_TABLET | Freq: Every day | ORAL | Status: DC
  Administered 2017-02-22: 03:00:00 10 mg via ORAL
  Filled 2017-02-21 (×2): qty 1

## 2017-02-21 MED ORDER — MOMETASONE FURO-FORMOTEROL FUM 100-5 MCG/ACT IN AERO
2.0000 | INHALATION_SPRAY | Freq: Two times a day (BID) | RESPIRATORY_TRACT | Status: DC
  Administered 2017-02-22 – 2017-02-23 (×4): 2 via RESPIRATORY_TRACT
  Filled 2017-02-21: qty 8.8

## 2017-02-21 MED ORDER — IPRATROPIUM-ALBUTEROL 0.5-2.5 (3) MG/3ML IN SOLN
3.0000 mL | Freq: Once | RESPIRATORY_TRACT | Status: AC
  Administered 2017-02-21: 3 mL via RESPIRATORY_TRACT
  Filled 2017-02-21: qty 9

## 2017-02-21 MED ORDER — TIOTROPIUM BROMIDE MONOHYDRATE 18 MCG IN CAPS
1.0000 | ORAL_CAPSULE | Freq: Every day | RESPIRATORY_TRACT | Status: DC
  Administered 2017-02-22 – 2017-02-23 (×3): 18 ug via RESPIRATORY_TRACT
  Filled 2017-02-21: qty 5

## 2017-02-21 MED ORDER — ALBUTEROL SULFATE (2.5 MG/3ML) 0.083% IN NEBU: 2.5000 mg | INHALATION_SOLUTION | RESPIRATORY_TRACT | Status: DC | PRN

## 2017-02-21 MED ORDER — INSULIN ASPART 100 UNIT/ML ~~LOC~~ SOLN
0.0000 [IU] | Freq: Three times a day (TID) | SUBCUTANEOUS | Status: DC
  Administered 2017-02-22: 2 [IU] via SUBCUTANEOUS
  Administered 2017-02-22: 17:00:00 3 [IU] via SUBCUTANEOUS
  Administered 2017-02-23: 1 [IU] via SUBCUTANEOUS
  Filled 2017-02-21 (×3): qty 1

## 2017-02-21 MED ORDER — PREDNISONE 20 MG PO TABS
40.0000 mg | ORAL_TABLET | Freq: Every day | ORAL | Status: DC
  Administered 2017-02-22 – 2017-02-23 (×2): 40 mg via ORAL
  Filled 2017-02-21 (×2): qty 2

## 2017-02-21 MED ORDER — ONDANSETRON HCL 4 MG/2ML IJ SOLN: 4.0000 mg | Freq: Four times a day (QID) | INTRAMUSCULAR | Status: DC | PRN

## 2017-02-21 MED ORDER — IOPAMIDOL (ISOVUE-370) INJECTION 76%
75.0000 mL | Freq: Once | INTRAVENOUS | Status: AC | PRN
  Administered 2017-02-21: 75 mL via INTRAVENOUS

## 2017-02-21 MED ORDER — HYDROCODONE-ACETAMINOPHEN 5-325 MG PO TABS: 1.0000 | ORAL_TABLET | ORAL | Status: DC | PRN

## 2017-02-21 MED ORDER — POTASSIUM CHLORIDE CRYS ER 20 MEQ PO TBCR
40.0000 meq | EXTENDED_RELEASE_TABLET | Freq: Once | ORAL | Status: AC
  Administered 2017-02-22: 40 meq via ORAL
  Filled 2017-02-21: qty 2

## 2017-02-21 MED ORDER — GUAIFENESIN-DM 100-10 MG/5ML PO SYRP
5.0000 mL | ORAL_SOLUTION | ORAL | Status: DC | PRN
  Filled 2017-02-21: qty 5

## 2017-02-21 MED ORDER — INSULIN ASPART 100 UNIT/ML ~~LOC~~ SOLN: 0.0000 [IU] | Freq: Every day | SUBCUTANEOUS | Status: DC

## 2017-02-21 NOTE — ED Triage Notes (Signed)
[facility].  Pt having rales and coughing.  Per EMS, pt's granddaughter states that pt has felt bad x4 days.  Pt having green sputum.  Per EMS, pt 80% on RA.  Pt reports fever and chills to EDP

## 2017-02-21 NOTE — ED Provider Notes (Signed)
[facility] Emergency Department Provider Note  ____________________________________________  Time seen: Approximately 5:36 PM  I have reviewed the triage vital signs and the nursing notes.   HISTORY  Chief Complaint Shortness of Breath and Weakness   HPI Yvette Martinez is a 66 y.o. female with h/o COPD, smoking, DVTwho presents for respiratory distress and generalized weakness. patient reports a week of generalized weakness and cough productive of green sputum. She has had chills and progressively worsening shortness of breath. Patient continues to smoke. She was supposed to be on 3 L nasal cannula which she has not been using. She was recently diagnosed with a DVT at [facility] and was started on Xarelto however patient tells me that after reading on Facebook that this medication had many side effects she decided to stop 6 weeks ago. She denies any chest pain, leg pain and swelling. She has had nausea but no vomiting. No diarrhea, no sore throat, no body aches, no abdominal pain. Her shortness of breath is now moderate to severe and constant since earlier today.  Past Medical History:  Diagnosis Date  . COPD (chronic obstructive pulmonary disease) ([place])   . Diabetes mellitus without complication ([place])   . Diverticula of colon   . DVT (deep vein thrombosis) in pregnancy ([place])   . Pneumonia     Patient Active Problem List   Diagnosis Date Noted  . Salmonella gastroenteritis 07/10/2016  . Hypokalemia 07/10/2016  . Hypoglycemia 07/10/2016  . Tobacco dependence 07/10/2016  . COPD (chronic obstructive pulmonary disease) ([place]) 07/10/2016  . Anxiety and depression 07/10/2016  . Diverticulitis 07/07/2016  . Acute respiratory failure with hypoxia ([place]) 08/23/2015  . COPD exacerbation ([place]) 08/23/2015  . Diverticulitis of large intestine with perforation without bleeding 08/23/2015  . Diarrhea 08/23/2015  . Generalized weakness 08/23/2015  . Tobacco abuse  08/23/2015  . CAP (community acquired pneumonia) 08/20/2015    Past Surgical History:  Procedure Laterality Date  . ABDOMINAL HYSTERECTOMY    . CYSTECTOMY     patient states beind her bladder  . TONSILLECTOMY      Prior to Admission medications   Medication Sig Start Date End Date Taking? Authorizing Provider  albuterol (PROVENTIL) (2.5 MG/3ML) 0.083% nebulizer solution Take 3 mLs (2.5 mg total) by nebulization every 4 (four) hours as needed for wheezing or shortness of breath. 08/23/15   Theodoro Grist, MD  ALPRAZolam Duanne Moron) 1 MG tablet Take 1 tablet by mouth every 6 (six) hours as needed for anxiety.     [provider]  antiseptic oral rinse (CPC / CETYLPYRIDINIUM CHLORIDE 0.05%) 0.05 % LIQD solution 7 mLs by Mouth Rinse route 2 (two) times daily. 08/23/15   Theodoro Grist, MD  bisacodyl (DULCOLAX) 10 MG suppository Place 1 suppository (10 mg total) rectally as needed for moderate constipation. 04/17/15   Earleen Newport, MD  Fluticasone-Salmeterol (ADVAIR DISKUS) 100-50 MCG/DOSE AEPB Inhale 1 puff into the lungs 2 (two) times daily. 06/28/14   [provider]  ibuprofen (ADVIL,MOTRIN) 200 MG tablet Take by mouth.    [provider]  menthol-cetylpyridinium (CEPACOL) 3 MG lozenge Take 1 lozenge by mouth as needed for sore throat.    [provider]  mirtazapine (REMERON) 30 MG tablet Take 30 mg by mouth 2 (two) times daily.     [provider]  mometasone (NASONEX) 50 MCG/ACT nasal spray Place into the nose. 05/30/14   [provider]  montelukast (SINGULAIR) 10 MG tablet Take by mouth. 03/23/16 03/23/17  [provider]  rivaroxaban (XARELTO) 20 MG TABS tablet Take 20 mg by mouth daily with supper.    [provider]  tiotropium (SPIRIVA) 18 MCG inhalation capsule Place 1 capsule into inhaler and inhale daily. 06/28/14   [provider]  VENTOLIN HFA 108 (90 Base) MCG/ACT inhaler Inhale 1-2 puffs into the  lungs every 6 (six) hours as needed.  03/26/15   [provider]    Allergies Penicillins; Venlafaxine; and Sulfa antibiotics  Family History  Problem Relation Age of Onset  . Kidney cancer Neg Hx   . Bladder Cancer Neg Hx     Social History Social History   Tobacco Use  . Smoking status: Current Every Day Smoker  . Smokeless tobacco: Never Used  Substance Use Topics  . Alcohol use: Not on file  . Drug use: Not on file    Review of Systems  Constitutional: Negative for fever. + chills and generalized weakness Eyes: Negative for visual changes. ENT: Negative for sore throat. Neck: No neck pain  Cardiovascular: Negative for chest pain. Respiratory: + shortness of breath and productive cough Gastrointestinal: Negative for abdominal pain, vomiting or diarrhea. Genitourinary: Negative for dysuria. Musculoskeletal: Negative for back pain. Skin: Negative for rash. Neurological: Negative for headaches, weakness or numbness. Psych: No SI or HI  ____________________________________________   PHYSICAL EXAM:  VITAL SIGNS: Vitals:   02/21/17 1735  Pulse: (!) 108  Temp: 98.5 F (36.9 C)  SpO2: 93%    Constitutional: Alert and oriented, cachectic, pale, moderate respiratory distress.  HEENT:      Head: Normocephalic and atraumatic.         Eyes: Conjunctivae are normal. Sclera is non-icteric.       Mouth/Throat: Mucous membranes are dry.       Neck: Supple with no signs of meningismus. Cardiovascular: Tachycardic with regular rhythm. No murmurs, gallops, or rubs. 2+ symmetrical distal pulses are present in all extremities. No JVD. Respiratory: increased work of breathing, hypoxia, room air, satting 93% on 5 L nasal cannula, diffuse crackles bilaterally Gastrointestinal: Soft, non tender, and non distended with positive bowel sounds. No rebound or guarding. Musculoskeletal: Nontender with normal range of motion in all extremities. No edema, cyanosis, or erythema  of extremities. Neurologic: Normal speech and language. Face is symmetric. Moving all extremities. No gross focal neurologic deficits are appreciated. Skin: Skin is warm, dry and intact. No rash noted. Psychiatric: Mood and affect are normal. Speech and behavior are normal.  ____________________________________________   LABS (all labs ordered are listed, but only abnormal results are displayed)  Labs Reviewed  CBC WITH DIFFERENTIAL/PLATELET - Abnormal; Notable for the following components:      Result Value   Lymphs Abs 0.5 (*)    All other components within normal limits  BASIC METABOLIC PANEL - Abnormal; Notable for the following components:   Potassium 3.3 (*)    Chloride 100 (*)    Glucose, Bld 111 (*)    BUN 25 (*)    All other components within normal limits  TROPONIN I - Abnormal; Notable for the following components:   Troponin I 0.05 (*)    All other components within normal limits  BLOOD GAS, VENOUS - Abnormal; Notable for the following components:   Bicarbonate 30.4 (*)    Acid-Base Excess 3.2 (*)    All other components within normal limits  CULTURE, BLOOD (ROUTINE X 2)  CULTURE, BLOOD (ROUTINE X 2)  LACTIC ACID, PLASMA   ____________________________________________  EKG  ED ECG REPORT I, Rudene Re, the attending physician, personally viewed and interpreted this ECG.  Sinus tachycardia, rate of 106, normal intervals, right axis deviation, and no ST elevations or depressions. No significant changes when compared to prior from June 2017 ____________________________________________  RADIOLOGY  CXR: 1. Mild diffuse interstitial prominence could relate to mild edema. 2. Streaky atelectasis at the right lung base.  CTA chest: Patchy bilateral reticulonodular and tree-in-bud pattern of opacification worse over the lower lobes with a few subcentimeter scattered cavitary nodules. Bibasilar dependent atelectasis. Findings may be due to bacterial infection,  atypical infection such as MAC or inflammatory process. Recommend followup CT 6 weeks.  No evidence of pulmonary embolism.  1.1 cm lymph node over the superior mediastinum abutting the right lateral aspect of the upper esophagus unchanged from 2015.  Mild anterior wedging of an upper thoracic vertebral body with depression of the superior endplate age indeterminate, but likely chronic. ____________________________________________   PROCEDURES  Procedure(s) performed: None Procedures Critical Care performed: yes  CRITICAL CARE Performed by: Rudene Re  ?  Total critical care time: 40 min  Critical care time was exclusive of separately billable procedures and treating other patients.  Critical care was necessary to treat or prevent imminent or life-threatening deterioration.  Critical care was time spent personally by me on the following activities: development of treatment plan with patient and/or surrogate as well as nursing, discussions with consultants, evaluation of patient's response to treatment, examination of patient, obtaining history from patient or surrogate, ordering and performing treatments and interventions, ordering and review of laboratory studies, ordering and review of radiographic studies, pulse oximetry and re-evaluation of patient's condition.  ____________________________________________   INITIAL IMPRESSION / ASSESSMENT AND PLAN / ED COURSE   66 y.o. female with h/o COPD, smoking, DVTwho presents for respiratory distress and generalized weakness in the setting of one week of productive cough. Patient is in moderate distress, she looks cachectic and pale, she has thick green sputum in her mouth and diffuse crackles in bilateral lung fields, she has no pitting edema, no fever. She is hypoxic on room air requiring 5 L [place] with diffuse crackles on bilateral lung fields. Ddx PNA vs COPD exacerbation vs PE with recent DVT and not on anticoagulation vs  CHF. CXR show interstitial prominence that could be due to edema but also due to pneumonia. Will pursue CT angio. VBG WNL. Troponin elevated at 0.05. EKG with no ischemia, most likely demand ischemia. Normal lactate and WBC.     _________________________ 7:23 PM on 02/21/2017 -----------------------------------------  Imaging consistent with pneumonia and no evidence of PE. Patient is currently on 5 L nasal cannula. We'll start patient on Rocephin and doxycycline for community-acquired pneumonia and get her admitted to the hospitalist service. Troponin slightly elevated at 0.05 consistent with a demand ischemia.   As part of my medical decision making, I reviewed the following data within the [place] notes reviewed and incorporated, Labs reviewed , EKG interpreted , Old EKG reviewed, Old chart reviewed, Radiograph reviewed , Discussed with admitting physician , Notes from prior ED visits and [place] Controlled Substance Database    Pertinent labs & imaging results that were available during my care of the patient were reviewed by me and considered in my medical decision making (see chart for details).    ____________________________________________   FINAL CLINICAL IMPRESSION(S) / ED DIAGNOSES  Final diagnoses:  Acute respiratory failure with hypoxia ([place])  Community acquired pneumonia, unspecified laterality  Demand ischemia  of myocardium ([place])      NEW MEDICATIONS STARTED DURING THIS VISIT:  ED Discharge Orders    None       Note:  This document was prepared using Dragon voice recognition software and may include unintentional dictation errors.    Rudene Re, MD 02/21/17 [phone]

## 2017-02-21 NOTE — Consult Note (Signed)
Name: Yvette Martinez MRN: [id number] DOB: 08-05-1950    ADMISSION DATE:  02/21/2017 CONSULTATION DATE: 02/21/2017  REFERRING MD : Dr. Bridgett Larsson   CHIEF COMPLAINT: Shortness of Breath and Weakness   BRIEF PATIENT DESCRIPTION:  66 yo female admitted 12/24 with acute on chronic respiratory failure secondary to suspected pneumonia and AECOPD  SIGNIFICANT EVENTS  12/24-Pt admitted to medsurg unit   STUDIES:  CT Angio Chest 12/24>>Patchy bilateral reticulonodular and tree-in-bud pattern of opacification worse over the lower lobes with a few subcentimeter scattered cavitary nodules. Bibasilar dependent atelectasis. Findings may be due to bacterial infection, atypical infection such as MAC or inflammatory process. Recommend followup CT 6 weeks. No evidence of pulmonary embolism. 1.1 cm lymph node over the superior mediastinum abutting the right lateral aspect of the upper esophagus unchanged from 2015. Mild anterior wedging of an upper thoracic vertebral body with depression of the superior endplate age indeterminate, but likely chronic.  HISTORY OF PRESENT ILLNESS:   This is a 66 yo female with a PMH of Current Everyday Smoker, COPD, Diabetes Mellitus, Diverticula of Colon, Pneumonia, and DVT (previously on Xarelto, however pt stopped taking medication 6 weeks ago due to concerns of possible side effects).  She presented to [facility] ER 12/24 with c/o shortness of breath, chills, nausea, productive cough with green sputum, and generalized weakness onset 1 week prior to presentation.  She is suppose to be on chronic home O2 @3L , however she has been noncompliant and continues to smoke. CT Angio Chest in ER ruled out pulmonary embolism, however concerning for possible bacterial or atypical infection.  She was subsequently admitted to the medsurg unit by hopitialist team for further workup and treatment. PCCM consulted for additional management of pneumonia.  PAST MEDICAL HISTORY :   has a past medical  history of COPD (chronic obstructive pulmonary disease) ([place]), Diabetes mellitus without complication ([place]), Diverticula of colon, DVT (deep vein thrombosis) in pregnancy ([place]), and Pneumonia.  has a past surgical history that includes Abdominal hysterectomy; Tonsillectomy; and Cystectomy. Prior to Admission medications   Medication Sig Start Date End Date Taking? Authorizing Provider  ALPRAZolam Duanne Moron) 1 MG tablet Take 1 tablet by mouth every 6 (six) hours as needed for anxiety.    Yes [provider]  bisacodyl (DULCOLAX) 10 MG suppository Place 1 suppository (10 mg total) rectally as needed for moderate constipation. 04/17/15  Yes Earleen Newport, MD  Fluticasone-Salmeterol (ADVAIR DISKUS) 100-50 MCG/DOSE AEPB Inhale 1 puff into the lungs 2 (two) times daily. 06/28/14  Yes [provider]  ibuprofen (ADVIL,MOTRIN) 200 MG tablet Take 200-400 mg by mouth every 6 (six) hours as needed.    Yes [provider]  mirtazapine (REMERON) 30 MG tablet Take 30 mg by mouth 2 (two) times daily.    Yes [provider]  montelukast (SINGULAIR) 10 MG tablet Take 10 mg by mouth daily.  03/23/16 03/23/17 Yes [provider]  rivaroxaban (XARELTO) 20 MG TABS tablet Take 20 mg by mouth daily with supper.   Yes [provider]  tiotropium (SPIRIVA) 18 MCG inhalation capsule Place 1 capsule into inhaler and inhale daily. 06/28/14  Yes [provider]  VENTOLIN HFA 108 (90 Base) MCG/ACT inhaler Inhale 1-2 puffs into the lungs every 6 (six) hours as needed.  03/26/15  Yes [provider]  albuterol (PROVENTIL) (2.5 MG/3ML) 0.083% nebulizer solution Take 3 mLs (2.5 mg total) by nebulization every 4 (four) hours as needed for wheezing or shortness of breath. Patient not  taking: Reported on 02/21/2017 08/23/15   Theodoro Grist, MD   Allergies  Allergen Reactions  . Penicillins Rash  . Venlafaxine Swelling  . Sulfa Antibiotics Hives    FAMILY  HISTORY:  family history is not on file. SOCIAL HISTORY:  reports that she has been smoking.  she has never used smokeless tobacco.  REVIEW OF SYSTEMS: Positives in BOLD  Constitutional: fever, chills, weight loss, malaise/fatigue and diaphoresis.  HENT: Negative for hearing loss, ear pain, nosebleeds, congestion, sore throat, neck pain, tinnitus and ear discharge.   Eyes: Negative for blurred vision, double vision, photophobia, pain, discharge and redness.  Respiratory: cough, hemoptysis, sputum production, shortness of breath, wheezing and stridor.   Cardiovascular: Negative for chest pain, palpitations, orthopnea, claudication, leg swelling and PND.  Gastrointestinal: heartburn, nausea, vomiting, abdominal pain, diarrhea, constipation, blood in stool and melena.  Genitourinary: Negative for dysuria, urgency, frequency, hematuria and flank pain.  Musculoskeletal: Negative for myalgias, back pain, joint pain and falls.  Skin: Negative for itching and rash.  Neurological: dizziness, tingling, tremors, sensory change, speech change, focal weakness, seizures, loss of consciousness, weakness and headaches.  Endo/Heme/Allergies: Negative for environmental allergies and polydipsia. Does not bruise/bleed easily.  SUBJECTIVE:  Pt states wheezing has improved since admission denies current shortness of breath.  VITAL SIGNS: Temp:  [97.8 F (36.6 C)-98.5 F (36.9 C)] 97.8 F (36.6 C) (12/24 2115) Pulse Rate:  [102-109] 104 (12/24 2115) Resp:  [17-34] 17 (12/24 2115) BP: (105-124)/(55-87) 105/70 (12/24 2115) SpO2:  [93 %-100 %] 93 % (12/24 2115) Weight:  [55.4 kg (122 lb 1.6 oz)-59 kg (130 lb)] 55.4 kg (122 lb 1.6 oz) (12/24 2115)  PHYSICAL EXAMINATION: General: female resting in bed, NAD  Neuro: oriented, lethargic, follows commands  HEENT: supple, no JVD  Cardiovascular: s1s2, no M Lungs: rhonchi with expiratory wheezes throughout, even, non labored  Abdomen: hyperactive BS x4, soft,  non tender, non distended  Musculoskeletal: normal bulk and tone, no edema  Skin: intact no rashes or lesions   Recent Labs  Lab 02/21/17 1737  NA 136  K 3.3*  CL 100*  CO2 27  BUN 25*  CREATININE 0.93  GLUCOSE 111*   Recent Labs  Lab 02/21/17 1737  HGB 15.3  HCT 45.1  WBC 6.3  PLT 198   Ct Angio Chest Pe W And/or Wo Contrast  Result Date: 02/21/2017 CLINICAL DATA:  Cough with fever and shortness of breath 4 days. EXAM: CT ANGIOGRAPHY CHEST WITH CONTRAST TECHNIQUE: Multidetector CT imaging of the chest was performed using the standard protocol during bolus administration of intravenous contrast. Multiplanar CT image reconstructions and MIPs were obtained to evaluate the vascular anatomy. CONTRAST:  62m ISOVUE-370 IOPAMIDOL (ISOVUE-370) INJECTION 76% COMPARISON:  Chest CT 05/31/2013 and 07/18/2012 FINDINGS: Cardiovascular: Heart is normal size. Very minimal calcification of the thoracic aorta which is otherwise unremarkable. No evidence of pulmonary embolism. Mediastinum/Nodes: No significant mediastinal or hilar adenopathy. Lymph node along the right lateral aspect of the upper esophagus in the superior mediastinum measuring 1.1 cm by short axis which is enlarged compared to 2014 although unchanged compared to 2015. Remaining mediastinal structures are unremarkable. Lungs/Pleura: Lungs are well inflated and demonstrate patchy bilateral reticulonodular/tree-in-bud pattern of opacification most prominent over the lower lobes. There are a couple tiny scattered subcentimeter cavitary nodules with the largest over the right lower lobe measuring 7 mm. Mild posterior bibasilar airspace opacification likely atelectasis. No effusion. Moderate narrowing and possible focal obstruction of a few prox right lower lobar bronchi.  Upper Abdomen: No acute abnormality. Musculoskeletal: Mild degenerate change of the spine. Mild depression of the superior endplate of an upper thoracic vertebral body age  indeterminate. Review of the MIP images confirms the above findings. IMPRESSION: Patchy bilateral reticulonodular and tree-in-bud pattern of opacification worse over the lower lobes with a few subcentimeter scattered cavitary nodules. Bibasilar dependent atelectasis. Findings may be due to bacterial infection, atypical infection such as MAC or inflammatory process. Recommend followup CT 6 weeks. No evidence of pulmonary embolism. 1.1 cm lymph node over the superior mediastinum abutting the right lateral aspect of the upper esophagus unchanged from 2015. Mild anterior wedging of an upper thoracic vertebral body with depression of the superior endplate age indeterminate, but likely chronic. Electronically Signed   By: Marin Olp M.D.   On: 02/21/2017 19:08   Dg Chest Portable 1 View  Result Date: 02/21/2017 CLINICAL DATA:  Shortness of breath and hypoxia EXAM: PORTABLE CHEST 1 VIEW COMPARISON:  08/19/2015 FINDINGS: Linear atelectasis at the right lung base. No large effusion. Mild diffuse interstitial prominence. Normal cardiomediastinal silhouette with atherosclerosis. No pneumothorax IMPRESSION: 1. Mild diffuse interstitial prominence could relate to mild edema. 2. Streaky atelectasis at the right lung base. Electronically Signed   By: Donavan Foil M.D.   On: 02/21/2017 17:59    ASSESSMENT / PLAN: Acute on chronic respiratory failure secondary to suspected pneumonia and AECOPD  Hx: DVT, Current Everyday Smoker, Chronic Home O2 P: Supplemental O2 for dyspnea and hypoxia Maintain O2 sats 88% to 92% Continue scheduled and prn bronchodilator therapy  Continue prednisone  Continue levaquin  Will check influenza pcr panel  Follow cultures Trend WBC and monitor fever curve Trend PCT  Continue xarelto Followup CT Angio Chest in outpatient setting  Smoking Cessation Counseling provided   Marda Stalker, [place] Pager [phone] (please enter 7 digits) PCCM Consult Pager  [phone] (please enter 7 digits)    PCCM ATTENDING ATTESTATION: I have evaluated patient with the APP [place], reviewed database in its entirety and discussed care plan in detail. In addition, this patient was discussed on multidisciplinary rounds.   The CT chest findings are very interesting with diffuse reticulonodular changes in both lungs. Review of a CTAP in May of this year reveals that these findings are new since then. Her presentation suggests an acute infectious process - likely atypical such as viral or atypical bacterial. These findings could also represent a noninfectious inflammatory process or less likely hematogenous pulmonary metastases.     PLAN/REC: - Complete 5-7 days abx - either levofloxacin or doxycycline are reasonable choices - Complete 5 days of prednisone (40 mg daily. No taper should be needed) - Smoking cessation!!! - Repeat CT chest in 6 weeks - if findings persist and are not improving, reconsult Pulmonary medicine for bronchoscopy with TBBx  Merton Border, MD PCCM service [place] [phone] Pager [phone] 02/22/2017 12:08 PM

## 2017-02-21 NOTE — ED Notes (Signed)
Date and time results received: 02/21/17 1830  Test: Trop Critical Value: 0.05  Name of Provider Notified: Dr. Don PerkingVeronese  Orders Received? Or Actions Taken?: Acknowledged

## 2017-02-21 NOTE — H&P (Addendum)
[facility] NAME: Yvette Martinez    MR#:  [id number]  DATE OF BIRTH:  Jan 02, 1951  DATE OF ADMISSION:  02/21/2017  PRIMARY CARE PHYSICIAN: [facility]   REQUESTING/REFERRING PHYSICIAN: Rudene Re, MD  CHIEF COMPLAINT:   Chief Complaint  Patient presents with  . Shortness of Breath  . Weakness   Cough, sputum and shortness of breath for 1 week. HISTORY OF PRESENT ILLNESS:  Yvette Martinez  is a 66 y.o. female with a known history as below.  The patient presented to ED with above chief complaints.  She denies any fever but has chills and generalized weakness.  She was diagnosed with DVT 2 months ago, given Xarelto, but she stopped taking Xarelto herself concerning side effect.  She was found hypoxia, put on oxygen 6 L in the ED.  CT angiogram did not show PE but does show bilateral patchy opacity.  She is given antibiotics in the ED.  PAST MEDICAL HISTORY:   Past Medical History:  Diagnosis Date  . COPD (chronic obstructive pulmonary disease) ([place])   . Diabetes mellitus without complication ([place])   . Diverticula of colon   . DVT (deep vein thrombosis) in pregnancy ([place])   . Pneumonia     PAST SURGICAL HISTORY:   Past Surgical History:  Procedure Laterality Date  . ABDOMINAL HYSTERECTOMY    . CYSTECTOMY     patient states beind her bladder  . TONSILLECTOMY      SOCIAL HISTORY:   Social History   Tobacco Use  . Smoking status: Current Every Day Smoker  . Smokeless tobacco: Never Used  Substance Use Topics  . Alcohol use: Not on file    FAMILY HISTORY:   Family History  Problem Relation Age of Onset  . CAD Father   . Aneurysm Father   . Diabetes Brother   . Cancer Sister   . Kidney cancer Neg Hx   . Bladder Cancer Neg Hx     DRUG ALLERGIES:   Allergies  Allergen Reactions  . Penicillins Rash  . Venlafaxine Swelling  . Sulfa Antibiotics Hives    REVIEW OF SYSTEMS:   Review of  Systems  Constitutional: Positive for malaise/fatigue. Negative for chills and fever.  HENT: Negative for sore throat.   Eyes: Negative for blurred vision and double vision.  Respiratory: Positive for cough, sputum production and shortness of breath. Negative for hemoptysis, wheezing and stridor.   Cardiovascular: Negative for chest pain, palpitations, orthopnea and leg swelling.  Gastrointestinal: Negative for abdominal pain, blood in stool, diarrhea, melena, nausea and vomiting.  Genitourinary: Negative for dysuria, flank pain and hematuria.  Musculoskeletal: Negative for back pain and joint pain.  Neurological: Positive for weakness. Negative for dizziness, sensory change, focal weakness, seizures, loss of consciousness and headaches.  Endo/Heme/Allergies: Negative for polydipsia.  Psychiatric/Behavioral: Negative for depression. The patient is not nervous/anxious.     MEDICATIONS AT HOME:   Prior to Admission medications   Medication Sig Start Date End Date Taking? Authorizing Provider  albuterol (PROVENTIL) (2.5 MG/3ML) 0.083% nebulizer solution Take 3 mLs (2.5 mg total) by nebulization every 4 (four) hours as needed for wheezing or shortness of breath. 08/23/15   Theodoro Grist, MD  ALPRAZolam Duanne Moron) 1 MG tablet Take 1 tablet by mouth every 6 (six) hours as needed for anxiety.     [provider]  antiseptic oral rinse (CPC / CETYLPYRIDINIUM CHLORIDE 0.05%) 0.05 % LIQD solution 7 mLs  by Mouth Rinse route 2 (two) times daily. 08/23/15   Theodoro Grist, MD  bisacodyl (DULCOLAX) 10 MG suppository Place 1 suppository (10 mg total) rectally as needed for moderate constipation. 04/17/15   Earleen Newport, MD  Fluticasone-Salmeterol (ADVAIR DISKUS) 100-50 MCG/DOSE AEPB Inhale 1 puff into the lungs 2 (two) times daily. 06/28/14   [provider]  ibuprofen (ADVIL,MOTRIN) 200 MG tablet Take by mouth.    [provider]  menthol-cetylpyridinium (CEPACOL) 3 MG lozenge  Take 1 lozenge by mouth as needed for sore throat.    [provider]  mirtazapine (REMERON) 30 MG tablet Take 30 mg by mouth 2 (two) times daily.     [provider]  mometasone (NASONEX) 50 MCG/ACT nasal spray Place into the nose. 05/30/14   [provider]  montelukast (SINGULAIR) 10 MG tablet Take by mouth. 03/23/16 03/23/17  [provider]  rivaroxaban (XARELTO) 20 MG TABS tablet Take 20 mg by mouth daily with supper.    [provider]  tiotropium (SPIRIVA) 18 MCG inhalation capsule Place 1 capsule into inhaler and inhale daily. 06/28/14   [provider]  VENTOLIN HFA 108 (90 Base) MCG/ACT inhaler Inhale 1-2 puffs into the lungs every 6 (six) hours as needed.  03/26/15   [provider]      VITAL SIGNS:  Blood pressure (!) 123/55, pulse (!) 109, temperature 98.5 F (36.9 C), temperature source Oral, resp. rate (!) 27, weight 130 lb (59 kg), SpO2 93 %.  PHYSICAL EXAMINATION:  Physical Exam  GENERAL:  66 y.o.-year-old patient lying in the bed with no acute distress.  EYES: Pupils equal, round, reactive to light and accommodation. No scleral icterus. Extraocular muscles intact.  HEENT: Head atraumatic, normocephalic. Oropharynx and nasopharynx clear.  NECK:  Supple, no jugular venous distention. No thyroid enlargement, no tenderness.  LUNGS: Normal breath sounds bilaterally, no wheezing, bilateral crackles. No use of accessory muscles of respiration.  CARDIOVASCULAR: S1, S2 normal. No murmurs, rubs, or gallops.  ABDOMEN: Soft, nontender, nondistended. Bowel sounds present. No organomegaly or mass.  EXTREMITIES: No pedal edema, cyanosis, or clubbing.  NEUROLOGIC: Cranial nerves II through XII are intact. Muscle strength 5/5 in all extremities. Sensation intact. Gait not checked.  PSYCHIATRIC: The patient is alert and oriented x 3.  SKIN: No obvious rash, lesion, or ulcer.   LABORATORY PANEL:   CBC Recent Labs  Lab  02/21/17 1737  WBC 6.3  HGB 15.3  HCT 45.1  PLT 198   ------------------------------------------------------------------------------------------------------------------  Chemistries  Recent Labs  Lab 02/21/17 1737  NA 136  K 3.3*  CL 100*  CO2 27  GLUCOSE 111*  BUN 25*  CREATININE 0.93  CALCIUM 8.9   ------------------------------------------------------------------------------------------------------------------  Cardiac Enzymes Recent Labs  Lab 02/21/17 1737  TROPONINI 0.05*   ------------------------------------------------------------------------------------------------------------------  RADIOLOGY:  Ct Angio Chest Pe W And/or Wo Contrast  Result Date: 02/21/2017 CLINICAL DATA:  Cough with fever and shortness of breath 4 days. EXAM: CT ANGIOGRAPHY CHEST WITH CONTRAST TECHNIQUE: Multidetector CT imaging of the chest was performed using the standard protocol during bolus administration of intravenous contrast. Multiplanar CT image reconstructions and MIPs were obtained to evaluate the vascular anatomy. CONTRAST:  36m ISOVUE-370 IOPAMIDOL (ISOVUE-370) INJECTION 76% COMPARISON:  Chest CT 05/31/2013 and 07/18/2012 FINDINGS: Cardiovascular: Heart is normal size. Very minimal calcification of the thoracic aorta which is otherwise unremarkable. No evidence of pulmonary embolism. Mediastinum/Nodes: No significant mediastinal or hilar adenopathy. Lymph node along the right lateral aspect of the upper  esophagus in the superior mediastinum measuring 1.1 cm by short axis which is enlarged compared to 2014 although unchanged compared to 2015. Remaining mediastinal structures are unremarkable. Lungs/Pleura: Lungs are well inflated and demonstrate patchy bilateral reticulonodular/tree-in-bud pattern of opacification most prominent over the lower lobes. There are a couple tiny scattered subcentimeter cavitary nodules with the largest over the right lower lobe measuring 7 mm. Mild posterior  bibasilar airspace opacification likely atelectasis. No effusion. Moderate narrowing and possible focal obstruction of a few prox right lower lobar bronchi. Upper Abdomen: No acute abnormality. Musculoskeletal: Mild degenerate change of the spine. Mild depression of the superior endplate of an upper thoracic vertebral body age indeterminate. Review of the MIP images confirms the above findings. IMPRESSION: Patchy bilateral reticulonodular and tree-in-bud pattern of opacification worse over the lower lobes with a few subcentimeter scattered cavitary nodules. Bibasilar dependent atelectasis. Findings may be due to bacterial infection, atypical infection such as MAC or inflammatory process. Recommend followup CT 6 weeks. No evidence of pulmonary embolism. 1.1 cm lymph node over the superior mediastinum abutting the right lateral aspect of the upper esophagus unchanged from 2015. Mild anterior wedging of an upper thoracic vertebral body with depression of the superior endplate age indeterminate, but likely chronic. Electronically Signed   By: Marin Olp M.D.   On: 02/21/2017 19:08   Dg Chest Portable 1 View  Result Date: 02/21/2017 CLINICAL DATA:  Shortness of breath and hypoxia EXAM: PORTABLE CHEST 1 VIEW COMPARISON:  08/19/2015 FINDINGS: Linear atelectasis at the right lung base. No large effusion. Mild diffuse interstitial prominence. Normal cardiomediastinal silhouette with atherosclerosis. No pneumothorax IMPRESSION: 1. Mild diffuse interstitial prominence could relate to mild edema. 2. Streaky atelectasis at the right lung base. Electronically Signed   By: Donavan Foil M.D.   On: 02/21/2017 17:59      IMPRESSION AND PLAN:   Acute respiratory failure with hypoxia due to bilateral pneumonia, CAP. The patient will be admitted to medical floor. Start Levaquin IV, follow-up blood culture and sputum culture,  NEB as needed.  Pulmonary consult.  Elevated troponin, demanding ischemia due to above.   Continue Xarelto.  Hypokalemia.  Give potassium supplement and follow-up level. COPD.  Stable.  Continue nebulizer as needed, Dulera and Spiriva. History of DVT.  Resume Xarelto. Diabetes.  Start a sliding scale. Tobacco abuse.  Smoking cessation was counseled for 4 minutes.  Nicotine patch.  All the records are reviewed and case discussed with ED provider. Management plans discussed with the patient, family and they are in agreement.  CODE STATUS: Full code TOTAL TIME TAKING CARE OF THIS PATIENT: 58 minutes.    Demetrios Loll M.D on 02/21/2017 at 8:12 PM  Between 7am to 6pm - Pager - [phone]  After 6pm go to [email or web address] - Proofreader  [facility]  Office  [phone]  CC: Primary care physician; [facility]   Note: This dictation was prepared with Dragon dictation along with smaller phrase technology. Any transcriptional errors that result from this process are unin

## 2017-02-21 NOTE — Progress Notes (Addendum)
Pharmacy Antibiotic Note  Yvette Martinez is a 66 y.o. female admitted on 02/21/2017 with pneumonia.  Pharmacy has been consulted for Levaquin dosing.  Plan: Levaquin 750 mg q 48 hours ordered.  Height: 5\' 1"  (154.9 cm) Weight: 122 lb 1.6 oz (55.4 kg) IBW/kg (Calculated) : 47.8  Temp (24hrs), Avg:98.2 F (36.8 C), Min:97.8 F (36.6 C), Max:98.5 F (36.9 C)  Recent Labs  Lab 02/21/17 1737  WBC 6.3  CREATININE 0.93  LATICACIDVEN 1.9    Estimated Creatinine Clearance: 43.7 mL/min (by C-G formula based on SCr of 0.93 mg/dL).    Allergies  Allergen Reactions  . Penicillins Rash  . Venlafaxine Swelling  . Sulfa Antibiotics Hives    Antimicrobials this admission: Ceftriaxone, doxycycline x1 Levaquin 12/24  >>    >>   Dose adjustments this admission:   Microbiology results: 12/24 BCx: pending 12/24 Sputum: pending       12/25 CXR: pending  Thank you for allowing pharmacy to be a part of this patient's care.   Yvette Martinez, PharmD, BCPS  02/21/17 10:30 PM

## 2017-02-22 ENCOUNTER — Other Ambulatory Visit: Payer: Self-pay

## 2017-02-22 ENCOUNTER — Inpatient Hospital Stay: Payer: Medicare HMO

## 2017-02-22 DIAGNOSIS — R918 Other nonspecific abnormal finding of lung field: Secondary | ICD-10-CM

## 2017-02-22 DIAGNOSIS — J9601 Acute respiratory failure with hypoxia: Secondary | ICD-10-CM

## 2017-02-22 DIAGNOSIS — F17200 Nicotine dependence, unspecified, uncomplicated: Secondary | ICD-10-CM

## 2017-02-22 LAB — INFLUENZA PANEL BY PCR (TYPE A & B)
Influenza A By PCR: NEGATIVE
Influenza B By PCR: NEGATIVE

## 2017-02-22 LAB — CBC
HCT: 40.7 % (ref 35.0–47.0)
Hemoglobin: 13.8 g/dL (ref 12.0–16.0)
MCH: 33.2 pg (ref 26.0–34.0)
MCHC: 33.9 g/dL (ref 32.0–36.0)
MCV: 97.9 fL (ref 80.0–100.0)
Platelets: 164 K/uL (ref 150–440)
RBC: 4.16 MIL/uL (ref 3.80–5.20)
RDW: 13.1 % (ref 11.5–14.5)
WBC: 6.4 K/uL (ref 3.6–11.0)

## 2017-02-22 LAB — EXPECTORATED SPUTUM ASSESSMENT W GRAM STAIN, RFLX TO RESP C

## 2017-02-22 LAB — GLUCOSE, CAPILLARY
GLUCOSE-CAPILLARY: 197 mg/dL — AB (ref 65–99)
GLUCOSE-CAPILLARY: 205 mg/dL — AB (ref 65–99)
GLUCOSE-CAPILLARY: 135 mg/dL — AB (ref 65–99)
Glucose-Capillary: 162 mg/dL — ABNORMAL HIGH (ref 65–99)
Glucose-Capillary: 91 mg/dL (ref 65–99)

## 2017-02-22 LAB — BASIC METABOLIC PANEL WITH GFR
Anion gap: 8 (ref 5–15)
BUN: 19 mg/dL (ref 6–20)
CO2: 28 mmol/L (ref 22–32)
Calcium: 8.9 mg/dL (ref 8.9–10.3)
Chloride: 102 mmol/L (ref 101–111)
Creatinine, Ser: 0.87 mg/dL (ref 0.44–1.00)
GFR calc Af Amer: >60 mL/min
GFR calc non Af Amer: >60 mL/min
Glucose, Bld: 222 mg/dL — ABNORMAL HIGH (ref 65–99)
Potassium: 3.3 mmol/L — ABNORMAL LOW (ref 3.5–5.1)
Sodium: 138 mmol/L (ref 135–145)

## 2017-02-22 LAB — STREP PNEUMONIAE URINARY ANTIGEN: Strep Pneumo Urinary Antigen: NEGATIVE

## 2017-02-22 MED ORDER — ORAL CARE MOUTH RINSE: 15.0000 mL | Freq: Two times a day (BID) | OROMUCOSAL | Status: DC

## 2017-02-22 NOTE — Progress Notes (Signed)
[facility] [place] NAME: Yvette Martinez    MR#:  [id number]  DATE OF BIRTH:  10-11-1950  SUBJECTIVE:  CHIEF COMPLAINT:   Chief Complaint  Patient presents with  . Shortness of Breath  . Weakness   Still has cough, sputum and shortness of breath.  On O2 [place] 4 L REVIEW OF SYSTEMS:  Review of Systems  Constitutional: Positive for malaise/fatigue. Negative for chills and fever.  HENT: Negative for sore throat.   Eyes: Negative for blurred vision and double vision.  Respiratory: Positive for cough, sputum production, shortness of breath and wheezing. Negative for hemoptysis and stridor.   Cardiovascular: Negative for chest pain, palpitations, orthopnea and leg swelling.  Gastrointestinal: Negative for abdominal pain, blood in stool, diarrhea, melena, nausea and vomiting.  Genitourinary: Negative for dysuria, flank pain and hematuria.  Musculoskeletal: Negative for back pain and joint pain.  Skin: Negative for rash.  Neurological: Positive for weakness. Negative for dizziness, sensory change, focal weakness, seizures, loss of consciousness and headaches.  Endo/Heme/Allergies: Negative for polydipsia.  Psychiatric/Behavioral: Negative for depression. The patient is not nervous/anxious.     DRUG ALLERGIES:   Allergies  Allergen Reactions  . Penicillins Rash  . Venlafaxine Swelling  . Sulfa Antibiotics Hives   VITALS:  Blood pressure (!) 102/53, pulse 90, temperature 98 F (36.7 C), resp. rate 20, height _0  (1.549 m), weight 122 lb 1.6 oz (55.4 kg), SpO2 92 %. PHYSICAL EXAMINATION:  Physical Exam  Constitutional: She is oriented to person, place, and time and well-developed, well-nourished, and in no distress.  HENT:  Head: Normocephalic.  Mouth/Throat: Oropharynx is clear and moist.  Eyes: Conjunctivae and EOM are normal. Pupils are equal, round, and reactive to light. No scleral icterus.  Neck: Normal range of motion. Neck  supple. No JVD present. No tracheal deviation present.  Cardiovascular: Normal rate, regular rhythm and normal heart sounds. Exam reveals no gallop.  No murmur heard. Pulmonary/Chest: Effort normal. No respiratory distress. She has wheezes. She has no rales.  Bilateral crackles and rhonchi.  Abdominal: Soft. Bowel sounds are normal. She exhibits no distension. There is no tenderness. There is no rebound.  Musculoskeletal: Normal range of motion. She exhibits no edema or tenderness.  Neurological: She is alert and oriented to person, place, and time. No cranial nerve deficit.  Skin: No rash noted. No erythema.  Psychiatric: Affect normal.   LABORATORY PANEL:  Female CBC Recent Labs  Lab 02/22/17 0446  WBC 6.4  HGB 13.8  HCT 40.7  PLT 164   ------------------------------------------------------------------------------------------------------------------ Chemistries  Recent Labs  Lab 02/21/17 2207 02/22/17 0446  NA  --  138  K  --  3.3*  CL  --  102  CO2  --  28  GLUCOSE  --  222*  BUN  --  19  CREATININE  --  0.87  CALCIUM  --  8.9  MG 2.3  --    RADIOLOGY:  Ct Angio Chest Pe W And/or Wo Contrast  Result Date: 02/21/2017 CLINICAL DATA:  Cough with fever and shortness of breath 4 days. EXAM: CT ANGIOGRAPHY CHEST WITH CONTRAST TECHNIQUE: Multidetector CT imaging of the chest was performed using the standard protocol during bolus administration of intravenous contrast. Multiplanar CT image reconstructions and MIPs were obtained to evaluate the vascular anatomy. CONTRAST:  20m ISOVUE-370 IOPAMIDOL (ISOVUE-370) INJECTION 76% COMPARISON:  Chest CT 05/31/2013 and 07/18/2012 FINDINGS: Cardiovascular: Heart is normal size. Very minimal calcification of the  thoracic aorta which is otherwise unremarkable. No evidence of pulmonary embolism. Mediastinum/Nodes: No significant mediastinal or hilar adenopathy. Lymph node along the right lateral aspect of the upper esophagus in the superior  mediastinum measuring 1.1 cm by short axis which is enlarged compared to 2014 although unchanged compared to 2015. Remaining mediastinal structures are unremarkable. Lungs/Pleura: Lungs are well inflated and demonstrate patchy bilateral reticulonodular/tree-in-bud pattern of opacification most prominent over the lower lobes. There are a couple tiny scattered subcentimeter cavitary nodules with the largest over the right lower lobe measuring 7 mm. Mild posterior bibasilar airspace opacification likely atelectasis. No effusion. Moderate narrowing and possible focal obstruction of a few prox right lower lobar bronchi. Upper Abdomen: No acute abnormality. Musculoskeletal: Mild degenerate change of the spine. Mild depression of the superior endplate of an upper thoracic vertebral body age indeterminate. Review of the MIP images confirms the above findings. IMPRESSION: Patchy bilateral reticulonodular and tree-in-bud pattern of opacification worse over the lower lobes with a few subcentimeter scattered cavitary nodules. Bibasilar dependent atelectasis. Findings may be due to bacterial infection, atypical infection such as MAC or inflammatory process. Recommend followup CT 6 weeks. No evidence of pulmonary embolism. 1.1 cm lymph node over the superior mediastinum abutting the right lateral aspect of the upper esophagus unchanged from 2015. Mild anterior wedging of an upper thoracic vertebral body with depression of the superior endplate age indeterminate, but likely chronic. Electronically Signed   By: Marin Olp M.D.   On: 02/21/2017 19:08   Dg Chest Port 1 View  Result Date: 02/22/2017 CLINICAL DATA:  Acute respiratory failure. EXAM: PORTABLE CHEST 1 VIEW COMPARISON:  02/21/2017. FINDINGS: Normal heart size. BILATERAL pulmonary opacities, RIGHT greater than LEFT, better visualized on CT, appears stable. No significant effusion or pneumothorax. IMPRESSION: Stable aeration. BILATERAL pulmonary opacities, better  visualized on CT. Electronically Signed   By: Staci Righter M.D.   On: 02/22/2017 07:00   Dg Chest Portable 1 View  Result Date: 02/21/2017 CLINICAL DATA:  Shortness of breath and hypoxia EXAM: PORTABLE CHEST 1 VIEW COMPARISON:  08/19/2015 FINDINGS: Linear atelectasis at the right lung base. No large effusion. Mild diffuse interstitial prominence. Normal cardiomediastinal silhouette with atherosclerosis. No pneumothorax IMPRESSION: 1. Mild diffuse interstitial prominence could relate to mild edema. 2. Streaky atelectasis at the right lung base. Electronically Signed   By: Donavan Foil M.D.   On: 02/21/2017 17:59   ASSESSMENT AND PLAN:   Acute respiratory failure with hypoxia due to bilateral pneumonia, CAP and COPD exacerbation. continue Levaquin IV, follow-up blood culture and sputum culture,  NEB as needed.  Per pulmonary consult, prednisone, nebulizer as needed, Dulera and Spiriva.  Elevated troponin, demanding ischemia due to above.  Continue Xarelto.  Hypokalemia.  Given potassium supplement and follow-up level. History of DVT.  Resumed Xarelto. Diabetes.  on sliding scale. Tobacco abuse.  Smoking cessation was counseled for 4 minutes.  Nicotine patch.  All the records are reviewed and case discussed with Care Management/Social Worker. Management plans discussed with the patient, family and they are in agreement.  CODE STATUS: Full Code  TOTAL TIME TAKING CARE OF THIS PATIENT: 36 minutes.   More than 50% of the time was spent in counseling/coordination of care: YES  POSSIBLE D/C IN 2-3 DAYS, DEPENDING ON CLINICAL CONDITION.   Demetrios Loll M.D on 02/22/2017 at 9:57 AM  Between 7am to 6pm - Pager - [phone]  After 6pm go to [email or web address] - [facility]

## 2017-02-22 NOTE — Plan of Care (Signed)
VSS, free of falls during shift.  Denies pain.  No needs overnight.  Flu negative.  Bed in low position, bed alarm on.  Call bell within reach.  WCTM.

## 2017-02-23 LAB — GLUCOSE, CAPILLARY
Glucose-Capillary: 97 mg/dL (ref 65–99)
Glucose-Capillary: 133 mg/dL — ABNORMAL HIGH (ref 65–99)

## 2017-02-23 LAB — BASIC METABOLIC PANEL
Anion gap: 7 (ref 5–15)
BUN: 20 mg/dL (ref 6–20)
CHLORIDE: 105 mmol/L (ref 101–111)
CO2: 27 mmol/L (ref 22–32)
CREATININE: 0.74 mg/dL (ref 0.44–1.00)
Calcium: 9 mg/dL (ref 8.9–10.3)
Glucose, Bld: 127 mg/dL — ABNORMAL HIGH (ref 65–99)
POTASSIUM: 3.5 mmol/L (ref 3.5–5.1)
SODIUM: 139 mmol/L (ref 135–145)

## 2017-02-23 LAB — HIV ANTIBODY (ROUTINE TESTING W REFLEX): HIV SCREEN 4TH GENERATION: NONREACTIVE

## 2017-02-23 MED ORDER — PREDNISONE 20 MG PO TABS: 40.0000 mg | ORAL_TABLET | Freq: Every day | ORAL | 0 refills | Status: DC

## 2017-02-23 MED ORDER — GUAIFENESIN-DM 100-10 MG/5ML PO SYRP: 5.0000 mL | ORAL_SOLUTION | ORAL | 0 refills | Status: DC | PRN

## 2017-02-23 MED ORDER — LEVOFLOXACIN 750 MG PO TABS
750.0000 mg | ORAL_TABLET | Freq: Every day | ORAL | Status: DC
  Administered 2017-02-23: 12:00:00 750 mg via ORAL
  Filled 2017-02-23: qty 1

## 2017-02-23 MED ORDER — LEVOFLOXACIN 750 MG PO TABS: 750.0000 mg | ORAL_TABLET | Freq: Every day | ORAL | 0 refills | Status: DC

## 2017-02-23 NOTE — Discharge Instructions (Signed)
Need home O2 [place] 2L. Smoking cessation
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
## 2017-02-23 NOTE — Consult Note (Signed)
Crcl now >50 ml/min. Will change levofloxacin to 750mg  q 24hr. Will also change to po since pt is afebrile WBC <11 and pt is taking other po meds.  Olene FlossMelissa D Maccia, Pharm.D, BCPS Clinical Pharmacist

## 2017-02-23 NOTE — Care Management Important Message (Signed)
Important Message  Patient Details  Name: Yvette Martinez MRN: [id number] Date of Birth: 15-Jul-1950   Medicare Important Message Given:  Yes    Gwenette GreetBrenda S Sheritha Louis, RN 02/23/2017, 1:05 PM

## 2017-02-23 NOTE — Progress Notes (Signed)
SATURATION QUALIFICATIONS: (This note is used to comply with regulatory documentation for home oxygen)  Patient Saturations on Room Air at Rest = *88%  Patient Saturations on Room Air while Ambulating = *85%  Patient Saturations on **2 Liters of oxygen while Ambulating = *94%  Please briefly explain why patient needs home oxygen: 

## 2017-02-23 NOTE — Progress Notes (Signed)
Spoke with patient about xarelto and why pt discontinued it. Pt states that she stopped taking it bc it was expensive and because she saw on facebook about law suites and people dying for taking it. Explained to patient about the risks and benefits of medication. Pt says her Dr Hematologist told her she would be on it for only 6 months. Pt was hospitalized in May - did receive chemical DVT prophylaxis but not SCD. Developed DVT. It has been > 6 months since DVT. Pt never followed up with hematology clinic after intial appointment. Pt was encouraged to call clinic, explain that she only took 2 months of medication and ask if she should continue to take or if it is ok to remain off med. Pt concerned over price, however she said the Dr said they could "help her out"  (most likely samples). She is in the donut whole, but will not be as of the new year. Pt encouraged to follow up with dr and express cost concerns if dr wants her to continue.  Expect poor compliance  Aladdin Kollmann D Richardo Popoff, Pharm.D, BCPS Clinical Pharmacist

## 2017-02-23 NOTE — Plan of Care (Signed)
VSS from previous VS check.  Free of falls during shift.  Denies pain.  Received requested PRN Xanax 1mg  for anxiety.  No other needs overnight.  Bed in low position, call bell within reach.  WCTM.

## 2017-02-23 NOTE — Care Management Note (Signed)
Case Management Note  Patient Details  Name: Yvette Martinez MRN: [id number] Date of Birth: 1950/07/01  Subjective/Objective:   Admitted to [facility] with the diagnosis of acute respiratory failure. Granddaughter Mardella Layman(Lindsey) and 4 children are in the home. Sister is Delfino LovettLinda Crawford [phone]) [facility]/20/16. Prescriptions are filled at [facility] [place]. [facility] in the past. Doesn't remember name of agency. No skilled facility. No home oxygen. Rolling walker (2) and nebulizer in the home. No Life Alert. Takes care of all basic activities of daily living herself, drives. Fell last week. Good appetite when not sick.  Action/Plan: Referral for medication assistance. States she is in the donut hole. Application for medication management given. Does qualify for home oxygen. Will update Barbara CowerJason, [facility] representative. COPD program per [facility]    Expected Discharge Date:  02/23/17               Expected Discharge Plan:     In-House Referral:   yes  Discharge planning Services     Post Acute Care Choice:   yes Choice offered to:     DME Arranged:   yes DME Agency:   [facility]  HH Arranged:   yes HH Agency:   [facility]   Status of Service:     If discussed at Long Length of Stay Meetings, dates discussed:    Additional Comments:  Gwenette GreetBrenda S Ramar Nobrega, RN MSN CCM Care management [phone] 02/23/2017, 12:20 PM

## 2017-02-23 NOTE — Discharge Summary (Signed)
[facility] [place] [facility]   PATIENT NAME: Yvette Martinez    MR#:  [id number]  DATE OF BIRTH:  February 04, 1951  DATE OF ADMISSION:  02/21/2017   ADMITTING PHYSICIAN: Shaune PollackQing Cydney Alvarenga, MD  DATE OF DISCHARGE: No discharge date for patient encounter.  PRIMARY CARE PHYSICIAN: [facility]   ADMISSION DIAGNOSIS:  Acute respiratory failure with hypoxia (HCC) [J96.01] Demand ischemia of myocardium (HCC) [I24.8] Community acquired pneumonia, unspecified laterality [J18.9] DISCHARGE DIAGNOSIS:  Active Problems:   Acute respiratory failure with hypoxia (HCC)  SECONDARY DIAGNOSIS:   Past Medical History:  Diagnosis Date  . COPD (chronic obstructive pulmonary disease) (HCC)   . Diabetes mellitus without complication (HCC)   . Diverticula of colon   . DVT (deep vein thrombosis) in pregnancy (HCC)   . Pneumonia    HOSPITAL COURSE:   Acute respiratory failure with hypoxia due to bilateral pneumonia, CAP and COPD exacerbation. continue Levaquin po for 5 more days, follow-up blood culture are negative.  NEBas needed. prednisone, nebulizer as needed, Dulera and Spiriva. Per Dr. Bard HerbertSimmonds, Complete 5-7 days abx - either levofloxacin or doxycycline are reasonable choices. Complete 5 days of prednisone (40 mg daily. No taper should be needed), Repeat CT chest in 6 weeks - if findings persist and are not improving, reconsult Pulmonary medicine for bronchoscopy with TBBx The patient has hypoxia without oxygen.  She will need home oxygen 2 L.  Elevated troponin, demanding ischemia due to above. Continue Xarelto.  Hypokalemia. Given potassium supplement andimproved. History of DVT. Resumed Xarelto. Diabetes. on sliding scale. Tobacco abuse. Smoking cessation was counseled for 4 minutes. Nicotine patch Discussed with Dr. Sung AmabileSimonds. DISCHARGE CONDITIONS:  Stable, discharge to home today. CONSULTS OBTAINED:  Treatment Team:  DeterdingDorise Hiss, Elizabeth C, MD DRUG  ALLERGIES:   Allergies  Allergen Reactions  . Penicillins Rash  . Venlafaxine Swelling  . Sulfa Antibiotics Hives   DISCHARGE MEDICATIONS:   Allergies as of 02/23/2017      Reactions   Penicillins Rash   Venlafaxine Swelling   Sulfa Antibiotics Hives      Medication List    TAKE these medications   ADVAIR DISKUS 100-50 MCG/DOSE Aepb Generic drug:  Fluticasone-Salmeterol Inhale 1 puff into the lungs 2 (two) times daily.   ALPRAZolam 1 MG tablet Commonly known as:  XANAX Take 1 tablet by mouth every 6 (six) hours as needed for anxiety.   bisacodyl 10 MG suppository Commonly known as:  DULCOLAX Place 1 suppository (10 mg total) rectally as needed for moderate constipation.   guaiFENesin-dextromethorphan 100-10 MG/5ML syrup Commonly known as:  ROBITUSSIN DM Take 5 mLs by mouth every 4 (four) hours as needed for cough.   ibuprofen 200 MG tablet Commonly known as:  ADVIL,MOTRIN Take 200-400 mg by mouth every 6 (six) hours as needed.   levofloxacin 750 MG tablet Commonly known as:  LEVAQUIN Take 1 tablet (750 mg total) by mouth daily.   mirtazapine 30 MG tablet Commonly known as:  REMERON Take 30 mg by mouth 2 (two) times daily.   montelukast 10 MG tablet Commonly known as:  SINGULAIR Take 10 mg by mouth daily.   predniSONE 20 MG tablet Commonly known as:  DELTASONE Take 2 tablets (40 mg total) by mouth daily with breakfast. Start taking on:  02/24/2017   rivaroxaban 20 MG Tabs tablet Commonly known as:  XARELTO Take 20 mg by mouth daily with supper.   tiotropium 18 MCG inhalation capsule Commonly known as:  SPIRIVA Place  1 capsule into inhaler and inhale daily.   VENTOLIN HFA 108 (90 Base) MCG/ACT inhaler Generic drug:  albuterol Inhale 1-2 puffs into the lungs every 6 (six) hours as needed.   albuterol (2.5 MG/3ML) 0.083% nebulizer solution Commonly known as:  PROVENTIL Take 3 mLs (2.5 mg total) by nebulization every 4 (four) hours as needed for  wheezing or shortness of breath.            Durable Medical Equipment  (From admission, onward)        Start     Ordered   02/23/17 1321  For home use only DME oxygen  Once    Question Answer Comment  Mode or (Route) Nasal cannula   Frequency Continuous (stationary and portable oxygen unit needed)   Oxygen conserving device Yes   Oxygen delivery system Gas      02/23/17 1320       DISCHARGE INSTRUCTIONS:  See AVS.  If you experience worsening of your admission symptoms, develop shortness of breath, life threatening emergency, suicidal or homicidal thoughts you must seek medical attention immediately by calling 911 or calling your MD immediately  if symptoms less severe.  You Must read complete instructions/literature along with all the possible adverse reactions/side effects for all the Medicines you take and that have been prescribed to you. Take any new Medicines after you have completely understood and accpet all the possible adverse reactions/side effects.   Please note  You were cared for by a hospitalist during your hospital stay. If you have any questions about your discharge medications or the care you received while you were in the hospital after you are discharged, you can call the unit and asked to speak with the hospitalist on call if the hospitalist that took care of you is not available. Once you are discharged, your primary care physician will handle any further medical issues. Please note that NO REFILLS for any discharge medications will be authorized once you are discharged, as it is imperative that you return to your primary care physician (or establish a relationship with a primary care physician if you do not have one) for your aftercare needs so that they can reassess your need for medications and monitor your lab values.    On the day of Discharge:  VITAL SIGNS:  Blood pressure (!) 122/46, pulse 79, temperature 98.1 F (36.7 C), temperature source Oral,  resp. rate 18, height 5\' 1"  (1.549 m), weight 122 lb 1.6 oz (55.4 kg), SpO2 96 %. PHYSICAL EXAMINATION:  GENERAL:  66 y.o.-year-old patient lying in the bed with no acute distress.  EYES: Pupils equal, round, reactive to light and accommodation. No scleral icterus. Extraocular muscles intact.  HEENT: Head atraumatic, normocephalic. Oropharynx and nasopharynx clear.  NECK:  Supple, no jugular venous distention. No thyroid enlargement, no tenderness.  LUNGS: Normal breath sounds bilaterally, mild wheezing, no rales,rhonchi or crepitation. No use of accessory muscles of respiration.  CARDIOVASCULAR: S1, S2 normal. No murmurs, rubs, or gallops.  ABDOMEN: Soft, non-tender, non-distended. Bowel sounds present. No organomegaly or mass.  EXTREMITIES: No pedal edema, cyanosis, or clubbing.  NEUROLOGIC: Cranial nerves II through XII are intact. Muscle strength 5/5 in all extremities. Sensation intact. Gait not checked.  PSYCHIATRIC: The patient is alert and oriented x 3.  SKIN: No obvious rash, lesion, or ulcer.  DATA REVIEW:   CBC Recent Labs  Lab 02/22/17 0446  WBC 6.4  HGB 13.8  HCT 40.7  PLT 164    Chemistries  Recent Labs  Lab 02/21/17 2207  02/23/17 0417  NA  --    < > 139  K  --    < > 3.5  CL  --    < > 105  CO2  --    < > 27  GLUCOSE  --    < > 127*  BUN  --    < > 20  CREATININE  --    < > 0.74  CALCIUM  --    < > 9.0  MG 2.3  --   --    < > = values in this interval not displayed.     Microbiology Results  Results for orders placed or performed during the hospital encounter of 02/21/17  Blood culture (routine x 2)     Status: None (Preliminary result)   Collection Time: 02/21/17  5:38 PM  Result Value Ref Range Status   Specimen Description BLOOD RIGHT ANTECUBITAL  Final   Special Requests   Final    BOTTLES DRAWN AEROBIC AND ANAEROBIC Blood Culture adequate volume   Culture   Final    NO GROWTH 2 DAYS Performed at [facility], [address],  [place], [place] [address]    Report Status PENDING  Incomplete  Blood culture (routine x 2)     Status: None (Preliminary result)   Collection Time: 02/21/17  5:38 PM  Result Value Ref Range Status   Specimen Description BLOOD LEFT ANTECUBITAL  Final   Special Requests   Final    BOTTLES DRAWN AEROBIC AND ANAEROBIC Blood Culture adequate volume   Culture   Final    NO GROWTH 2 DAYS Performed at [facility], [address] [place], [place] [address]    Report Status PENDING  Incomplete  Culture, sputum-assessment     Status: None   Collection Time: 02/22/17  2:45 AM  Result Value Ref Range Status   Specimen Description EXPECTORATED SPUTUM  Final   Special Requests NONE  Final   Sputum evaluation   Final    THIS SPECIMEN IS ACCEPTABLE FOR SPUTUM CULTURE Performed at [facility], [address], [place], [place] [address]    Report Status 02/22/2017 FINAL  Final  Culture, respiratory (NON-Expectorated)     Status: None (Preliminary result)   Collection Time: 02/22/17  2:45 AM  Result Value Ref Range Status   Specimen Description   Final    EXPECTORATED SPUTUM Performed at [facility], [address] [place], [place] [address]    Special Requests   Final    NONE Reflexed from [id number] Performed at [facility], [address] [place], [place] [address]    Gram Stain   Final    ABUNDANT WBC PRESENT, PREDOMINANTLY PMN RARE SQUAMOUS EPITHELIAL CELLS PRESENT MODERATE GRAM NEGATIVE COCCOBACILLI FEW GRAM POSITIVE RODS FEW YEAST    Culture   Final    CULTURE REINCUBATED FOR BETTER GROWTH Performed at [facility], [address], [place], [place] [address]    Report Status PENDING  Incomplete    RADIOLOGY:  No results found.   Management plans discussed with the patient, family and they are in agreement.  CODE STATUS: Full Code   TOTAL TIME TAKING CARE OF THIS PATIENT: 36 minutes.    Shaune Pollack M.D on 02/23/2017 at 1:21  PM  Between 7am to 6pm - Pager - [phone]  After 6pm go to [email or web address] - Therapist, nutritional  Hospitalists  Office  [phone]  CC: Primary care physician; [facility]   Note: This dictation was prepared with Dragon dictation along with smaller phrase technology. Any transcriptional errors that result from this process are unintentional.

## 2017-02-24 LAB — CULTURE, RESPIRATORY W GRAM STAIN

## 2017-02-24 LAB — CULTURE, RESPIRATORY

## 2017-02-25 LAB — BLOOD GAS, VENOUS
Acid-Base Excess: 3.2 mmol/L — ABNORMAL HIGH (ref 0.0–2.0)
BICARBONATE: 30.4 mmol/L — AB (ref 20.0–28.0)
PCO2 VEN: 55 mmHg (ref 44.0–60.0)
Patient temperature: 37
pH, Ven: 7.35 (ref 7.250–7.430)

## 2017-02-26 LAB — CULTURE, BLOOD (ROUTINE X 2)
CULTURE: NO GROWTH
CULTURE: NO GROWTH
SPECIAL REQUESTS: ADEQUATE
Special Requests: ADEQUATE

## 2017-06-02 ENCOUNTER — Other Ambulatory Visit: Payer: Self-pay | Admitting: Specialist

## 2017-06-02 DIAGNOSIS — R058 Other specified cough: Secondary | ICD-10-CM

## 2017-06-02 DIAGNOSIS — R05 Cough: Secondary | ICD-10-CM

## 2017-06-02 DIAGNOSIS — J984 Other disorders of lung: Secondary | ICD-10-CM

## 2017-06-02 DIAGNOSIS — R0602 Shortness of breath: Secondary | ICD-10-CM

## 2017-06-08 ENCOUNTER — Ambulatory Visit
Admission: RE | Admit: 2017-06-08 | Discharge: 2017-06-08 | Disposition: A | Discharge status: Home / Self Care | Payer: Medicare HMO | Source: Ambulatory Visit | Attending: Specialist | Admitting: Specialist

## 2017-06-08 DIAGNOSIS — R918 Other nonspecific abnormal finding of lung field: Secondary | ICD-10-CM | POA: Insufficient documentation

## 2017-06-08 DIAGNOSIS — I700 Atherosclerosis of aorta: Secondary | ICD-10-CM | POA: Diagnosis not present

## 2017-06-08 DIAGNOSIS — J479 Bronchiectasis, uncomplicated: Secondary | ICD-10-CM | POA: Insufficient documentation

## 2017-06-08 DIAGNOSIS — R0602 Shortness of breath: Secondary | ICD-10-CM

## 2017-06-08 DIAGNOSIS — J984 Other disorders of lung: Secondary | ICD-10-CM

## 2017-06-08 DIAGNOSIS — I2510 Atherosclerotic heart disease of native coronary artery without angina pectoris: Secondary | ICD-10-CM | POA: Insufficient documentation

## 2017-06-08 DIAGNOSIS — R05 Cough: Secondary | ICD-10-CM | POA: Insufficient documentation

## 2017-06-08 DIAGNOSIS — R058 Other specified cough: Secondary | ICD-10-CM

## 2017-06-08 DIAGNOSIS — J849 Interstitial pulmonary disease, unspecified: Secondary | ICD-10-CM | POA: Diagnosis present

## 2017-12-30 ENCOUNTER — Emergency Department: Payer: Medicare HMO

## 2017-12-30 ENCOUNTER — Inpatient Hospital Stay
Admission: EM | Admit: 2017-12-30 | Discharge: 2018-01-29 | DRG: 329 | Disposition: E | Payer: Medicare HMO | Attending: Internal Medicine | Admitting: Internal Medicine

## 2017-12-30 ENCOUNTER — Other Ambulatory Visit: Payer: Self-pay

## 2017-12-30 ENCOUNTER — Encounter: Payer: Self-pay | Admitting: Emergency Medicine

## 2017-12-30 DIAGNOSIS — I2699 Other pulmonary embolism without acute cor pulmonale: Secondary | ICD-10-CM | POA: Diagnosis present

## 2017-12-30 DIAGNOSIS — Z66 Do not resuscitate: Secondary | ICD-10-CM | POA: Diagnosis not present

## 2017-12-30 DIAGNOSIS — J9601 Acute respiratory failure with hypoxia: Secondary | ICD-10-CM | POA: Diagnosis not present

## 2017-12-30 DIAGNOSIS — R140 Abdominal distension (gaseous): Secondary | ICD-10-CM | POA: Diagnosis not present

## 2017-12-30 DIAGNOSIS — A419 Sepsis, unspecified organism: Secondary | ICD-10-CM | POA: Diagnosis not present

## 2017-12-30 DIAGNOSIS — Z79899 Other long term (current) drug therapy: Secondary | ICD-10-CM

## 2017-12-30 DIAGNOSIS — R609 Edema, unspecified: Secondary | ICD-10-CM

## 2017-12-30 DIAGNOSIS — F329 Major depressive disorder, single episode, unspecified: Secondary | ICD-10-CM | POA: Diagnosis present

## 2017-12-30 DIAGNOSIS — Z515 Encounter for palliative care: Secondary | ICD-10-CM | POA: Diagnosis not present

## 2017-12-30 DIAGNOSIS — R9431 Abnormal electrocardiogram [ECG] [EKG]: Secondary | ICD-10-CM | POA: Diagnosis not present

## 2017-12-30 DIAGNOSIS — Z8249 Family history of ischemic heart disease and other diseases of the circulatory system: Secondary | ICD-10-CM | POA: Diagnosis not present

## 2017-12-30 DIAGNOSIS — Z809 Family history of malignant neoplasm, unspecified: Secondary | ICD-10-CM

## 2017-12-30 DIAGNOSIS — R6521 Severe sepsis with septic shock: Secondary | ICD-10-CM | POA: Diagnosis not present

## 2017-12-30 DIAGNOSIS — J449 Chronic obstructive pulmonary disease, unspecified: Secondary | ICD-10-CM | POA: Diagnosis not present

## 2017-12-30 DIAGNOSIS — Z978 Presence of other specified devices: Secondary | ICD-10-CM

## 2017-12-30 DIAGNOSIS — S35516A Injury of unspecified iliac vein, initial encounter: Secondary | ICD-10-CM | POA: Diagnosis not present

## 2017-12-30 DIAGNOSIS — E119 Type 2 diabetes mellitus without complications: Secondary | ICD-10-CM | POA: Diagnosis present

## 2017-12-30 DIAGNOSIS — J9602 Acute respiratory failure with hypercapnia: Secondary | ICD-10-CM | POA: Diagnosis not present

## 2017-12-30 DIAGNOSIS — E43 Unspecified severe protein-calorie malnutrition: Secondary | ICD-10-CM | POA: Diagnosis present

## 2017-12-30 DIAGNOSIS — Z833 Family history of diabetes mellitus: Secondary | ICD-10-CM

## 2017-12-30 DIAGNOSIS — Z452 Encounter for adjustment and management of vascular access device: Secondary | ICD-10-CM

## 2017-12-30 DIAGNOSIS — Z7951 Long term (current) use of inhaled steroids: Secondary | ICD-10-CM

## 2017-12-30 DIAGNOSIS — I82413 Acute embolism and thrombosis of femoral vein, bilateral: Secondary | ICD-10-CM | POA: Diagnosis not present

## 2017-12-30 DIAGNOSIS — K5652 Intestinal adhesions [bands] with complete obstruction: Principal | ICD-10-CM | POA: Diagnosis present

## 2017-12-30 DIAGNOSIS — K56609 Unspecified intestinal obstruction, unspecified as to partial versus complete obstruction: Secondary | ICD-10-CM | POA: Diagnosis not present

## 2017-12-30 DIAGNOSIS — F419 Anxiety disorder, unspecified: Secondary | ICD-10-CM | POA: Diagnosis present

## 2017-12-30 DIAGNOSIS — Z881 Allergy status to other antibiotic agents status: Secondary | ICD-10-CM

## 2017-12-30 DIAGNOSIS — Z882 Allergy status to sulfonamides status: Secondary | ICD-10-CM

## 2017-12-30 DIAGNOSIS — I745 Embolism and thrombosis of iliac artery: Secondary | ICD-10-CM | POA: Diagnosis not present

## 2017-12-30 DIAGNOSIS — F17210 Nicotine dependence, cigarettes, uncomplicated: Secondary | ICD-10-CM | POA: Diagnosis present

## 2017-12-30 DIAGNOSIS — I82403 Acute embolism and thrombosis of unspecified deep veins of lower extremity, bilateral: Secondary | ICD-10-CM | POA: Diagnosis not present

## 2017-12-30 DIAGNOSIS — Z880 Allergy status to penicillin: Secondary | ICD-10-CM

## 2017-12-30 DIAGNOSIS — Z90710 Acquired absence of both cervix and uterus: Secondary | ICD-10-CM

## 2017-12-30 DIAGNOSIS — Z86718 Personal history of other venous thrombosis and embolism: Secondary | ICD-10-CM

## 2017-12-30 DIAGNOSIS — K5730 Diverticulosis of large intestine without perforation or abscess without bleeding: Secondary | ICD-10-CM | POA: Diagnosis present

## 2017-12-30 DIAGNOSIS — Z7901 Long term (current) use of anticoagulants: Secondary | ICD-10-CM | POA: Diagnosis not present

## 2017-12-30 DIAGNOSIS — K56691 Other complete intestinal obstruction: Secondary | ICD-10-CM | POA: Diagnosis not present

## 2017-12-30 DIAGNOSIS — Z6821 Body mass index (BMI) 21.0-21.9, adult: Secondary | ICD-10-CM

## 2017-12-30 DIAGNOSIS — E876 Hypokalemia: Secondary | ICD-10-CM | POA: Diagnosis present

## 2017-12-30 LAB — CBC WITH DIFFERENTIAL/PLATELET
Abs Immature Granulocytes: 0.11 10*3/uL — ABNORMAL HIGH (ref 0.00–0.07)
BASOS ABS: 0 10*3/uL (ref 0.0–0.1)
Basophils Relative: 0 %
Eosinophils Absolute: 0.1 10*3/uL (ref 0.0–0.5)
Eosinophils Relative: 1 %
HEMATOCRIT: 43.1 % (ref 36.0–46.0)
Hemoglobin: 15.1 g/dL — ABNORMAL HIGH (ref 12.0–15.0)
IMMATURE GRANULOCYTES: 1 %
LYMPHS ABS: 1.2 10*3/uL (ref 0.7–4.0)
Lymphocytes Relative: 9 %
MCH: 32.6 pg (ref 26.0–34.0)
MCHC: 35 g/dL (ref 30.0–36.0)
MCV: 93.1 fL (ref 80.0–100.0)
MONOS PCT: 5 %
Monocytes Absolute: 0.7 10*3/uL (ref 0.1–1.0)
NRBC: 0 % (ref 0.0–0.2)
Neutro Abs: 10.7 10*3/uL — ABNORMAL HIGH (ref 1.7–7.7)
Neutrophils Relative %: 84 %
Platelets: 225 10*3/uL (ref 150–400)
RBC: 4.63 MIL/uL (ref 3.87–5.11)
RDW: 13.5 % (ref 11.5–15.5)
WBC: 12.9 10*3/uL — ABNORMAL HIGH (ref 4.0–10.5)

## 2017-12-30 LAB — COMPREHENSIVE METABOLIC PANEL
ALT: 28 U/L (ref 0–44)
ANION GAP: 15 (ref 5–15)
AST: 40 U/L (ref 15–41)
Albumin: 2.8 g/dL — ABNORMAL LOW (ref 3.5–5.0)
Alkaline Phosphatase: 116 U/L (ref 38–126)
BILIRUBIN TOTAL: 1.6 mg/dL — AB (ref 0.3–1.2)
BUN: 19 mg/dL (ref 8–23)
CO2: 35 mmol/L — ABNORMAL HIGH (ref 22–32)
Calcium: 7.7 mg/dL — ABNORMAL LOW (ref 8.9–10.3)
Chloride: 87 mmol/L — ABNORMAL LOW (ref 98–111)
Creatinine, Ser: 0.84 mg/dL (ref 0.44–1.00)
GFR calc non Af Amer: >60 mL/min (ref >60)
Glucose, Bld: 98 mg/dL (ref 70–99)
Potassium: <2 mmol/L — CL (ref 3.5–5.1)
SODIUM: 137 mmol/L (ref 135–145)
TOTAL PROTEIN: 5.2 g/dL — AB (ref 6.5–8.1)

## 2017-12-30 LAB — TROPONIN I: TROPONIN I: 0.04 ng/mL — AB (ref <0.03)

## 2017-12-30 LAB — BRAIN NATRIURETIC PEPTIDE: B Natriuretic Peptide: 94 pg/mL (ref 0.0–100.0)

## 2017-12-30 LAB — MAGNESIUM: Magnesium: 1.5 mg/dL — ABNORMAL LOW (ref 1.7–2.4)

## 2017-12-30 MED ORDER — MAGNESIUM SULFATE 2 GM/50ML IV SOLN
2.0000 g | Freq: Once | INTRAVENOUS | Status: AC
  Administered 2017-12-30: 2 g via INTRAVENOUS
  Filled 2017-12-30: qty 50

## 2017-12-30 MED ORDER — IOHEXOL 300 MG/ML  SOLN
100.0000 mL | Freq: Once | INTRAMUSCULAR | Status: AC | PRN
  Administered 2017-12-30: 75 mL via INTRAVENOUS

## 2017-12-30 MED ORDER — POTASSIUM CHLORIDE 10 MEQ/100ML IV SOLN
10.0000 meq | INTRAVENOUS | Status: AC
  Administered 2017-12-31 (×3): 10 meq via INTRAVENOUS
  Filled 2017-12-30 (×8): qty 100

## 2017-12-30 NOTE — H&P (Signed)
[facility] [place] [facility]   PATIENT NAME: Yvette Martinez    MR#:  [id number]  DATE OF BIRTH:  Feb 10, 1951  DATE OF ADMISSION:  01/17/2018  PRIMARY CARE PHYSICIAN: [facility]   REQUESTING/REFERRING PHYSICIAN: Don Perking, MD  CHIEF COMPLAINT:   Chief Complaint  Patient presents with  . Leg Swelling  . abdominal distention    HISTORY OF PRESENT ILLNESS:  Yvette Martinez  is a 67 y.o. female who presents with chief complaint as above.  Patient presents to the ED tonight with significant bilateral lower extremity edema as well as significant abdominal distention.  Patient states that her abdomen has been growing more distended over the last 4 weeks and she is only had one bowel movement in that time.  Work-up here in the ED shows likely obstructive process in her colon.  Hospitalist were called for admission  PAST MEDICAL HISTORY:   Past Medical History:  Diagnosis Date  . COPD (chronic obstructive pulmonary disease) (HCC)   . Diabetes mellitus without complication (HCC)   . Diverticula of colon   . DVT (deep vein thrombosis) in pregnancy   . Pneumonia      PAST SURGICAL HISTORY:   Past Surgical History:  Procedure Laterality Date  . ABDOMINAL HYSTERECTOMY    . CYSTECTOMY     patient states beind her bladder  . TONSILLECTOMY       SOCIAL HISTORY:   Social History   Tobacco Use  . Smoking status: Current Every Day Smoker    Packs/day: 0.30    Types: Cigarettes  . Smokeless tobacco: Never Used  Substance Use Topics  . Alcohol use: No    Frequency: Never     FAMILY HISTORY:   Family History  Problem Relation Age of Onset  . CAD Father   . Aneurysm Father   . Diabetes Brother   . Cancer Sister   . Kidney cancer Neg Hx   . Bladder Cancer Neg Hx      DRUG ALLERGIES:   Allergies  Allergen Reactions  . Penicillins Rash  . Venlafaxine Swelling  . Sulfa Antibiotics Hives    MEDICATIONS AT HOME:   Prior to  Admission medications   Medication Sig Start Date End Date Taking? Authorizing Provider  ALPRAZolam Prudy Feeler) 1 MG tablet Take 1 tablet by mouth every 6 (six) hours as needed for anxiety.    Yes [provider]  Fluticasone-Salmeterol (ADVAIR DISKUS) 100-50 MCG/DOSE AEPB Inhale 1 puff into the lungs 2 (two) times daily. 06/28/14  Yes [provider]  mirtazapine (REMERON) 30 MG tablet Take 30 mg by mouth 2 (two) times daily.    Yes [provider]  tiotropium (SPIRIVA) 18 MCG inhalation capsule Place 1 capsule into inhaler and inhale daily. 06/28/14  Yes [provider]  VENTOLIN HFA 108 (90 Base) MCG/ACT inhaler Inhale 1-2 puffs into the lungs every 6 (six) hours as needed for wheezing or shortness of breath.  03/26/15  Yes [provider]  albuterol (PROVENTIL) (2.5 MG/3ML) 0.083% nebulizer solution Take 3 mLs (2.5 mg total) by nebulization every 4 (four) hours as needed for wheezing or shortness of breath. Patient not taking: Reported on 02/21/2017 08/23/15   Katharina Caper, MD  bisacodyl (DULCOLAX) 10 MG suppository Place 1 suppository (10 mg total) rectally as needed for moderate constipation. Patient not taking: Reported on 01/15/2018 04/17/15   Emily Filbert, MD  guaiFENesin-dextromethorphan [facility] DM) 100-10 MG/5ML syrup Take 5 mLs by mouth every  4 (four) hours as needed for cough. Patient not taking: Reported on 01/14/2018 02/23/17   Shaune Pollack, MD  ibuprofen (ADVIL,MOTRIN) 200 MG tablet Take 200-400 mg by mouth every 6 (six) hours as needed.     [provider]  levofloxacin (LEVAQUIN) 750 MG tablet Take 1 tablet (750 mg total) by mouth daily. Patient not taking: Reported on 01/26/2018 02/23/17   Shaune Pollack, MD  predniSONE (DELTASONE) 20 MG tablet Take 2 tablets (40 mg total) by mouth daily with breakfast. Patient not taking: Reported on 12/31/2017 02/24/17   Shaune Pollack, MD  rivaroxaban (XARELTO) 20 MG TABS tablet Take 20 mg by mouth  daily with supper.    [provider]    REVIEW OF SYSTEMS:  Review of Systems  Constitutional: Negative for chills, fever, malaise/fatigue and weight loss.  HENT: Negative for ear pain, hearing loss and tinnitus.   Eyes: Negative for blurred vision, double vision, pain and redness.  Respiratory: Negative for cough, hemoptysis and shortness of breath.   Cardiovascular: Positive for leg swelling. Negative for chest pain, palpitations and orthopnea.  Gastrointestinal: Positive for abdominal pain. Negative for constipation, diarrhea, nausea and vomiting.       Distention  Genitourinary: Negative for dysuria, frequency and hematuria.  Musculoskeletal: Negative for back pain, joint pain and neck pain.  Skin:       No acne, rash, or lesions  Neurological: Negative for dizziness, tremors, focal weakness and weakness.  Endo/Heme/Allergies: Negative for polydipsia. Does not bruise/bleed easily.  Psychiatric/Behavioral: Negative for depression. The patient is not nervous/anxious and does not have insomnia.      VITAL SIGNS:   Vitals:   01/13/2018 2130 01/12/2018 2200 01/23/2018 2230 01/05/2018 2300  BP: 129/71 135/72 112/68 (!) 106/55  Pulse: 81 82 83 79  Resp: 14 15 17 15   Temp:      TempSrc:      SpO2: 96% 98% 93% 95%   Wt Readings from Last 3 Encounters:  02/21/17 55.4 kg  08/26/16 59 kg  08/06/16 59.1 kg    PHYSICAL EXAMINATION:  Physical Exam  Vitals reviewed. Constitutional: She is oriented to person, place, and time. She appears well-developed and well-nourished. No distress.  HENT:  Head: Normocephalic and atraumatic.  Mouth/Throat: Oropharynx is clear and moist.  Eyes: Pupils are equal, round, and reactive to light. Conjunctivae and EOM are normal. No scleral icterus.  Neck: Normal range of motion. Neck supple. No JVD present. No thyromegaly present.  Cardiovascular: Normal rate, regular rhythm and intact distal pulses. Exam reveals no gallop and no friction rub.  No  murmur heard. Respiratory: Effort normal and breath sounds normal. No respiratory distress. She has no wheezes. She has no rales.  GI: Soft. She exhibits distension. There is tenderness.  Musculoskeletal: Normal range of motion. She exhibits edema.  No arthritis, no gout  Lymphadenopathy:    She has no cervical adenopathy.  Neurological: She is alert and oriented to person, place, and time. No cranial nerve deficit.  No dysarthria, no aphasia  Skin: Skin is warm and dry. No rash noted. No erythema.  Psychiatric: She has a normal mood and affect. Her behavior is normal. Judgment and thought content normal.    LABORATORY PANEL:   CBC Recent Labs  Lab 01/15/2018 2000  WBC 12.9*  HGB 15.1*  HCT 43.1  PLT 225   ------------------------------------------------------------------------------------------------------------------  Chemistries  Recent Labs  Lab 01/20/2018 2036  NA 137  K <2.0*  CL 87*  CO2 35*  GLUCOSE  98  BUN 19  CREATININE 0.84  CALCIUM 7.7*  MG 1.5*  AST 40  ALT 28  ALKPHOS 116  BILITOT 1.6*   ------------------------------------------------------------------------------------------------------------------  Cardiac Enzymes Recent Labs  Lab 01/18/2018 2036  TROPONINI 0.04*   ------------------------------------------------------------------------------------------------------------------  RADIOLOGY:  Dg Chest 2 View  Result Date: 01/07/2018 CLINICAL DATA:  Bilateral lower extremity edema and abdominal distension. EXAM: CHEST - 2 VIEW COMPARISON:  Chest CT 06/08/2017 FINDINGS: Cardiomediastinal silhouette is normal. Mediastinal contours appear intact. Calcific atherosclerotic disease of the aorta. There is no evidence of focal airspace consolidation, pleural effusion or pneumothorax. Hyperinflation of the lungs. Mild bronchiectasis and peribronchial thickening, similar to the prior study. Osseous structures are without acute abnormality. Soft tissues are  grossly normal. IMPRESSION: Mild bronchiectasis and peribronchial thickening, similar to the prior study. Hyperinflation of the lungs. No evidence of acute lobar airspace consolidation or pulmonary edema. Electronically Signed   By: Ted Mcalpine M.D.   On: 01/28/2018 20:37   Ct Abdomen Pelvis W Contrast  Result Date: 01/12/2018 CLINICAL DATA:  67 year old female with abdominal pain. History of sigmoid diverticulitis. EXAM: CT ABDOMEN AND PELVIS WITH CONTRAST TECHNIQUE: Multidetector CT imaging of the abdomen and pelvis was performed using the standard protocol following bolus administration of intravenous contrast. CONTRAST:  75mL OMNIPAQUE IOHEXOL 300 MG/ML  SOLN COMPARISON:  Abdominal radiograph dated 01/10/2018 and CT dated 07/07/2016 FINDINGS: Lower chest: Right lung base scarring primarily involving the right middle lobe noted. The left lung base is clear. No intra-abdominal free air or free fluid. Hepatobiliary: No focal liver abnormality is seen. No gallstones, gallbladder wall thickening, or biliary dilatation. Pancreas: Unremarkable. No pancreatic ductal dilatation or surrounding inflammatory changes. Spleen: Normal in size without focal abnormality. Adrenals/Urinary Tract: Adrenal glands are unremarkable. Kidneys are normal, without renal calculi, focal lesion, or hydronephrosis. Bladder is unremarkable. Stomach/Bowel: There is diffuse severe distention of the colon with gas and semi-solid fecal matter. The transverse colon measure up to 9 cm in diameter. The distal sigmoid and rectum are collapsed. There is a transition zone in the sigmoid colon (series 2, image 64 and coronal series 5, image 45) in the area of prior diverticulitis. Findings likely represent an obstruction secondary to stricture or adhesion, related to prior inflammatory process, although a mass is not excluded. A pseudo-obstruction or adynamic ileus are other consideration. There is no evidence of colonic twisting. There is  sigmoid diverticulosis without active inflammatory changes. There is mild air distention of the distal small bowel and terminal ileum. Mildly thickened appearance of the distal small bowel loops in the pelvis may be reactive or represent enteritis. No evidence of small-bowel obstruction. There is no pneumatosis at this time. The appendix is unremarkable. Vascular/Lymphatic: Advanced aortoiliac atherosclerotic disease. Apparent low attenuating content within the proximal right femoral vein (series 2, image 87) likely mixing artifact. O'clock is less likely. Duplex ultrasound may provide better evaluation if there is clinical concern for DVT. The SMV, splenic vein, and main portal vein are patent. No portal venous gas. There is no adenopathy. Reproductive: Hysterectomy. No pelvic mass. Other: There is midline vertical anterior pelvic wall incisional scar. Subcutaneous edema of the left thigh. Musculoskeletal: Osteopenia with degenerative changes of the spine. No acute osseous pathology. IMPRESSION: 1. Severe distention of the colon with transition at the sigmoid, possibly secondary to stricture and obstruction versus less likely a pseudo-obstruction or an ileus. Clinical correlation and surgical consult is advised. No pneumatosis or portal venous gas. 2. Mildly thickened appearance of the distal small  bowel loops in the pelvis may be reactive or represent enteritis. No evidence of small-bowel obstruction. Normal appendix. 3. Artifact versus less likely a right femoral vein DVT. 4. Advanced aortoiliac atherosclerotic disease. Electronically Signed   By: Elgie Collard M.D.   On: 01/12/2018 23:06   Dg Abd 2 Views  Result Date: 01/25/2018 CLINICAL DATA:  Distended abdomen EXAM: ABDOMEN - 2 VIEW COMPARISON:  CT 07/07/2016 FINDINGS: Markedly distended colon diffusely. Air-fluid levels right colon. Small bowel nondilated. No free air. IMPRESSION: Marked colonic distention compatible with ileus. Electronically Signed    By: Marlan Palau M.D.   On: 01/26/2018 20:37    EKG:   Orders placed or performed during the hospital encounter of 01/25/2018  . ED EKG  . ED EKG    IMPRESSION AND PLAN:  Principal Problem:   Bowel obstruction (HCC) -General surgery was contacted by ED physician and made aware of patient's condition.  NG tube ordered Active Problems:   Hypokalemia -significant hypokalemia, we will replace this and monitor   COPD (chronic obstructive pulmonary disease) (HCC) -home dose medications   Hypomagnesemia -replace and monitor   Anxiety and depression -home dose anxiolytic and antidepressant  Chart review performed and case discussed with ED provider. Labs, imaging and/or ECG reviewed by provider and discussed with patient/family. Management plans discussed with the patient and/or family.  DVT PROPHYLAXIS: SubQ lovenox   GI PROPHYLAXIS:  None  ADMISSION STATUS: Inpatient     CODE STATUS: Full Code Status History    Date Active Date Inactive Code Status Order ID Comments User Context   02/21/2017 2129 02/23/2017 2029 Full Code [id number]  Shaune Pollack, MD Inpatient   07/08/2016 0053 07/10/2016 1616 Full Code [id number]  Tonye Royalty, DO Inpatient   08/20/2015 0141 08/23/2015 1630 Full Code [id number]  Joella Prince, MD ED   08/20/2015 0141 08/20/2015 0141 Full Code [id number]  Joella Prince, MD ED      TOTAL TIME TAKING CARE OF THIS PATIENT: 45 minutes.   Krishiv Sandler FIELDING 01/19/2018, 11:50 PM  [facility]  Office  [phone]  CC: Primary care physician; [facility]  Note:  This document was prepared using Dragon voice recognition software and may include unintentional dictation errors.

## 2017-12-30 NOTE — ED Provider Notes (Addendum)
[facility] Emergency Department Provider Note  ____________________________________________  Time seen: Approximately 8:02 PM  I have reviewed the triage vital signs and the nursing notes.   HISTORY  Chief Complaint Leg Swelling and abdominal distention   HPI Yvette Martinez is a 67 y.o. female with a history of COPD, diabetes, diverticulitis who presents for evaluation of bilateral lower extremity edema and abdominal distention.  Patient reports 4 to 6 weeks of constipation.  Has not had a bowel movement at all.  Has tried Dulcolax, Fleet enemas and suppositories with no relief.  Reports not passing gas at least for 1 week.  Has had nausea and vomits every time she eats also for the same duration of time.  She has noted progressively worsening distention of her abdomen and is complaining of diffuse sharp pain.  No fever or chills.  Over the last week she is noted bilateral lower extremity edema.  Patient does not drink alcohol, has no history of cirrhosis or congestive heart failure.  She lives with her granddaughter.  She has not seek care for these problems.  No chest pain or shortness of breath.  Past Medical History:  Diagnosis Date  . COPD (chronic obstructive pulmonary disease) (HCC)   . Diabetes mellitus without complication (HCC)   . Diverticula of colon   . DVT (deep vein thrombosis) in pregnancy   . Pneumonia     Patient Active Problem List   Diagnosis Date Noted  . Salmonella gastroenteritis 07/10/2016  . Hypokalemia 07/10/2016  . Hypoglycemia 07/10/2016  . Tobacco dependence 07/10/2016  . COPD (chronic obstructive pulmonary disease) (HCC) 07/10/2016  . Anxiety and depression 07/10/2016  . Diverticulitis 07/07/2016  . Acute respiratory failure with hypoxia (HCC) 08/23/2015  . COPD exacerbation (HCC) 08/23/2015  . Diverticulitis of large intestine with perforation without bleeding 08/23/2015  . Diarrhea 08/23/2015  . Generalized weakness  08/23/2015  . Tobacco abuse 08/23/2015  . CAP (community acquired pneumonia) 08/20/2015    Past Surgical History:  Procedure Laterality Date  . ABDOMINAL HYSTERECTOMY    . CYSTECTOMY     patient states beind her bladder  . TONSILLECTOMY      Prior to Admission medications   Medication Sig Start Date End Date Taking? Authorizing Provider  ALPRAZolam Prudy Feeler) 1 MG tablet Take 1 tablet by mouth every 6 (six) hours as needed for anxiety.    Yes [provider]  Fluticasone-Salmeterol (ADVAIR DISKUS) 100-50 MCG/DOSE AEPB Inhale 1 puff into the lungs 2 (two) times daily. 06/28/14  Yes [provider]  mirtazapine (REMERON) 30 MG tablet Take 30 mg by mouth 2 (two) times daily.    Yes [provider]  tiotropium (SPIRIVA) 18 MCG inhalation capsule Place 1 capsule into inhaler and inhale daily. 06/28/14  Yes [provider]  VENTOLIN HFA 108 (90 Base) MCG/ACT inhaler Inhale 1-2 puffs into the lungs every 6 (six) hours as needed for wheezing or shortness of breath.  03/26/15  Yes [provider]  albuterol (PROVENTIL) (2.5 MG/3ML) 0.083% nebulizer solution Take 3 mLs (2.5 mg total) by nebulization every 4 (four) hours as needed for wheezing or shortness of breath. Patient not taking: Reported on 02/21/2017 08/23/15   Katharina Caper, MD  bisacodyl (DULCOLAX) 10 MG suppository Place 1 suppository (10 mg total) rectally as needed for moderate constipation. 04/17/15   Emily Filbert, MD  guaiFENesin-dextromethorphan (ROBITUSSIN DM) 100-10 MG/5ML syrup Take 5 mLs by mouth every 4 (four) hours as needed for cough. 02/23/17  Shaune Pollack, MD  ibuprofen (ADVIL,MOTRIN) 200 MG tablet Take 200-400 mg by mouth every 6 (six) hours as needed.     [provider]  levofloxacin (LEVAQUIN) 750 MG tablet Take 1 tablet (750 mg total) by mouth daily. Patient not taking: Reported on 01/07/2018 02/23/17   Shaune Pollack, MD  montelukast (SINGULAIR) 10 MG tablet Take 10 mg  by mouth daily.  03/23/16 03/23/17  [provider]  predniSONE (DELTASONE) 20 MG tablet Take 2 tablets (40 mg total) by mouth daily with breakfast. Patient not taking: Reported on 2018-01-07 02/24/17   Shaune Pollack, MD  rivaroxaban (XARELTO) 20 MG TABS tablet Take 20 mg by mouth daily with supper.    [provider]    Allergies Penicillins; Venlafaxine; and Sulfa antibiotics  Family History  Problem Relation Age of Onset  . CAD Father   . Aneurysm Father   . Diabetes Brother   . Cancer Sister   . Kidney cancer Neg Hx   . Bladder Cancer Neg Hx     Social History Social History   Tobacco Use  . Smoking status: Current Every Day Smoker    Packs/day: 0.30    Types: Cigarettes  . Smokeless tobacco: Never Used  Substance Use Topics  . Alcohol use: No    Frequency: Never  . Drug use: No    Review of Systems  Constitutional: Negative for fever. Eyes: Negative for visual changes. ENT: Negative for sore throat. Neck: No neck pain  Cardiovascular: Negative for chest pain. Respiratory: Negative for shortness of breath. Gastrointestinal: + abdominal distention, abdominal pain, vomiting and constipation. Genitourinary: Negative for dysuria. Musculoskeletal: Negative for back pain. + b/l leg swelling Skin: Negative for rash. Neurological: Negative for headaches, weakness or numbness. Psych: No SI or HI  ____________________________________________   PHYSICAL EXAM:  VITAL SIGNS: ED Triage Vitals  Enc Vitals Group     BP 07-Jan-2018 1940 114/76     Pulse Rate 07-Jan-2018 1940 90     Resp 07-Jan-2018 1940 20     Temp Jan 07, 2018 1940 97.8 F (36.6 C)     Temp Source 01-07-18 1940 Oral     SpO2 01-07-2018 1940 96 %     Weight --      Height --      Head Circumference --      Peak Flow --      Pain Score 01-07-18 1941 10     Pain Loc --      Pain Edu? --      Excl. in GC? --     Constitutional: Alert and oriented. Well appearing and in no apparent  distress. HEENT:      Head: Normocephalic and atraumatic.         Eyes: Conjunctivae are normal. Sclera is non-icteric.       Mouth/Throat: Mucous membranes are moist.       Neck: Supple with no signs of meningismus. Cardiovascular: Regular rate and rhythm. No murmurs, gallops, or rubs. 2+ symmetrical distal pulses are present in all extremities. No JVD. Respiratory: Normal respiratory effort. Lungs are clear to auscultation bilaterally. No wheezes, crackles, or rhonchi.  Gastrointestinal: Severely distended abdomen with positive bowel sounds and no tenderness  Musculoskeletal: 2+ pitting edema in the right lower extremity and 3+ on the left lower extremity, no erythema or warmth  neurologic: Normal speech and language. Face is symmetric. Moving all extremities. No gross focal neurologic deficits are appreciated. Skin: Skin is warm, dry and intact. No rash  noted. Psychiatric: Mood and affect areflat. Speech and behavior are normal.  ____________________________________________   LABS (all labs ordered are listed, but only abnormal results are displayed)  Labs Reviewed  CBC WITH DIFFERENTIAL/PLATELET - Abnormal; Notable for the following components:      Result Value   WBC 12.9 (*)    Hemoglobin 15.1 (*)    Neutro Abs 10.7 (*)    Abs Immature Granulocytes 0.11 (*)    All other components within normal limits  COMPREHENSIVE METABOLIC PANEL - Abnormal; Notable for the following components:   Potassium <2.0 (*)    Chloride 87 (*)    CO2 35 (*)    Calcium 7.7 (*)    Total Protein 5.2 (*)    Albumin 2.8 (*)    Total Bilirubin 1.6 (*)    All other components within normal limits  MAGNESIUM - Abnormal; Notable for the following components:   Magnesium 1.5 (*)    All other components within normal limits  TROPONIN I - Abnormal; Notable for the following components:   Troponin I 0.04 (*)    All other components within normal limits  BRAIN NATRIURETIC PEPTIDE    ____________________________________________  EKG  ED ECG REPORT I, Nita Sickle, the attending physician, personally viewed and interpreted this ECG.  Normal sinus rhythm, rate of 85, prolonged QTC of 639, normal axis, no ST elevations or depressions. ____________________________________________  RADIOLOGY  I have personally reviewed the images performed during this visit and I agree with the Radiologist's read.   Interpretation by Radiologist:  Dg Chest 2 View  Result Date: Jan 15, 2018 CLINICAL DATA:  Bilateral lower extremity edema and abdominal distension. EXAM: CHEST - 2 VIEW COMPARISON:  Chest CT 06/08/2017 FINDINGS: Cardiomediastinal silhouette is normal. Mediastinal contours appear intact. Calcific atherosclerotic disease of the aorta. There is no evidence of focal airspace consolidation, pleural effusion or pneumothorax. Hyperinflation of the lungs. Mild bronchiectasis and peribronchial thickening, similar to the prior study. Osseous structures are without acute abnormality. Soft tissues are grossly normal. IMPRESSION: Mild bronchiectasis and peribronchial thickening, similar to the prior study. Hyperinflation of the lungs. No evidence of acute lobar airspace consolidation or pulmonary edema. Electronically Signed   By: Ted Mcalpine M.D.   On: 01-15-2018 20:37   Ct Abdomen Pelvis W Contrast  Result Date: January 15, 2018 CLINICAL DATA:  67 year old female with abdominal pain. History of sigmoid diverticulitis. EXAM: CT ABDOMEN AND PELVIS WITH CONTRAST TECHNIQUE: Multidetector CT imaging of the abdomen and pelvis was performed using the standard protocol following bolus administration of intravenous contrast. CONTRAST:  75mL OMNIPAQUE IOHEXOL 300 MG/ML  SOLN COMPARISON:  Abdominal radiograph dated January 15, 2018 and CT dated 07/07/2016 FINDINGS: Lower chest: Right lung base scarring primarily involving the right middle lobe noted. The left lung base is clear. No intra-abdominal  free air or free fluid. Hepatobiliary: No focal liver abnormality is seen. No gallstones, gallbladder wall thickening, or biliary dilatation. Pancreas: Unremarkable. No pancreatic ductal dilatation or surrounding inflammatory changes. Spleen: Normal in size without focal abnormality. Adrenals/Urinary Tract: Adrenal glands are unremarkable. Kidneys are normal, without renal calculi, focal lesion, or hydronephrosis. Bladder is unremarkable. Stomach/Bowel: There is diffuse severe distention of the colon with gas and semi-solid fecal matter. The transverse colon measure up to 9 cm in diameter. The distal sigmoid and rectum are collapsed. There is a transition zone in the sigmoid colon (series 2, image 64 and coronal series 5, image 45) in the area of prior diverticulitis. Findings likely represent an obstruction secondary to stricture or adhesion,  related to prior inflammatory process, although a mass is not excluded. A pseudo-obstruction or adynamic ileus are other consideration. There is no evidence of colonic twisting. There is sigmoid diverticulosis without active inflammatory changes. There is mild air distention of the distal small bowel and terminal ileum. Mildly thickened appearance of the distal small bowel loops in the pelvis may be reactive or represent enteritis. No evidence of small-bowel obstruction. There is no pneumatosis at this time. The appendix is unremarkable. Vascular/Lymphatic: Advanced aortoiliac atherosclerotic disease. Apparent low attenuating content within the proximal right femoral vein (series 2, image 87) likely mixing artifact. O'clock is less likely. Duplex ultrasound may provide better evaluation if there is clinical concern for DVT. The SMV, splenic vein, and main portal vein are patent. No portal venous gas. There is no adenopathy. Reproductive: Hysterectomy. No pelvic mass. Other: There is midline vertical anterior pelvic wall incisional scar. Subcutaneous edema of the left thigh.  Musculoskeletal: Osteopenia with degenerative changes of the spine. No acute osseous pathology. IMPRESSION: 1. Severe distention of the colon with transition at the sigmoid, possibly secondary to stricture and obstruction versus less likely a pseudo-obstruction or an ileus. Clinical correlation and surgical consult is advised. No pneumatosis or portal venous gas. 2. Mildly thickened appearance of the distal small bowel loops in the pelvis may be reactive or represent enteritis. No evidence of small-bowel obstruction. Normal appendix. 3. Artifact versus less likely a right femoral vein DVT. 4. Advanced aortoiliac atherosclerotic disease. Electronically Signed   By: Elgie Collard M.D.   On: 01/08/2018 23:06   Dg Abd 2 Views  Result Date: 01/16/2018 CLINICAL DATA:  Distended abdomen EXAM: ABDOMEN - 2 VIEW COMPARISON:  CT 07/07/2016 FINDINGS: Markedly distended colon diffusely. Air-fluid levels right colon. Small bowel nondilated. No free air. IMPRESSION: Marked colonic distention compatible with ileus. Electronically Signed   By: Marlan Palau M.D.   On: 01/27/2018 20:37      ____________________________________________   PROCEDURES  Procedure(s) performed: None Procedures Critical Care performed: yes  CRITICAL CARE Performed by: Nita Sickle  ?  Total critical care time: 40 min  Critical care time was exclusive of separately billable procedures and treating other patients.  Critical care was necessary to treat or prevent imminent or life-threatening deterioration.  Critical care was time spent personally by me on the following activities: development of treatment plan with patient and/or surrogate as well as nursing, discussions with consultants, evaluation of patient's response to treatment, examination of patient, obtaining history from patient or surrogate, ordering and performing treatments and interventions, ordering and review of laboratory studies, ordering and review of  radiographic studies, pulse oximetry and re-evaluation of patient's condition.  ____________________________________________   INITIAL IMPRESSION / ASSESSMENT AND PLAN / ED COURSE  67 y.o. female with a history of COPD, diabetes, diverticulitis who presents for evaluation of bilateral lower extremity edema and abdominal distention.  Differential diagnosis is broad and includes small bowel obstruction versus severe electrolyte abnormalities due to malnutrition versus cirrhosis of the liver versus malignancy.  Labs are pending.  Bedside ultrasound showed no evidence of ascites.  EKG showing no ischemic changes but very prolonged QTC concerning for electrolyte abnormalities.  Electrolytes are pending.  Will monitor patient on telemetry.    _________________________ 11:10 PM on 01/24/2018 -----------------------------------------  Labs showing severe abnormalities on patient's electrolytes including a potassium of less than 2.0 and magnesium of 1.5 in the setting of prolonged QTC.  Patient is connected to cardiac monitoring.  She was given 2 g of  magnesium and will be given 10 mEq of potassium every hour for 6 hours. CT concerning for stricture vs obstruction with severe dilated loops of bowel. Will place NG tube. Discussed with Dr. Anne Hahn and Dr. Tonna Boehringer for admission and management.   As part of my medical decision making, I reviewed the following data within the electronic MEDICAL RECORD NUMBER Nursing notes reviewed and incorporated, Labs reviewed , EKG interpreted , Old EKG reviewed, Old chart reviewed, Radiograph reviewed , Discussed with admitting physician , A consult was requested and obtained from this/these consultant(s) Surgery, Notes from prior ED visits and [place] Controlled Substance Database    Pertinent labs & imaging results that were available during my care of the patient were reviewed by me and considered in my medical decision making (see chart for  details).    ____________________________________________   FINAL CLINICAL IMPRESSION(S) / ED DIAGNOSES  Final diagnoses:  Abdominal distention  SBO (small bowel obstruction) (HCC)  Hypokalemia  Hypomagnesemia  Prolonged Q-T interval on ECG      NEW MEDICATIONS STARTED DURING THIS VISIT:  ED Discharge Orders    None       Note:  This document was prepared using Dragon voice recognition software and may include unintentional dictation errors.    Nita Sickle, MD 01-12-18 2314    Nita Sickle, MD 01/10/18 [phone]

## 2017-12-30 NOTE — ED Notes (Signed)
Pt reports she has not been able to have a BM since 10/10 reports she started to have abdominal distention reports edema on legs left leg more swelling than right leg. Reports lives at home with granddaughter.

## 2017-12-30 NOTE — ED Notes (Signed)
Date and time results received: 01/19/2018 2155 (use smartphrase ".now" to insert current time)  Test:Troponin  Critical Value: 0.04  Name of Provider Notified: Dr. Don Perking  Orders Received? Or Actions Taken?: Dr. Don Perking Acknowledge new orders placed

## 2017-12-30 NOTE — ED Notes (Signed)
Date and time results received: 01-03-18 2155 (use smartphrase ".now" to insert current time)  Test: Potassium  Critical Value: less than 2  Name of Provider Notified: Dr. Don Perking   Orders Received? Or Actions Taken?: Dr. Susette Racer placed new orders

## 2017-12-30 NOTE — ED Notes (Addendum)
Date and time results received: 01/07/2018 9:50 PM  Test: Troponin  Critical Value: 0.04 ng/mL  Test: Potassium Critical Value: <2.0 mmol/L  Name of Provider Notified: Dr. Don Perking, MD

## 2017-12-30 NOTE — ED Triage Notes (Signed)
Pt to ED via [facility] from home for bilateral LE edema and abdominal distention. Pt has significant swelling in the legs, left worse than right. Pt has significant swelling in the abdomen as well. Pt denies hx/o CHF or Liver disease. Pt is in NAD at this time.

## 2017-12-30 NOTE — ED Notes (Signed)
Patient transported to X-ray 

## 2017-12-31 ENCOUNTER — Encounter: Payer: Self-pay | Admitting: *Deleted

## 2017-12-31 ENCOUNTER — Inpatient Hospital Stay: Payer: Medicare HMO

## 2017-12-31 DIAGNOSIS — I82413 Acute embolism and thrombosis of femoral vein, bilateral: Secondary | ICD-10-CM

## 2017-12-31 DIAGNOSIS — E876 Hypokalemia: Secondary | ICD-10-CM

## 2017-12-31 DIAGNOSIS — R140 Abdominal distension (gaseous): Secondary | ICD-10-CM

## 2017-12-31 DIAGNOSIS — F17210 Nicotine dependence, cigarettes, uncomplicated: Secondary | ICD-10-CM

## 2017-12-31 DIAGNOSIS — K56691 Other complete intestinal obstruction: Secondary | ICD-10-CM

## 2017-12-31 LAB — CBC
HCT: 43 % (ref 36.0–46.0)
Hemoglobin: 14.9 g/dL (ref 12.0–15.0)
MCH: 32.5 pg (ref 26.0–34.0)
MCHC: 34.7 g/dL (ref 30.0–36.0)
MCV: 93.9 fL (ref 80.0–100.0)
PLATELETS: 196 10*3/uL (ref 150–400)
RBC: 4.58 MIL/uL (ref 3.87–5.11)
RDW: 13.4 % (ref 11.5–15.5)
WBC: 10.5 10*3/uL (ref 4.0–10.5)
nRBC: 0 % (ref 0.0–0.2)

## 2017-12-31 LAB — BASIC METABOLIC PANEL
Anion gap: 14 (ref 5–15)
BUN: 17 mg/dL (ref 8–23)
CALCIUM: 7.4 mg/dL — AB (ref 8.9–10.3)
CO2: 36 mmol/L — AB (ref 22–32)
CREATININE: 0.8 mg/dL (ref 0.44–1.00)
Chloride: 88 mmol/L — ABNORMAL LOW (ref 98–111)
GFR calc non Af Amer: >60 mL/min (ref >60)
Glucose, Bld: 83 mg/dL (ref 70–99)
Potassium: <2 mmol/L — CL (ref 3.5–5.1)
Sodium: 138 mmol/L (ref 135–145)

## 2017-12-31 LAB — APTT: aPTT: 26 seconds (ref 24–36)

## 2017-12-31 LAB — POTASSIUM: Potassium: 2.1 mmol/L — CL (ref 3.5–5.1)

## 2017-12-31 LAB — MAGNESIUM: Magnesium: 2.7 mg/dL — ABNORMAL HIGH (ref 1.7–2.4)

## 2017-12-31 LAB — PROTIME-INR
INR: 1.1
Prothrombin Time: 14.1 seconds (ref 11.4–15.2)

## 2017-12-31 LAB — HEPARIN LEVEL (UNFRACTIONATED): HEPARIN UNFRACTIONATED: 1.48 [IU]/mL — AB (ref 0.30–0.70)

## 2017-12-31 MED ORDER — MAGNESIUM SULFATE 2 GM/50ML IV SOLN
2.0000 g | Freq: Once | INTRAVENOUS | Status: AC
  Administered 2017-12-31: 2 g via INTRAVENOUS
  Filled 2017-12-31: qty 50

## 2017-12-31 MED ORDER — POTASSIUM CHLORIDE IN NACL 40-0.9 MEQ/L-% IV SOLN
INTRAVENOUS | Status: DC
  Administered 2017-12-31 – 2018-01-01 (×3): 100 mL/h via INTRAVENOUS
  Filled 2017-12-31 (×6): qty 1000

## 2017-12-31 MED ORDER — PROCHLORPERAZINE EDISYLATE 10 MG/2ML IJ SOLN
5.0000 mg | INTRAMUSCULAR | Status: DC | PRN
  Administered 2018-01-01: 5 mg via INTRAVENOUS
  Filled 2017-12-31 (×2): qty 1

## 2017-12-31 MED ORDER — ACETAMINOPHEN 325 MG PO TABS
650.0000 mg | ORAL_TABLET | Freq: Four times a day (QID) | ORAL | Status: DC | PRN
  Administered 2018-01-01: 650 mg via ORAL
  Filled 2017-12-31: qty 2

## 2017-12-31 MED ORDER — POTASSIUM CHLORIDE 20 MEQ PO PACK
40.0000 meq | PACK | ORAL | Status: DC
  Administered 2017-12-31: 40 meq via ORAL
  Filled 2017-12-31: qty 2

## 2017-12-31 MED ORDER — FLEET ENEMA 7-19 GM/118ML RE ENEM
1.0000 | ENEMA | Freq: Two times a day (BID) | RECTAL | Status: DC
  Administered 2017-12-31 (×2): 1 via RECTAL

## 2017-12-31 MED ORDER — HEPARIN BOLUS VIA INFUSION
3500.0000 [IU] | Freq: Once | INTRAVENOUS | Status: AC
  Administered 2017-12-31: 3500 [IU] via INTRAVENOUS
  Filled 2017-12-31: qty 3500

## 2017-12-31 MED ORDER — TIOTROPIUM BROMIDE MONOHYDRATE 18 MCG IN CAPS
1.0000 | ORAL_CAPSULE | Freq: Every day | RESPIRATORY_TRACT | Status: DC
  Administered 2017-12-31: 18 ug via RESPIRATORY_TRACT
  Filled 2017-12-31: qty 5

## 2017-12-31 MED ORDER — ALPRAZOLAM 1 MG PO TABS
1.0000 mg | ORAL_TABLET | Freq: Four times a day (QID) | ORAL | Status: DC | PRN
  Administered 2017-12-31 – 2018-01-01 (×4): 1 mg via ORAL
  Filled 2017-12-31 (×4): qty 2

## 2017-12-31 MED ORDER — LORAZEPAM 2 MG/ML IJ SOLN
0.5000 mg | Freq: Once | INTRAMUSCULAR | Status: AC
  Administered 2017-12-31: 0.5 mg via INTRAVENOUS
  Filled 2017-12-31: qty 1

## 2017-12-31 MED ORDER — POTASSIUM CHLORIDE CRYS ER 20 MEQ PO TBCR
40.0000 meq | EXTENDED_RELEASE_TABLET | Freq: Once | ORAL | Status: AC
  Administered 2017-12-31: 40 meq via ORAL
  Filled 2017-12-31: qty 2

## 2017-12-31 MED ORDER — POTASSIUM CHLORIDE 10 MEQ/100ML IV SOLN
10.0000 meq | INTRAVENOUS | Status: AC
  Administered 2017-12-31 (×4): 10 meq via INTRAVENOUS
  Filled 2017-12-31 (×3): qty 100

## 2017-12-31 MED ORDER — HEPARIN (PORCINE) IN NACL 100-0.45 UNIT/ML-% IJ SOLN
750.0000 [IU]/h | INTRAMUSCULAR | Status: DC
  Administered 2017-12-31: 750 [IU]/h via INTRAVENOUS

## 2017-12-31 MED ORDER — ALPRAZOLAM 0.5 MG PO TABS: 1.0000 mg | ORAL_TABLET | Freq: Four times a day (QID) | ORAL | Status: DC | PRN

## 2017-12-31 MED ORDER — HEPARIN (PORCINE) IN NACL 100-0.45 UNIT/ML-% IJ SOLN
900.0000 [IU]/h | INTRAMUSCULAR | Status: DC
  Administered 2017-12-31: 900 [IU]/h via INTRAVENOUS
  Filled 2017-12-31: qty 250

## 2017-12-31 MED ORDER — ACETAMINOPHEN 650 MG RE SUPP: 650.0000 mg | Freq: Four times a day (QID) | RECTAL | Status: DC | PRN

## 2017-12-31 MED ORDER — MOMETASONE FURO-FORMOTEROL FUM 100-5 MCG/ACT IN AERO
2.0000 | INHALATION_SPRAY | Freq: Two times a day (BID) | RESPIRATORY_TRACT | Status: DC
  Administered 2017-12-31 (×2): 2 via RESPIRATORY_TRACT
  Filled 2017-12-31: qty 8.8

## 2017-12-31 MED ORDER — ENOXAPARIN SODIUM 40 MG/0.4ML ~~LOC~~ SOLN: 40.0000 mg | SUBCUTANEOUS | Status: DC

## 2017-12-31 MED ORDER — IOPAMIDOL (ISOVUE-370) INJECTION 76%
75.0000 mL | Freq: Once | INTRAVENOUS | Status: AC | PRN
  Administered 2017-12-31: 75 mL via INTRAVENOUS

## 2017-12-31 NOTE — Consult Note (Addendum)
Subjective:   CC: obstruction  HPI:  Yvette Martinez is a 67 y.o. female who was consulted by Don Perking for issue above.  Symptoms were first noted a few weeks ago. Pain is minimal, mostly periumbilical.  Associated with constipation, exacerbated by nothing specific.  She has not had a BM for over two weeks, states she used have daily BMs prior.  Recently undergoing family stressors and have not taken care of herself.  She also complains of leg swelling bilaterally.  Initially started on right side, but now is worse on left.  Denies any pain in legs.  She has not had colonoscopy.  She did not f/u after her diverticulitis episode last year.  Was on anticoagulant for her DVT episode on left, now off.       Past Medical History:  has a past medical history of COPD (chronic obstructive pulmonary disease) (HCC), Diabetes mellitus without complication (HCC), Diverticula of colon, DVT (deep vein thrombosis) in pregnancy, and Pneumonia.  Past Surgical History:  Past Surgical History:  Procedure Laterality Date  . ABDOMINAL HYSTERECTOMY    . CYSTECTOMY     patient states beind her bladder  . TONSILLECTOMY      Family History: family history includes Aneurysm in her father; CAD in her father; Cancer in her sister; Diabetes in her brother.  Social History:  reports that she has been smoking cigarettes. She has been smoking about 0.30 packs per day. She has never used smokeless tobacco. She reports that she does not drink alcohol or use drugs.  Current Medications: denies any except for occaisonal aspirin  Allergies:  Allergies as of 01/05/2018 - Review Complete 01/22/2018  Allergen Reaction Noted  . Penicillins Rash   . Venlafaxine Swelling   . Sulfa antibiotics Hives 04/12/2015    ROS:  A 15 point review of systems was performed and pertinent positives and negatives noted in HPI   Objective:     BP (!) 109/56   Pulse 83   Temp 97.8 F (36.6 C) (Oral)   Resp 14   SpO2 95%    Constitutional :  alert, cooperative, appears stated age, cachectic and pale  Lymphatics/Throat:  no asymmetry, masses, or scars  Respiratory:  clear to auscultation bilaterally  Cardiovascular:  regular rate and rhythm  Gastrointestinal: soft, non-tender but extremely distended.  no overlying skin changes..   Musculoskeletal: Steady movement, increased swelling BLE, L>R non-tender  Skin: Cool and moist   Psychiatric: Normal affect, non-agitated, not confused       LABS:  CMP Latest Ref Rng & Units 01/28/2018 02/23/2017 02/22/2017  Glucose 70 - 99 mg/dL 98 865(H) 846(N)  BUN 8 - 23 mg/dL 19 20 19   Creatinine 0.44 - 1.00 mg/dL 6.29 5.28 4.13  Sodium 135 - 145 mmol/L 137 139 138  Potassium 3.5 - 5.1 mmol/L <2.0(LL) 3.5 3.3(L)  Chloride 98 - 111 mmol/L 87(L) 105 102  CO2 22 - 32 mmol/L 35(H) 27 28  Calcium 8.9 - 10.3 mg/dL 7.7(L) 9.0 8.9  Total Protein 6.5 - 8.1 g/dL 5.2(L) - -  Total Bilirubin 0.3 - 1.2 mg/dL 2.4(M) - -  Alkaline Phos 38 - 126 U/L 116 - -  AST 15 - 41 U/L 40 - -  ALT 0 - 44 U/L 28 - -   CBC Latest Ref Rng & Units 01/03/2018 02/22/2017 02/21/2017  WBC 4.0 - 10.5 K/uL 12.9(H) 6.4 6.3  Hemoglobin 12.0 - 15.0 g/dL 15.1(H) 13.8 15.3  Hematocrit 36.0 - 46.0 % 43.1  40.7 45.1  Platelets 150 - 400 K/uL 225 164 198    RADS: CLINICAL DATA:  67 year old female with abdominal pain. History of sigmoid diverticulitis.  EXAM: CT ABDOMEN AND PELVIS WITH CONTRAST  TECHNIQUE: Multidetector CT imaging of the abdomen and pelvis was performed using the standard protocol following bolus administration of intravenous contrast.  CONTRAST:  75mL OMNIPAQUE IOHEXOL 300 MG/ML  SOLN  COMPARISON:  Abdominal radiograph dated 01/17/2018 and CT dated 07/07/2016  FINDINGS: Lower chest: Right lung base scarring primarily involving the right middle lobe noted. The left lung base is clear.  No intra-abdominal free air or free fluid.  Hepatobiliary: No focal liver abnormality  is seen. No gallstones, gallbladder wall thickening, or biliary dilatation.  Pancreas: Unremarkable. No pancreatic ductal dilatation or surrounding inflammatory changes.  Spleen: Normal in size without focal abnormality.  Adrenals/Urinary Tract: Adrenal glands are unremarkable. Kidneys are normal, without renal calculi, focal lesion, or hydronephrosis. Bladder is unremarkable.  Stomach/Bowel: There is diffuse severe distention of the colon with gas and semi-solid fecal matter. The transverse colon measure up to 9 cm in diameter. The distal sigmoid and rectum are collapsed. There is a transition zone in the sigmoid colon (series 2, image 64 and coronal series 5, image 45) in the area of prior diverticulitis. Findings likely represent an obstruction secondary to stricture or adhesion, related to prior inflammatory process, although a mass is not excluded. A pseudo-obstruction or adynamic ileus are other consideration. There is no evidence of colonic twisting. There is sigmoid diverticulosis without active inflammatory changes. There is mild air distention of the distal small bowel and terminal ileum. Mildly thickened appearance of the distal small bowel loops in the pelvis may be reactive or represent enteritis. No evidence of small-bowel obstruction. There is no pneumatosis at this time. The appendix is unremarkable.  Vascular/Lymphatic: Advanced aortoiliac atherosclerotic disease. Apparent low attenuating content within the proximal right femoral vein (series 2, image 87) likely mixing artifact. O'clock is less likely. Duplex ultrasound may provide better evaluation if there is clinical concern for DVT. The SMV, splenic vein, and main portal vein are patent. No portal venous gas. There is no adenopathy.  Reproductive: Hysterectomy. No pelvic mass.  Other: There is midline vertical anterior pelvic wall incisional scar. Subcutaneous edema of the left  thigh.  Musculoskeletal: Osteopenia with degenerative changes of the spine. No acute osseous pathology.  IMPRESSION: 1. Severe distention of the colon with transition at the sigmoid, possibly secondary to stricture and obstruction versus less likely a pseudo-obstruction or an ileus. Clinical correlation and surgical consult is advised. No pneumatosis or portal venous gas. 2. Mildly thickened appearance of the distal small bowel loops in the pelvis may be reactive or represent enteritis. No evidence of small-bowel obstruction. Normal appendix. 3. Artifact versus less likely a right femoral vein DVT. 4. Advanced aortoiliac atherosclerotic disease.   Electronically Signed   By: Elgie Collard M.D.   On: 01/10/2018 23:06 Assessment:   Severe large bowel obstruction, transition point likely at distal sigmoid.  Severe hypokalemia  leukocytosis  Plan:    Discussed pathophisiology and treatment options including NG tube decompression and possible surgery for lysis of adhesions, laparoscopic or open.  The risk of surgery include, but not limited to, recurrence, bleeding, chronic pain, post-op infxn, post-op SBO or ileus, hernias, resection of bowel, re-anastamosis, possible ostomy placement and need for re-operation to address said risks. The risks of general anesthetic, if used, includes MI, CVA, sudden death or even reaction to anesthetic medications  also discussed. Alternatives include continued observation and NG decompression.  Benefits include possible symptom relief, preventing further decline in health and possible death.  Typical post-op recovery time of additional days in hospital for observation afterwards also discussed.  The patient verbalized understanding and all questions were answered to the patient's satisfaction.  Due to severe hypokalemia, patient is at extremely high risk of anesthesia complications.  Fortunately, abdominal exam is benign relative to very  concerning CT scan results.    I recommended NG tube decompression with strict NPO to minimize worsening of current distention.  I explained to her that due to the very distal nature of the transition point, and extent of colon distention, I am not too hopeful that this will resolve on it's own and she will need surgical intervention as soon as her electrolytes normalize.  Encourage ambulation in the meantime.  I also will recommend STAT BLE doppler to assess whether or not she has recurrent left sided DVT, possible new onset RLE DVT.  Will monitor very closely with serial abdominal exams for any signs of perforation.  Currently stable, further medical management per primary team.

## 2017-12-31 NOTE — Progress Notes (Addendum)
Subjective:  CC:  Yvette Martinez is a 67 y.o. female  Hospital stay day 1,   sigmoid colon obstruction  HPI: NO issue since admission, denies flatus or BM.  No pain.  ROS:  A 5 point review of systems was performed and pertinent positives and negatives noted in HPI.   Objective:      Temp:  [97.7 F (36.5 C)-98.1 F (36.7 C)] 97.7 F (36.5 C) (11/02 0636) Pulse Rate:  [79-90] 84 (11/02 0636) Resp:  [14-20] 16 (11/02 0636) BP: (92-135)/(55-76) 117/65 (11/02 0636) SpO2:  [93 %-98 %] 96 % (11/02 0636) Weight:  [52.9 kg] 52.9 kg (11/02 0100)     Height: 5\' 2"  (157.5 cm) Weight: 52.9 kg BMI (Calculated): 21.33   Intake/Output this shift:  No intake/output data recorded.       Constitutional :  alert, cooperative, appears stated age and no distress  Respiratory:  clear to auscultation bilaterally  Cardiovascular:  regular rate and rhythm  Gastrointestinal: soft, non-tender,  but with severe distention  Skin: Cool and moist.   Psychiatric: Normal affect, non-agitated, not confused       LABS:  CMP Latest Ref Rng & Units 12/31/2017 01/06/2018 02/23/2017  Glucose 70 - 99 mg/dL 83 98 161(W)  BUN 8 - 23 mg/dL 17 19 20   Creatinine 0.44 - 1.00 mg/dL 9.60 4.54 0.98  Sodium 135 - 145 mmol/L 138 137 139  Potassium 3.5 - 5.1 mmol/L <2.0(LL) <2.0(LL) 3.5  Chloride 98 - 111 mmol/L 88(L) 87(L) 105  CO2 22 - 32 mmol/L 36(H) 35(H) 27  Calcium 8.9 - 10.3 mg/dL 7.4(L) 7.7(L) 9.0  Total Protein 6.5 - 8.1 g/dL - 5.2(L) -  Total Bilirubin 0.3 - 1.2 mg/dL - 1.6(H) -  Alkaline Phos 38 - 126 U/L - 116 -  AST 15 - 41 U/L - 40 -  ALT 0 - 44 U/L - 28 -   CBC Latest Ref Rng & Units 12/31/2017 01/14/2018 02/22/2017  WBC 4.0 - 10.5 K/uL 10.5 12.9(H) 6.4  Hemoglobin 12.0 - 15.0 g/dL 11.9 15.1(H) 13.8  Hematocrit 36.0 - 46.0 % 43.0 43.1 40.7  Platelets 150 - 400 K/uL 196 225 164    RADS: n/a Assessment:   Sigmoid colon obstruction.  Mass vs stricture from previous diverticulitis.     Contacted Dr. Servando Snare, GI for diagnostic colonoscopy, possible decompression, since taking patient to surgery at this time will guarantee a colostomy due to the size difference between the proximal and distal colon segments that will prevent creation of primary anastamosis.  He stated he will evaluate patient.  She remains clinically stable despite persistent dilation and low K levels.  Continue present management with NG tube decompression and medical management per primary team.  Ok to clamp NG tube and do sips with meds now

## 2017-12-31 NOTE — Progress Notes (Signed)
ANTICOAGULATION CONSULT NOTE - Initial Consult  Pharmacy Consult for heparin Indication: VTE treatment  Allergies  Allergen Reactions  . Penicillins Rash and Other (See Comments)    Has patient had a PCN reaction causing immediate rash, facial/tongue/throat swelling, SOB or lightheadedness with hypotension: Unknown Has patient had a PCN reaction causing severe rash involving mucus membranes or skin necrosis: Unknown Has patient had a PCN reaction that required hospitalization: No Has patient had a PCN reaction occurring within the last 10 years: No If all of the above answers are "NO", then may proceed with Cephalosporin use.   . Venlafaxine Swelling  . Sulfa Antibiotics Hives    Patient Measurements: Height: 5\' 2"  (157.5 cm) Weight: 116 lb 10 oz (52.9 kg) IBW/kg (Calculated) : 50.1 Heparin Dosing Weight: 52.9 kg  Vital Signs: Temp: 98 F (36.7 C) (11/02 1237) Temp Source: Oral (11/02 1237) BP: 110/68 (11/02 1237) Pulse Rate: 91 (11/02 1237)  Labs: Recent Labs    01/08/2018 2000 01/10/2018 2036 12/31/17 0451  HGB 15.1*  --  14.9  HCT 43.1  --  43.0  PLT 225  --  196  CREATININE  --  0.84 0.80  TROPONINI  --  0.04*  --     Estimated Creatinine Clearance: 52.5 mL/min (by C-G formula based on SCr of 0.8 mg/dL).   Medical History: Past Medical History:  Diagnosis Date  . COPD (chronic obstructive pulmonary disease) (HCC)   . Diabetes mellitus without complication (HCC)   . Diverticula of colon   . DVT (deep vein thrombosis) in pregnancy   . Pneumonia     Medications:  Infusions:  . 0.9 % NaCl with KCl 40 mEq / L 100 mL/hr (12/31/17 0413)  . heparin    . potassium chloride 10 mEq (12/31/17 1008)    Assessment: 67 yof cc leg swelling and abdominal distention with PMH COPD, DM, DVT in pregnancy, diverticula, pneumonia. Now US showing b/l DVT. Pharmacy consulted to dose heparin for DVT. NO PTA OAC noted.   Goal of Therapy:  Heparin level 0.3-0.7  units/ml Monitor platelets by anticoagulation protocol: Yes   Plan:  Give 3500 units bolus x 1 Start heparin infusion at 900 units/hr Check anti-Xa level in 6 hours and daily while on heparin Continue to monitor H&H and platelets  Carola Frost, Pharm.D., BCPS Clinical Pharmacist 12/31/2017,2:06 PM

## 2017-12-31 NOTE — ED Notes (Signed)
Admiting MD at bedside

## 2017-12-31 NOTE — Progress Notes (Signed)
CRITICAL VALUE ALERT  Critical Value: K 2.1   Date & Time Notied:  12/31/2017 1400  Provider Notified: Elisabeth Pigeon  Orders Received/Actions taken:No new   orders.   Current value Higher than previous value.

## 2017-12-31 NOTE — Consult Note (Signed)
Hematology/Oncology Consult note [facility] Telephone:([phone] [phone]  Patient Care Team: [facility] as PCP - General (Family Medicine)   Name of the patient: Yvette Martinez  [id number]  [birth year]   Date of visit: 12/31/17 REASON FOR COSULTATION:  DVT, suspected malignancy. History of presenting illness-  67 y.o. female with PMH listed at below who presents to ER for evaluation of significant abdominal distention, bilateral lower extremity edema. Reports abdomen has been growing more distended over the last 4 weeks, last bowel movement was almost 2 weeks ago.  Denies any blood in the stool.Never had colonoscopy She also had nausea and vomiting.  She reports  had an episode of left lower extremity DVT and she took blood thinner for a few months.  Currently off anticoagulation.     Noticed bilateral lower extremity swelling, started on the left side now also on the right side.  In the emergency room, abdomen x-ray showed markedly distended colon diffusely.  Air-fluid levels right colon.  Small bowel nondilated.  No free air.  Compatible with ileus.  She also had CT abdomen pelvis with contrast which showed severe distention of the colon with transition at the sigmoid, possibly secondary to stricture and obstruction versus less likely a pseudoobstruction or an ileus.  Mildly thickened appearance of distal small bowel loops in the pelvis may be reactive or representing enteritis.  No evidence of small bowel obstruction.  Normal appendix. Lower extremity ultrasound venous bilateral showed positive for bilateral DVT.  There is partially occlusive thrombus in the proximal right femoral vein and partially occlusive thrombus in the left popliteal vein. CT angios chest PE protocol showed small pulmonary emboli within the left upper lobe pulmonary arterial branches without right heart strain.  Moderate mucous plugging without significant  consolidation.  Patient was also found to have severe hypokalemia. Patient was admitted for further evaluation.  Patient was seen and evaluated by Dr. Tonna Boehringer. Patient is currently on n.p.o., NG tube decompression, likely need surgical intervention as soon as her electrolytes normalized.  Patient sister was at bedside.  Patient has not been doing well since early this year.  She has significant weight loss, lack of appetite, feeling weak not walking around.  Family history of sister had cervical cancer.  Sister reports there may be other family members with prostate cancer, details unknown. Review of Systems  Constitutional: Positive for malaise/fatigue and weight loss. Negative for fever.  HENT: Negative for sore throat.   Eyes: Negative for redness.  Respiratory: Positive for shortness of breath. Negative for cough.   Cardiovascular: Positive for leg swelling. Negative for chest pain.  Gastrointestinal: Positive for abdominal pain, constipation, nausea and vomiting. Negative for blood in stool.  Genitourinary: Negative for dysuria.  Skin: Negative for rash.  Neurological: Positive for weakness.  Psychiatric/Behavioral: Negative for hallucinations.    Allergies  Allergen Reactions  . Penicillins Rash and Other (See Comments)    Has patient had a PCN reaction causing immediate rash, facial/tongue/throat swelling, SOB or lightheadedness with hypotension: Unknown Has patient had a PCN reaction causing severe rash involving mucus membranes or skin necrosis: Unknown Has patient had a PCN reaction that required hospitalization: No Has patient had a PCN reaction occurring within the last 10 years: No If all of the above answers are "NO", then may proceed with Cephalosporin use.   . Venlafaxine Swelling  . Sulfa Antibiotics Hives    Patient Active Problem List   Diagnosis Date Noted  . Abdominal distention   .  Bowel obstruction (HCC) 01/12/2018  . Hypomagnesemia 01/25/2018  .  Salmonella gastroenteritis 07/10/2016  . Hypokalemia 07/10/2016  . Hypoglycemia 07/10/2016  . Tobacco dependence 07/10/2016  . COPD (chronic obstructive pulmonary disease) (HCC) 07/10/2016  . Anxiety and depression 07/10/2016  . Diverticulitis 07/07/2016  . Acute respiratory failure with hypoxia (HCC) 08/23/2015  . COPD exacerbation (HCC) 08/23/2015  . Diverticulitis of large intestine with perforation without bleeding 08/23/2015  . Diarrhea 08/23/2015  . Generalized weakness 08/23/2015  . Tobacco abuse 08/23/2015  . CAP (community acquired pneumonia) 08/20/2015     Past Medical History:  Diagnosis Date  . COPD (chronic obstructive pulmonary disease) (HCC)   . Diabetes mellitus without complication (HCC)   . Diverticula of colon   . DVT (deep vein thrombosis) in pregnancy   . Pneumonia      Past Surgical History:  Procedure Laterality Date  . ABDOMINAL HYSTERECTOMY    . CYSTECTOMY     patient states beind her bladder  . TONSILLECTOMY      Social History   Socioeconomic History  . Marital status: Divorced    Spouse name: Not on file  . Number of children: Not on file  . Years of education: Not on file  . Highest education level: Not on file  Occupational History  . Not on file  Social Needs  . Financial resource strain: Not on file  . Food insecurity:    Worry: Not on file    Inability: Not on file  . Transportation needs:    Medical: Not on file    Non-medical: Not on file  Tobacco Use  . Smoking status: Current Every Day Smoker    Packs/day: 0.30    Types: Cigarettes  . Smokeless tobacco: Never Used  Substance and Sexual Activity  . Alcohol use: No    Frequency: Never  . Drug use: No  . Sexual activity: Not on file  Lifestyle  . Physical activity:    Days per week: Not on file    Minutes per session: Not on file  . Stress: Not on file  Relationships  . Social connections:    Talks on phone: Not on file    Gets together: Not on file    Attends  religious service: Not on file    Active member of club or organization: Not on file    Attends meetings of clubs or organizations: Not on file    Relationship status: Not on file  . Intimate partner violence:    Fear of current or ex partner: Not on file    Emotionally abused: Not on file    Physically abused: Not on file    Forced sexual activity: Not on file  Other Topics Concern  . Not on file  Social History Narrative  . Not on file     Family History  Problem Relation Age of Onset  . CAD Father   . Aneurysm Father   . Diabetes Brother   . Cancer Sister   . Kidney cancer Neg Hx   . Bladder Cancer Neg Hx      Current Facility-Administered Medications:  .  0.9 % NaCl with KCl 40 mEq / L  infusion, , Intravenous, Continuous, Oralia Manis, MD, Last Rate: 100 mL/hr at 12/31/17 0413, 100 mL/hr at 12/31/17 0413 .  acetaminophen (TYLENOL) tablet 650 mg, 650 mg, Oral, Q6H PRN **OR** acetaminophen (TYLENOL) suppository 650 mg, 650 mg, Rectal, Q6H PRN, Oralia Manis, MD .  ALPRAZolam Prudy Feeler) tablet 1  mg, 1 mg, Oral, Q6H PRN, Sakai, Isami, DO, 1 mg at 12/31/17 1005 .  heparin ADULT infusion 100 units/mL (25000 units/235mL sodium chloride 0.45%), 900 Units/hr, Intravenous, Continuous, Vachhani, Heath Gold, MD .  heparin bolus via infusion 3,500 Units, 3,500 Units, Intravenous, Once, Altamese Dilling, MD .  mometasone-formoterol [facility]) 100-5 MCG/ACT inhaler 2 puff, 2 puff, Inhalation, BID, Oralia Manis, MD, 2 puff at 12/31/17 1006 .  potassium chloride (KLOR-CON) packet 40 mEq, 40 mEq, Oral, Q4H, Altamese Dilling, MD .  potassium chloride 10 mEq in 100 mL IVPB, 10 mEq, Intravenous, Q1 Hr x 5, Altamese Dilling, MD, Last Rate: 100 mL/hr at 12/31/17 1424, 10 mEq at 12/31/17 1424 .  prochlorperazine (COMPAZINE) injection 5 mg, 5 mg, Intravenous, Q4H PRN, Oralia Manis, MD .  sodium phosphate (FLEET) 7-19 GM/118ML enema 1 enema, 1 enema, Rectal, BID, Servando Snare, Darren, MD .   tiotropium (SPIRIVA) inhalation capsule ([facility] use ONLY) 18 mcg, 1 capsule, Inhalation, Daily, Oralia Manis, MD   Physical exam:  Vitals:   12/31/17 0100 12/31/17 0144 12/31/17 0636 12/31/17 1237  BP:  (!) 98/59 117/65 110/68  Pulse:  89 84 91  Resp:   16 16  Temp:  97.7 F (36.5 C) 97.7 F (36.5 C) 98 F (36.7 C)  TempSrc:  Oral Oral Oral  SpO2:  97% 96% 96%  Weight: 116 lb 10 oz (52.9 kg)     Height: 5\' 2"  (1.575 m)      Physical Exam  Constitutional: No distress.  HENT:  Head: Normocephalic and atraumatic.  NG tube in place  Eyes: Pupils are equal, round, and reactive to light. EOM are normal.  Neck: Neck supple.  Cardiovascular: Normal rate.  Pulmonary/Chest: Effort normal. No respiratory distress.  Abdominal: Soft. She exhibits distension. There is no guarding.  Hyperactive bowel sounds  Musculoskeletal: Normal range of motion. She exhibits edema.  Bilateral lower extremity edema  Neurological: She is alert.  Skin: Skin is dry.  Ecchymosis on lower extremity and right upper chest wall  Psychiatric: Affect normal.        CMP Latest Ref Rng & Units 12/31/2017  Glucose 70 - 99 mg/dL -  BUN 8 - 23 mg/dL -  Creatinine 1.61 - 0.96 mg/dL -  Sodium 045 - 409 mmol/L -  Potassium 3.5 - 5.1 mmol/L 2.1(LL)  Chloride 98 - 111 mmol/L -  CO2 22 - 32 mmol/L -  Calcium 8.9 - 10.3 mg/dL -  Total Protein 6.5 - 8.1 g/dL -  Total Bilirubin 0.3 - 1.2 mg/dL -  Alkaline Phos 38 - 811 U/L -  AST 15 - 41 U/L -  ALT 0 - 44 U/L -   CBC Latest Ref Rng & Units 12/31/2017  WBC 4.0 - 10.5 K/uL 10.5  Hemoglobin 12.0 - 15.0 g/dL 91.4  Hematocrit 78.2 - 46.0 % 43.0  Platelets 150 - 400 K/uL 196   RADIOGRAPHIC STUDIES: I have personally reviewed the radiological images as listed and agreed with the findings in the report. Dg Chest 2 View  Result Date: 01/24/2018 CLINICAL DATA:  Bilateral lower extremity edema and abdominal distension. EXAM: CHEST - 2 VIEW COMPARISON:  Chest CT  06/08/2017 FINDINGS: Cardiomediastinal silhouette is normal. Mediastinal contours appear intact. Calcific atherosclerotic disease of the aorta. There is no evidence of focal airspace consolidation, pleural effusion or pneumothorax. Hyperinflation of the lungs. Mild bronchiectasis and peribronchial thickening, similar to the prior study. Osseous structures are without acute abnormality. Soft tissues are grossly normal. IMPRESSION: Mild bronchiectasis and  peribronchial thickening, similar to the prior study. Hyperinflation of the lungs. No evidence of acute lobar airspace consolidation or pulmonary edema. Electronically Signed   By: Ted Mcalpine M.D.   On: 01/22/2018 20:37   Ct Abdomen Pelvis W Contrast  Result Date: 01/07/2018 CLINICAL DATA:  67 year old female with abdominal pain. History of sigmoid diverticulitis. EXAM: CT ABDOMEN AND PELVIS WITH CONTRAST TECHNIQUE: Multidetector CT imaging of the abdomen and pelvis was performed using the standard protocol following bolus administration of intravenous contrast. CONTRAST:  75mL OMNIPAQUE IOHEXOL 300 MG/ML  SOLN COMPARISON:  Abdominal radiograph dated 01/13/2018 and CT dated 07/07/2016 FINDINGS: Lower chest: Right lung base scarring primarily involving the right middle lobe noted. The left lung base is clear. No intra-abdominal free air or free fluid. Hepatobiliary: No focal liver abnormality is seen. No gallstones, gallbladder wall thickening, or biliary dilatation. Pancreas: Unremarkable. No pancreatic ductal dilatation or surrounding inflammatory changes. Spleen: Normal in size without focal abnormality. Adrenals/Urinary Tract: Adrenal glands are unremarkable. Kidneys are normal, without renal calculi, focal lesion, or hydronephrosis. Bladder is unremarkable. Stomach/Bowel: There is diffuse severe distention of the colon with gas and semi-solid fecal matter. The transverse colon measure up to 9 cm in diameter. The distal sigmoid and rectum are  collapsed. There is a transition zone in the sigmoid colon (series 2, image 64 and coronal series 5, image 45) in the area of prior diverticulitis. Findings likely represent an obstruction secondary to stricture or adhesion, related to prior inflammatory process, although a mass is not excluded. A pseudo-obstruction or adynamic ileus are other consideration. There is no evidence of colonic twisting. There is sigmoid diverticulosis without active inflammatory changes. There is mild air distention of the distal small bowel and terminal ileum. Mildly thickened appearance of the distal small bowel loops in the pelvis may be reactive or represent enteritis. No evidence of small-bowel obstruction. There is no pneumatosis at this time. The appendix is unremarkable. Vascular/Lymphatic: Advanced aortoiliac atherosclerotic disease. Apparent low attenuating content within the proximal right femoral vein (series 2, image 87) likely mixing artifact. O'clock is less likely. Duplex ultrasound may provide better evaluation if there is clinical concern for DVT. The SMV, splenic vein, and main portal vein are patent. No portal venous gas. There is no adenopathy. Reproductive: Hysterectomy. No pelvic mass. Other: There is midline vertical anterior pelvic wall incisional scar. Subcutaneous edema of the left thigh. Musculoskeletal: Osteopenia with degenerative changes of the spine. No acute osseous pathology. IMPRESSION: 1. Severe distention of the colon with transition at the sigmoid, possibly secondary to stricture and obstruction versus less likely a pseudo-obstruction or an ileus. Clinical correlation and surgical consult is advised. No pneumatosis or portal venous gas. 2. Mildly thickened appearance of the distal small bowel loops in the pelvis may be reactive or represent enteritis. No evidence of small-bowel obstruction. Normal appendix. 3. Artifact versus less likely a right femoral vein DVT. 4. Advanced aortoiliac  atherosclerotic disease. Electronically Signed   By: Elgie Collard M.D.   On: 01/24/2018 23:06   US Venous Img Lower Bilateral  Result Date: 12/31/2017 CLINICAL DATA:  Bilateral lower extremity swelling for 1 week EXAM: BILATERAL LOWER EXTREMITY VENOUS DUPLEX ULTRASOUND TECHNIQUE: Doppler venous assessment of the bilateral lower extremity deep venous system was performed, including characterization of spectral flow, compressibility, and phasicity. COMPARISON:  None. FINDINGS: Right lower extremity: There is nonocclusive thrombus in the proximal right femoral vein. It is partially occlusive. The common femoral vein, mid right femoral vein, distal right femoral vein,  and right popliteal vein are compressible. Doppler analysis demonstrates respiratory phasicity. Augmentation was deferred. Left lower extremity: There is complete compressibility of the left common femoral and femoral veins. The left popliteal vein is partially occlusive and contains partially occlusive thrombus. Doppler analysis demonstrates respiratory phasicity. Augmentation was deferred. IMPRESSION: The study is positive for bilateral DVT. There is partially occlusive thrombus in the proximal right femoral vein and partially occlusive thrombus in the left popliteal vein. Electronically Signed   By: Jolaine Click M.D.   On: 12/31/2017 13:30   Dg Abd 2 Views  Result Date: 01/03/2018 CLINICAL DATA:  Distended abdomen EXAM: ABDOMEN - 2 VIEW COMPARISON:  CT 07/07/2016 FINDINGS: Markedly distended colon diffusely. Air-fluid levels right colon. Small bowel nondilated. No free air. IMPRESSION: Marked colonic distention compatible with ileus. Electronically Signed   By: Marlan Palau M.D.   On: 01/16/2018 20:37    Assessment and plan- Patient is a 67 y.o. female who presents to emergency room for evaluation of abdominal distention and lower extremity swelling.  Was found to have colon obstruction with a transition point at the sigmoid, severe  hypokalemia.  #: Bowel obstruction, continue supportive care.  N.p.o., NG tube decompression.  Surgery and GI is on board.  Patient likely need surgical intervention once electrolytes optimized. Suspecting malignancy with clinical history of weight loss, DVT, acute bowel obstruction. Although CEA can be falsely elevated in current setting, I will obtain a presurgical level as her baseline.   #Acute bilateral lower extremity DVT, recurrent, /small pulmonary emboli Agree with heparin gtt.   #Severe hypokalemia and hypo: Continue potassium drip and close monitoring of potassium levels.   Thank you for allowing me to participate in the care of this patient.  Total face to face encounter time for this patient visit was . >50% of the time was  spent in counseling and coordination of care.    Rickard Patience, MD, PhD Hematology Oncology [facility] Pager- [phone] 12/31/2017

## 2017-12-31 NOTE — ED Notes (Addendum)
Pt declined NG tube insertion

## 2017-12-31 NOTE — ED Notes (Signed)
Dr. Tonna Boehringer at bedside talking to pt

## 2017-12-31 NOTE — ED Notes (Signed)
Discussed with Dr. Anne Hahn about potassium administration rate giving pt some discomfort received orders to administer potassium to 53ml/hr

## 2017-12-31 NOTE — ED Notes (Signed)
Pt talked to Dr. Anne Hahn, pt agrees to have NG placed

## 2017-12-31 NOTE — Clinical Social Work Note (Signed)
CSW received consult for concern that patient is not attending to self-care. CSW will visit when able.  Argentina Ponder, MSW, [facility]ors [phone]

## 2017-12-31 NOTE — Consult Note (Signed)
Midge Minium, MD [facility]  [address] [place], [place] [address] Phone: [phone] Fax : [phone]  Consultation  Referring Provider:     Dr. Tonna Boehringer Primary Care Physician:  [facility], [place] [facility] Primary Gastroenterologist: Gentry Fitz         Reason for Consultation:     Colonic obstruction  Date of Admission:  01/25/2018 Date of Consultation:  12/31/2017         HPI:   Yvette Martinez is a 67 y.o. female who reports that she has had abdominal distention.  The patient has had CT scans in the past including back in June 2017 that showed thickening of the sigmoid wall which was suggested to reflect very mild diverticulitis.  In February of the same year the patient had persistent inflammation in the sigmoid descending colon and sigmoid colon consistent with diverticulitis and colonic narrowing causing a relative obstruction. The patient reports that she has never had a colonoscopy in the past.  Patient had a CT scan done on admission that showed severe distention of the colon with transition in the sigmoid with the possibility secondary to stricture and obstruction versus less likely a pseudoobstruction or ileus.  There is also minimal thickening of the distal small bowel loops.  The transverse colon measures up to 9 cm.  The patient was evaluated by surgery who then called a consult to GI for evaluation of the stricture with a colonoscopy and possible stent placement.  Past Medical History:  Diagnosis Date  . COPD (chronic obstructive pulmonary disease) (HCC)   . Diabetes mellitus without complication (HCC)   . Diverticula of colon   . DVT (deep vein thrombosis) in pregnancy   . Pneumonia     Past Surgical History:  Procedure Laterality Date  . ABDOMINAL HYSTERECTOMY    . CYSTECTOMY     patient states beind her bladder  . TONSILLECTOMY      Prior to Admission medications   Medication Sig Start Date End Date Taking? Authorizing Provider  ALPRAZolam Prudy Feeler) 1 MG  tablet Take 1 tablet by mouth every 6 (six) hours as needed for anxiety.    Yes [provider]  Fluticasone-Salmeterol (ADVAIR DISKUS) 100-50 MCG/DOSE AEPB Inhale 1 puff into the lungs 2 (two) times daily. 06/28/14  Yes [provider]  mirtazapine (REMERON) 30 MG tablet Take 30 mg by mouth at bedtime.    Yes [provider]  tiotropium (SPIRIVA) 18 MCG inhalation capsule Place 1 capsule into inhaler and inhale daily. 06/28/14  Yes [provider]  VENTOLIN HFA 108 (90 Base) MCG/ACT inhaler Inhale 1-2 puffs into the lungs every 6 (six) hours as needed for wheezing or shortness of breath.  03/26/15  Yes [provider]    Family History  Problem Relation Age of Onset  . CAD Father   . Aneurysm Father   . Diabetes Brother   . Cancer Sister   . Kidney cancer Neg Hx   . Bladder Cancer Neg Hx      Social History   Tobacco Use  . Smoking status: Current Every Day Smoker    Packs/day: 0.30    Types: Cigarettes  . Smokeless tobacco: Never Used  Substance Use Topics  . Alcohol use: No    Frequency: Never  . Drug use: No    Allergies as of 01/13/2018 - Review Complete 01/18/2018  Allergen Reaction Noted  . Penicillins Rash   . Venlafaxine Swelling   . Sulfa antibiotics Hives 04/12/2015  Review of Systems:    All systems reviewed and negative except where noted in HPI.   Physical Exam:  Vital signs in last 24 hours: Temp:  [97.7 F (36.5 C)-98.1 F (36.7 C)] 98 F (36.7 C) (11/02 1237) Pulse Rate:  [79-91] 91 (11/02 1237) Resp:  [14-20] 16 (11/02 1237) BP: (92-135)/(55-76) 110/68 (11/02 1237) SpO2:  [93 %-98 %] 96 % (11/02 1237) Weight:  [52.9 kg] 52.9 kg (11/02 0100)   General:   Pleasant, cooperative in NAD Head:  Normocephalic and atraumatic.  NG tube in place Eyes:   No icterus.   Conjunctiva pink. PERRLA. Ears:  Normal auditory acuity. Neck:  Supple; no masses or thyroidomegaly Lungs: Respirations even and unlabored.  Lungs clear to auscultation bilaterally.   No wheezes, crackles, or rhonchi.  Heart:  Regular rate and rhythm;  Without murmur, clicks, rubs or gallops Abdomen: The abdomen was distended with decreased bowel sounds and tympany with diffuse tenderness without rebound or guarding.  Hepatospleno megaly could not be assessed Rectal:  Not performed. Msk:  Symmetrical without gross deformities.    Extremities:  Without edema, cyanosis or clubbing. Neurologic:  Alert and oriented x3;  grossly normal neurologically. Skin:  Intact without significant lesions or rashes. Cervical Nodes:  No significant cervical adenopathy. Psych:  Alert and cooperative. Normal affect.  LAB RESULTS: Recent Labs    01/14/2018 2000 12/31/17 0451  WBC 12.9* 10.5  HGB 15.1* 14.9  HCT 43.1 43.0  PLT 225 196   BMET Recent Labs    12/31/2017 2036 12/31/17 0451  NA 137 138  K <2.0* <2.0*  CL 87* 88*  CO2 35* 36*  GLUCOSE 98 83  BUN 19 17  CREATININE 0.84 0.80  CALCIUM 7.7* 7.4*   LFT Recent Labs    01/14/2018 2036  PROT 5.2*  ALBUMIN 2.8*  AST 40  ALT 28  ALKPHOS 116  BILITOT 1.6*   PT/INR No results for input(s): LABPROT, INR in the last 72 hours.  STUDIES: Dg Chest 2 View  Result Date: 01/01/2018 CLINICAL DATA:  Bilateral lower extremity edema and abdominal distension. EXAM: CHEST - 2 VIEW COMPARISON:  Chest CT 06/08/2017 FINDINGS: Cardiomediastinal silhouette is normal. Mediastinal contours appear intact. Calcific atherosclerotic disease of the aorta. There is no evidence of focal airspace consolidation, pleural effusion or pneumothorax. Hyperinflation of the lungs. Mild bronchiectasis and peribronchial thickening, similar to the prior study. Osseous structures are without acute abnormality. Soft tissues are grossly normal. IMPRESSION: Mild bronchiectasis and peribronchial thickening, similar to the prior study. Hyperinflation of the lungs. No evidence of acute lobar airspace consolidation or pulmonary  edema. Electronically Signed   By: Ted Mcalpine M.D.   On: 01/22/2018 20:37   Ct Abdomen Pelvis W Contrast  Result Date: 01/11/2018 CLINICAL DATA:  67 year old female with abdominal pain. History of sigmoid diverticulitis. EXAM: CT ABDOMEN AND PELVIS WITH CONTRAST TECHNIQUE: Multidetector CT imaging of the abdomen and pelvis was performed using the standard protocol following bolus administration of intravenous contrast. CONTRAST:  75mL OMNIPAQUE IOHEXOL 300 MG/ML  SOLN COMPARISON:  Abdominal radiograph dated 01/25/2018 and CT dated 07/07/2016 FINDINGS: Lower chest: Right lung base scarring primarily involving the right middle lobe noted. The left lung base is clear. No intra-abdominal free air or free fluid. Hepatobiliary: No focal liver abnormality is seen. No gallstones, gallbladder wall thickening, or biliary dilatation. Pancreas: Unremarkable. No pancreatic ductal dilatation or surrounding inflammatory changes. Spleen: Normal in size without focal abnormality. Adrenals/Urinary Tract: Adrenal glands are unremarkable. Kidneys are  normal, without renal calculi, focal lesion, or hydronephrosis. Bladder is unremarkable. Stomach/Bowel: There is diffuse severe distention of the colon with gas and semi-solid fecal matter. The transverse colon measure up to 9 cm in diameter. The distal sigmoid and rectum are collapsed. There is a transition zone in the sigmoid colon (series 2, image 64 and coronal series 5, image 45) in the area of prior diverticulitis. Findings likely represent an obstruction secondary to stricture or adhesion, related to prior inflammatory process, although a mass is not excluded. A pseudo-obstruction or adynamic ileus are other consideration. There is no evidence of colonic twisting. There is sigmoid diverticulosis without active inflammatory changes. There is mild air distention of the distal small bowel and terminal ileum. Mildly thickened appearance of the distal small bowel loops in  the pelvis may be reactive or represent enteritis. No evidence of small-bowel obstruction. There is no pneumatosis at this time. The appendix is unremarkable. Vascular/Lymphatic: Advanced aortoiliac atherosclerotic disease. Apparent low attenuating content within the proximal right femoral vein (series 2, image 87) likely mixing artifact. O'clock is less likely. Duplex ultrasound may provide better evaluation if there is clinical concern for DVT. The SMV, splenic vein, and main portal vein are patent. No portal venous gas. There is no adenopathy. Reproductive: Hysterectomy. No pelvic mass. Other: There is midline vertical anterior pelvic wall incisional scar. Subcutaneous edema of the left thigh. Musculoskeletal: Osteopenia with degenerative changes of the spine. No acute osseous pathology. IMPRESSION: 1. Severe distention of the colon with transition at the sigmoid, possibly secondary to stricture and obstruction versus less likely a pseudo-obstruction or an ileus. Clinical correlation and surgical consult is advised. No pneumatosis or portal venous gas. 2. Mildly thickened appearance of the distal small bowel loops in the pelvis may be reactive or represent enteritis. No evidence of small-bowel obstruction. Normal appendix. 3. Artifact versus less likely a right femoral vein DVT. 4. Advanced aortoiliac atherosclerotic disease. Electronically Signed   By: Elgie Collard M.D.   On: 01/27/18 23:06   Dg Abd 2 Views  Result Date: 27-Jan-2018 CLINICAL DATA:  Distended abdomen EXAM: ABDOMEN - 2 VIEW COMPARISON:  CT 07/07/2016 FINDINGS: Markedly distended colon diffusely. Air-fluid levels right colon. Small bowel nondilated. No free air. IMPRESSION: Marked colonic distention compatible with ileus. Electronically Signed   By: Marlan Palau M.D.   On: 27-Jan-2018 20:37      Impression / Plan:   Assessment: Principal Problem:   Bowel obstruction (HCC) Active Problems:   Hypokalemia   COPD (chronic  obstructive pulmonary disease) (HCC)   Anxiety and depression   Hypomagnesemia   Yvette Martinez is a 67 y.o. y/o female with what appears to be of obstruction of the colon with a transition point in the sigmoid colon.  The patient has never had a colonoscopy in the past.  Plan:  This patient will need a colonoscopy but due to severe hypokalemia at this time the patient will not be set up for a colonoscopy until this is corrected. The patient is not a candidate for colonic stenting unless this is a inoperable colon cancer.  If this is a stricture then the treatment of choice would be for the patient to undergo surgery.  It appears that the patient has had narrowing of the colon all the way back to 2017.  I will continue to follow the patient while monitoring the potassium and when the electrolytes have been normalized and the patient will be set up for a colonoscopy.  Until then  the patient will be treated with enemas to try and relieve some of the constipation since the patient has not had a bowel movement since the beginning of October.  Thank you for involving me in the care of this patient.      LOS: 1 day   Midge Minium, MD  12/31/2017, 12:44 PM    Note: This dictation was prepared with Dragon dictation along with smaller phrase technology. Any transcriptional errors that result from this process are unintentional.

## 2018-01-01 ENCOUNTER — Encounter: Admission: EM | Disposition: E | Payer: Self-pay | Source: Home / Self Care | Attending: Internal Medicine

## 2018-01-01 ENCOUNTER — Inpatient Hospital Stay: Payer: Medicare HMO | Admitting: Anesthesiology

## 2018-01-01 ENCOUNTER — Inpatient Hospital Stay: Payer: Medicare HMO

## 2018-01-01 ENCOUNTER — Encounter: Payer: Self-pay | Admitting: Anesthesiology

## 2018-01-01 DIAGNOSIS — E119 Type 2 diabetes mellitus without complications: Secondary | ICD-10-CM

## 2018-01-01 DIAGNOSIS — J449 Chronic obstructive pulmonary disease, unspecified: Secondary | ICD-10-CM

## 2018-01-01 DIAGNOSIS — K56609 Unspecified intestinal obstruction, unspecified as to partial versus complete obstruction: Secondary | ICD-10-CM

## 2018-01-01 DIAGNOSIS — I82403 Acute embolism and thrombosis of unspecified deep veins of lower extremity, bilateral: Secondary | ICD-10-CM

## 2018-01-01 DIAGNOSIS — I2699 Other pulmonary embolism without acute cor pulmonale: Secondary | ICD-10-CM

## 2018-01-01 HISTORY — PX: LAPAROTOMY: SHX154

## 2018-01-01 HISTORY — PX: COLON RESECTION SIGMOID: SHX6737

## 2018-01-01 HISTORY — PX: IVC FILTER INSERTION: CATH118245

## 2018-01-01 HISTORY — PX: COLOSTOMY: SHX63

## 2018-01-01 LAB — BASIC METABOLIC PANEL
Anion gap: 14 (ref 5–15)
BUN: 18 mg/dL (ref 8–23)
CHLORIDE: 101 mmol/L (ref 98–111)
CO2: 25 mmol/L (ref 22–32)
CREATININE: 0.65 mg/dL (ref 0.44–1.00)
Calcium: 7.5 mg/dL — ABNORMAL LOW (ref 8.9–10.3)
GFR calc Af Amer: >60 mL/min (ref >60)
GFR calc non Af Amer: >60 mL/min (ref >60)
Glucose, Bld: 72 mg/dL (ref 70–99)
Potassium: 4.8 mmol/L (ref 3.5–5.1)
Sodium: 140 mmol/L (ref 135–145)

## 2018-01-01 LAB — BLOOD GAS, ARTERIAL
Acid-base deficit: 4.5 mmol/L — ABNORMAL HIGH (ref 0.0–2.0)
BICARBONATE: 22.6 mmol/L (ref 20.0–28.0)
FIO2: 0.4
LHR: 12 {breaths}/min
O2 Saturation: 95.6 %
PEEP/CPAP: 5 cmH2O
Patient temperature: 37
VT: 450 mL
pCO2 arterial: 48 mmHg (ref 32.0–48.0)
pH, Arterial: 7.28 — ABNORMAL LOW (ref 7.350–7.450)
pO2, Arterial: 89 mmHg (ref 83.0–108.0)

## 2018-01-01 LAB — HEPARIN LEVEL (UNFRACTIONATED): Heparin Unfractionated: 1.54 IU/mL — ABNORMAL HIGH (ref 0.30–0.70)

## 2018-01-01 LAB — CBC
HEMATOCRIT: 43.6 % (ref 36.0–46.0)
Hemoglobin: 14.7 g/dL (ref 12.0–15.0)
MCH: 32.8 pg (ref 26.0–34.0)
MCHC: 33.7 g/dL (ref 30.0–36.0)
MCV: 97.3 fL (ref 80.0–100.0)
NRBC: 0 % (ref 0.0–0.2)
PLATELETS: 216 10*3/uL (ref 150–400)
RBC: 4.48 MIL/uL (ref 3.87–5.11)
RDW: 14.6 % (ref 11.5–15.5)
WBC: 10.9 10*3/uL — ABNORMAL HIGH (ref 4.0–10.5)

## 2018-01-01 LAB — ABO/RH: ABO/RH(D): A NEG

## 2018-01-01 LAB — MRSA PCR SCREENING: MRSA by PCR: NEGATIVE

## 2018-01-01 SURGERY — IVC FILTER INSERTION
Anesthesia: Moderate Sedation

## 2018-01-01 SURGERY — LAPAROTOMY, EXPLORATORY
Anesthesia: General | Site: Abdomen

## 2018-01-01 MED ORDER — MIDAZOLAM HCL 2 MG/2ML IJ SOLN
INTRAMUSCULAR | Status: DC | PRN
  Administered 2018-01-01: 1 mg via INTRAVENOUS

## 2018-01-01 MED ORDER — LIDOCAINE-EPINEPHRINE (PF) 1 %-1:200000 IJ SOLN
INTRAMUSCULAR | Status: AC
  Filled 2018-01-01: qty 30

## 2018-01-01 MED ORDER — CIPROFLOXACIN IN D5W 400 MG/200ML IV SOLN
INTRAVENOUS | Status: DC | PRN
  Administered 2018-01-01: 400 mg via INTRAVENOUS

## 2018-01-01 MED ORDER — SODIUM CHLORIDE 0.9 % IR SOLN
Status: DC | PRN
  Administered 2018-01-01: 3000 mL

## 2018-01-01 MED ORDER — MIDAZOLAM HCL 2 MG/2ML IJ SOLN
INTRAMUSCULAR | Status: AC
  Filled 2018-01-01: qty 2

## 2018-01-01 MED ORDER — BUPIVACAINE HCL (PF) 0.25 % IJ SOLN
INTRAMUSCULAR | Status: AC
  Filled 2018-01-01: qty 30

## 2018-01-01 MED ORDER — METRONIDAZOLE IN NACL 5-0.79 MG/ML-% IV SOLN
500.0000 mg | INTRAVENOUS | Status: DC
  Filled 2018-01-01: qty 100

## 2018-01-01 MED ORDER — SENNOSIDES 8.8 MG/5ML PO SYRP: 5.0000 mL | ORAL_SOLUTION | Freq: Two times a day (BID) | ORAL | Status: DC | PRN

## 2018-01-01 MED ORDER — CHLORHEXIDINE GLUCONATE 0.12% ORAL RINSE (MEDLINE KIT)
15.0000 mL | Freq: Two times a day (BID) | OROMUCOSAL | Status: DC
  Administered 2018-01-01 – 2018-01-02 (×3): 15 mL via OROMUCOSAL

## 2018-01-01 MED ORDER — FENTANYL 2500MCG IN NS 250ML (10MCG/ML) PREMIX INFUSION
INTRAVENOUS | Status: AC
  Administered 2018-01-01: 75 ug/h via INTRAVENOUS
  Filled 2018-01-01: qty 250

## 2018-01-01 MED ORDER — ALBUMIN HUMAN 5 % IV SOLN
12.5000 g | Freq: Once | INTRAVENOUS | Status: DC
  Filled 2018-01-01: qty 250

## 2018-01-01 MED ORDER — FENTANYL 2500MCG IN NS 250ML (10MCG/ML) PREMIX INFUSION
25.0000 ug/h | INTRAVENOUS | Status: DC
  Administered 2018-01-01: 75 ug/h via INTRAVENOUS
  Filled 2018-01-01: qty 250

## 2018-01-01 MED ORDER — FENTANYL CITRATE (PF) 100 MCG/2ML IJ SOLN
INTRAMUSCULAR | Status: AC
  Filled 2018-01-01: qty 2

## 2018-01-01 MED ORDER — SODIUM CHLORIDE 0.9 % IV SOLN
INTRAVENOUS | Status: DC
  Administered 2018-01-01 – 2018-01-02 (×4): via INTRAVENOUS

## 2018-01-01 MED ORDER — SODIUM CHLORIDE FLUSH 0.9 % IV SOLN
INTRAVENOUS | Status: AC
  Filled 2018-01-01: qty 50

## 2018-01-01 MED ORDER — LIDOCAINE HCL (CARDIAC) PF 100 MG/5ML IV SOSY
PREFILLED_SYRINGE | INTRAVENOUS | Status: DC | PRN
  Administered 2018-01-01: 80 mg via INTRAVENOUS

## 2018-01-01 MED ORDER — BISACODYL 10 MG RE SUPP: 10.0000 mg | Freq: Every day | RECTAL | Status: DC | PRN

## 2018-01-01 MED ORDER — CLINDAMYCIN PHOSPHATE 300 MG/50ML IV SOLN
INTRAVENOUS | Status: AC
  Filled 2018-01-01: qty 50

## 2018-01-01 MED ORDER — METRONIDAZOLE IN NACL 5-0.79 MG/ML-% IV SOLN
INTRAVENOUS | Status: DC | PRN
  Administered 2018-01-01: 500 mg via INTRAVENOUS

## 2018-01-01 MED ORDER — SUCCINYLCHOLINE CHLORIDE 20 MG/ML IJ SOLN
INTRAMUSCULAR | Status: DC | PRN
  Administered 2018-01-01: 80 mg via INTRAVENOUS

## 2018-01-01 MED ORDER — IPRATROPIUM-ALBUTEROL 0.5-2.5 (3) MG/3ML IN SOLN
3.0000 mL | Freq: Four times a day (QID) | RESPIRATORY_TRACT | Status: DC
  Administered 2018-01-02 (×4): 3 mL via RESPIRATORY_TRACT
  Filled 2018-01-01 (×4): qty 3

## 2018-01-01 MED ORDER — ALBUMIN HUMAN 5 % IV SOLN
INTRAVENOUS | Status: DC | PRN
  Administered 2018-01-01 (×2): via INTRAVENOUS

## 2018-01-01 MED ORDER — BUPIVACAINE LIPOSOME 1.3 % IJ SUSP
INTRAMUSCULAR | Status: AC
  Filled 2018-01-01: qty 20

## 2018-01-01 MED ORDER — MIDAZOLAM HCL 2 MG/2ML IJ SOLN: 1.0000 mg | INTRAMUSCULAR | Status: DC | PRN

## 2018-01-01 MED ORDER — ROCURONIUM BROMIDE 100 MG/10ML IV SOLN
INTRAVENOUS | Status: DC | PRN
  Administered 2018-01-01: 50 mg via INTRAVENOUS
  Administered 2018-01-01: 20 mg via INTRAVENOUS
  Administered 2018-01-01: 30 mg via INTRAVENOUS
  Administered 2018-01-01: 50 mg via INTRAVENOUS

## 2018-01-01 MED ORDER — HEPARIN (PORCINE) IN NACL 100-0.45 UNIT/ML-% IJ SOLN
550.0000 [IU]/h | INTRAMUSCULAR | Status: DC
  Administered 2018-01-01: 550 [IU]/h via INTRAVENOUS
  Filled 2018-01-01: qty 250

## 2018-01-01 MED ORDER — PROPOFOL 10 MG/ML IV BOLUS
INTRAVENOUS | Status: DC | PRN
  Administered 2018-01-01: 100 mg via INTRAVENOUS

## 2018-01-01 MED ORDER — SEVOFLURANE IN SOLN
RESPIRATORY_TRACT | Status: AC
  Filled 2018-01-01: qty 250

## 2018-01-01 MED ORDER — FENTANYL CITRATE (PF) 100 MCG/2ML IJ SOLN
INTRAMUSCULAR | Status: DC | PRN
  Administered 2018-01-01: 25 ug via INTRAVENOUS

## 2018-01-01 MED ORDER — FENTANYL CITRATE (PF) 100 MCG/2ML IJ SOLN
50.0000 ug | Freq: Once | INTRAMUSCULAR | Status: AC
  Administered 2018-01-01: 50 ug via INTRAVENOUS

## 2018-01-01 MED ORDER — CLINDAMYCIN PHOSPHATE 300 MG/50ML IV SOLN
300.0000 mg | Freq: Once | INTRAVENOUS | Status: AC
  Administered 2018-01-01: 300 mg via INTRAVENOUS

## 2018-01-01 MED ORDER — FENTANYL CITRATE (PF) 100 MCG/2ML IJ SOLN
INTRAMUSCULAR | Status: DC | PRN
  Administered 2018-01-01 (×2): 50 ug via INTRAVENOUS

## 2018-01-01 MED ORDER — CIPROFLOXACIN IN D5W 400 MG/200ML IV SOLN
400.0000 mg | INTRAVENOUS | Status: DC
  Filled 2018-01-01: qty 200

## 2018-01-01 MED ORDER — ORAL CARE MOUTH RINSE
15.0000 mL | OROMUCOSAL | Status: DC
  Administered 2018-01-01 – 2018-01-02 (×6): 15 mL via OROMUCOSAL

## 2018-01-01 MED ORDER — LACTATED RINGERS IV SOLN
INTRAVENOUS | Status: DC | PRN
  Administered 2018-01-01 (×2): via INTRAVENOUS

## 2018-01-01 MED ORDER — FAMOTIDINE IN NACL 20-0.9 MG/50ML-% IV SOLN
20.0000 mg | Freq: Two times a day (BID) | INTRAVENOUS | Status: DC
  Administered 2018-01-01 – 2018-01-02 (×2): 20 mg via INTRAVENOUS
  Filled 2018-01-01 (×2): qty 50

## 2018-01-01 MED ORDER — FENTANYL BOLUS VIA INFUSION
25.0000 ug | INTRAVENOUS | Status: DC | PRN
  Filled 2018-01-01: qty 25

## 2018-01-01 MED ORDER — LACTATED RINGERS IV SOLN
INTRAVENOUS | Status: DC | PRN
  Administered 2018-01-01 (×2): via INTRAVENOUS

## 2018-01-01 SURGICAL SUPPLY — 50 items
BARRIER SKIN 2 1/4 (WOUND CARE) ×4 IMPLANT
BARRIER SKIN 2 1/4INCH (WOUND CARE) ×1
BULB RESERV EVAC DRAIN JP 100C (MISCELLANEOUS) ×5 IMPLANT
CANISTER SUCT 1200ML W/VALVE (MISCELLANEOUS) ×5 IMPLANT
CANISTER SUCT 3000ML (MISCELLANEOUS) ×5 IMPLANT
CHLORAPREP W/TINT 26ML (MISCELLANEOUS) ×5 IMPLANT
COVER WAND RF STERILE (DRAPES) IMPLANT
DRAIN CHANNEL JP 15F RND 16 (MISCELLANEOUS) ×5 IMPLANT
DRAPE LAPAROTOMY 100X77 ABD (DRAPES) ×5 IMPLANT
DRSG OPSITE POSTOP 4X10 (GAUZE/BANDAGES/DRESSINGS) IMPLANT
DRSG OPSITE POSTOP 4X8 (GAUZE/BANDAGES/DRESSINGS) ×5 IMPLANT
DRSG TEGADERM 4X10 (GAUZE/BANDAGES/DRESSINGS) ×5 IMPLANT
DRSG TELFA 3X8 NADH (GAUZE/BANDAGES/DRESSINGS) ×5 IMPLANT
ELECT REM PT RETURN 9FT ADLT (ELECTROSURGICAL) ×5
ELECTRODE REM PT RTRN 9FT ADLT (ELECTROSURGICAL) ×3 IMPLANT
GAUZE SPONGE 4X4 16PLY XRAY LF (GAUZE/BANDAGES/DRESSINGS) ×5 IMPLANT
GLOVE BIOGEL PI IND STRL 7.0 (GLOVE) ×12 IMPLANT
GLOVE BIOGEL PI INDICATOR 7.0 (GLOVE) ×8
GLOVE SURG SYN 7.0 (GLOVE) ×40 IMPLANT
GOWN STRL REUS W/ TWL LRG LVL3 (GOWN DISPOSABLE) ×18 IMPLANT
GOWN STRL REUS W/TWL LRG LVL3 (GOWN DISPOSABLE) ×12
KIT TURNOVER KIT A (KITS) ×5 IMPLANT
LABEL OR SOLS (LABEL) ×5 IMPLANT
LIGASURE IMPACT 36 18CM CVD LR (INSTRUMENTS) ×5 IMPLANT
LIGHT WAVEGUIDE WIDE FLAT (MISCELLANEOUS) ×5 IMPLANT
NS IRRIG 1000ML POUR BTL (IV SOLUTION) ×5 IMPLANT
PACK BASIN MAJOR ARMC (MISCELLANEOUS) ×5 IMPLANT
PACK COLON CLEAN CLOSURE (MISCELLANEOUS) ×5 IMPLANT
RELOAD PROXIMATE 75MM GREEN (ENDOMECHANICALS) ×5 IMPLANT
RETAINER VISCERA MED (MISCELLANEOUS) ×5 IMPLANT
SPONGE LAP 18X18 RF (DISPOSABLE) ×10 IMPLANT
STAPLER CUT CVD 40MM GREEN (STAPLE) ×5 IMPLANT
STAPLER PROXIMATE 75MM BLUE (STAPLE) ×5 IMPLANT
SUT CHROMIC 3 0 SH 27 (SUTURE) ×5 IMPLANT
SUT ETHILON 3-0 (SUTURE) ×5 IMPLANT
SUT PDS AB 0 CT1 27 (SUTURE) ×10 IMPLANT
SUT PDS AB 1 TP1 54 (SUTURE) IMPLANT
SUT SILK 2 0 (SUTURE) ×2
SUT SILK 2-0 18XBRD TIE 12 (SUTURE) ×3 IMPLANT
SUT SILK 3 0 (SUTURE) ×8
SUT SILK 3-0 (SUTURE) ×5 IMPLANT
SUT SILK 3-0 18XBRD TIE 12 (SUTURE) ×12 IMPLANT
SUT VIC AB 3-0 54X BRD REEL (SUTURE) ×3 IMPLANT
SUT VIC AB 3-0 BRD 54 (SUTURE) ×2
SUT VIC AB 3-0 SH 27 (SUTURE) ×6
SUT VIC AB 3-0 SH 27X BRD (SUTURE) ×9 IMPLANT
SUT VIC AB 3-0 SH 8-18 (SUTURE) ×10 IMPLANT
SUT VICRYL 0 AB UR-6 (SUTURE) ×5 IMPLANT
TOWEL OR 17X26 4PK STRL BLUE (TOWEL DISPOSABLE) ×5 IMPLANT
TRAY FOLEY MTR SLVR 16FR STAT (SET/KITS/TRAYS/PACK) ×5 IMPLANT

## 2018-01-01 SURGICAL SUPPLY — 4 items
CANNULA 5F STIFF (CANNULA) ×3 IMPLANT
FILTER VC CELECT-FEMORAL (Filter) ×3 IMPLANT
PACK ANGIOGRAPHY (CUSTOM PROCEDURE TRAY) ×3 IMPLANT
WIRE J 3MM .035X145CM (WIRE) ×3 IMPLANT

## 2018-01-01 NOTE — Progress Notes (Deleted)
Wrong patient

## 2018-01-01 NOTE — Progress Notes (Signed)
Initial Nutrition Assessment  DOCUMENTATION CODES:   Not applicable  INTERVENTION:  Will continue to monitor for plan of care and diet advancement. RD will recommend appropriate intervention pending diet advancement.  NUTRITION DIAGNOSIS:   Inadequate oral intake related to inability to eat as evidenced by NPO status.  GOAL:   Patient will meet greater than or equal to 90% of their needs  MONITOR:   Diet advancement, Labs, Weight trends, I & O's  REASON FOR ASSESSMENT:   Malnutrition Screening Tool    ASSESSMENT:   67 year old female with PMHx of diverticulosis, COPD, hx DVT, DM who is admitted with sigmoid colon obstruction (mass vs stricture from previous diverticulitis).   Unable to meet with patient as she was at a procedure. No family members in room. RD will obtain nutrition/weight history and NFPE on follow-up. Per chart patient has 16 Fr. NGT in right nare to LIS.  Per chart patient was 55.4 kg on 02/21/2017. She is currently 52.9 kg (116.62 lbs). She has lost 2.5 kg (4.5% body weight) sometime over the past year. Will clarify weight history on follow-up.  Medications reviewed and include: NS @ 50 mL/hr, clindamycin.  Labs reviewed.  NUTRITION - FOCUSED PHYSICAL EXAM:  Will obtain NFPE on follow-up.  Diet Order:   Diet Order            Diet NPO time specified Except for: Sips with Meds  Diet effective now              EDUCATION NEEDS:   No education needs have been identified at this time  Skin:  Skin Assessment: Reviewed RN Assessment(ecchymosis)  Last BM:  01/03/2018 - small type 2  Height:   Ht Readings from Last 1 Encounters:  12/31/17 5\' 2"  (1.575 m)    Weight:   Wt Readings from Last 1 Encounters:  12/31/17 52.9 kg    Ideal Body Weight:  50 kg  BMI:  Body mass index is 21.33 kg/m.  Estimated Nutritional Needs:   Kcal:  1315-1520 (MSJ x 1.3-1.5)  Protein:  70-80 grams (1.3-1.5 grams/kg)  Fluid:  1.5 L/day  Helane Rima, MS, RD, LDN Office: [phone] Pager: [phone] After Hours/Weekend Pager: [phone]

## 2018-01-01 NOTE — Progress Notes (Signed)
Verbal order from MD to not give fleet enema at this time due to multiple bowels movements after fleet enema was started yesterday.

## 2018-01-01 NOTE — Progress Notes (Signed)
Yvette Martinez, gave RN telemetry box that was on pt prior to surgery. Box 40-53. Central telemetry notified that pt is currently in the OR and telemetry box 40-53 is not on pt. RN will leave order in for when pt is out of surgery.   Yvette Martinez

## 2018-01-01 NOTE — Progress Notes (Signed)
Hematology/Oncology Progress Note [facility] [phone] [phone]  Patient Care Team: [facility], [facility] as PCP - General (Family Medicine)   Name of the patient: Yvette Martinez  [id number]  08-23-1950  Date of visit: 01/11/2018   INTERVAL HISTORY-  Patient appears more lethargic today.  Continue have abdomen distention.  No family members at bedside.    Review of systems- Review of Systems  Unable to perform ROS: Mental status change    Allergies  Allergen Reactions  . Penicillins Rash and Other (See Comments)    Has patient had a PCN reaction causing immediate rash, facial/tongue/throat swelling, SOB or lightheadedness with hypotension: Unknown Has patient had a PCN reaction causing severe rash involving mucus membranes or skin necrosis: Unknown Has patient had a PCN reaction that required hospitalization: No Has patient had a PCN reaction occurring within the last 10 years: No If all of the above answers are "NO", then may proceed with Cephalosporin use.   . Venlafaxine Swelling  . Sulfa Antibiotics Hives    Patient Active Problem List   Diagnosis Date Noted  . Abdominal distention   . Acute deep vein thrombosis (DVT) of femoral vein of both lower extremities (HCC)   . Bowel obstruction (HCC) 01/13/2018  . Hypomagnesemia 01/15/2018  . Salmonella gastroenteritis 07/10/2016  . Hypokalemia 07/10/2016  . Hypoglycemia 07/10/2016  . Tobacco dependence 07/10/2016  . COPD (chronic obstructive pulmonary disease) (HCC) 07/10/2016  . Anxiety and depression 07/10/2016  . Diverticulitis 07/07/2016  . Acute respiratory failure with hypoxia (HCC) 08/23/2015  . COPD exacerbation (HCC) 08/23/2015  . Diverticulitis of large intestine with perforation without bleeding 08/23/2015  . Diarrhea 08/23/2015  . Generalized weakness 08/23/2015  . Tobacco abuse 08/23/2015  . CAP (community acquired pneumonia) 08/20/2015     Past Medical  History:  Diagnosis Date  . COPD (chronic obstructive pulmonary disease) (HCC)   . Diabetes mellitus without complication (HCC)   . Diverticula of colon   . DVT (deep vein thrombosis) in pregnancy   . Pneumonia      Past Surgical History:  Procedure Laterality Date  . ABDOMINAL HYSTERECTOMY    . CYSTECTOMY     patient states beind her bladder  . TONSILLECTOMY      Social History   Socioeconomic History  . Marital status: Divorced    Spouse name: Not on file  . Number of children: Not on file  . Years of education: Not on file  . Highest education level: Not on file  Occupational History  . Not on file  Social Needs  . Financial resource strain: Not on file  . Food insecurity:    Worry: Not on file    Inability: Not on file  . Transportation needs:    Medical: Not on file    Non-medical: Not on file  Tobacco Use  . Smoking status: Current Every Day Smoker    Packs/day: 0.30    Types: Cigarettes  . Smokeless tobacco: Never Used  Substance and Sexual Activity  . Alcohol use: No    Frequency: Never  . Drug use: No  . Sexual activity: Not on file  Lifestyle  . Physical activity:    Days per week: Not on file    Minutes per session: Not on file  . Stress: Not on file  Relationships  . Social connections:    Talks on phone: Not on file    Gets together: Not on file    Attends religious service:  Not on file    Active member of club or organization: Not on file    Attends meetings of clubs or organizations: Not on file    Relationship status: Not on file  . Intimate partner violence:    Fear of current or ex partner: Not on file    Emotionally abused: Not on file    Physically abused: Not on file    Forced sexual activity: Not on file  Other Topics Concern  . Not on file  Social History Narrative  . Not on file     Family History  Problem Relation Age of Onset  . CAD Father   . Aneurysm Father   . Diabetes Brother   . Cancer Sister   . Kidney cancer  Neg Hx   . Bladder Cancer Neg Hx      Current Facility-Administered Medications:  .  0.9 %  sodium chloride infusion, , Intravenous, Continuous, Altamese Dilling, MD, Last Rate: 50 mL/hr at Jan 26, 2018 1337 .  acetaminophen (TYLENOL) tablet 650 mg, 650 mg, Oral, Q6H PRN, 650 mg at 01/26/2018 0029 **OR** acetaminophen (TYLENOL) suppository 650 mg, 650 mg, Rectal, Q6H PRN, Oralia Manis, MD .  ALPRAZolam Prudy Feeler) tablet 1 mg, 1 mg, Oral, Q6H PRN, Sakai, Isami, DO, 1 mg at 2018-01-26 0828 .  mometasone-formoterol (DULERA) 100-5 MCG/ACT inhaler 2 puff, 2 puff, Inhalation, BID, Oralia Manis, MD, 2 puff at 12/31/17 2207 .  prochlorperazine (COMPAZINE) injection 5 mg, 5 mg, Intravenous, Q4H PRN, Oralia Manis, MD, 5 mg at 26-Jan-2018 9147 .  tiotropium (SPIRIVA) inhalation capsule ([facility] use ONLY) 18 mcg, 1 capsule, Inhalation, Daily, Oralia Manis, MD, 18 mcg at 12/31/17 1708   Physical exam:  Vitals:   12/31/17 1237 12/31/17 2012 26-Jan-2018 0411 26-Jan-2018 1341  BP: 110/68 118/73 122/82 111/73  Pulse: 91 97 91 93  Resp: 16 20 20 18   Temp: 98 F (36.7 C) 98.2 F (36.8 C) 98.2 F (36.8 C)   TempSrc: Oral Oral Oral   SpO2: 96% 96% 94% 95%  Weight:      Height:       Physical Exam  Constitutional: No distress.  HENT:  Head: Normocephalic and atraumatic.  NG tube in place  Eyes: Pupils are equal, round, and reactive to light. EOM are normal.  Neck: Neck supple.  Cardiovascular: Normal rate.  Pulmonary/Chest: Effort normal. No respiratory distress.  Abdominal: Soft. She exhibits distension. There is no guarding.  Hyperactive bowel sounds  Musculoskeletal: Normal range of motion. She exhibits edema.  Bilateral lower extremity edema  Neurological: She is alert.  Skin: Skin is dry.  Ecchymosis on lower extremity and right upper chest wall  Psychiatric: Affect normal.    CMP Latest Ref Rng & Units January 26, 2018  Glucose 70 - 99 mg/dL 72  BUN 8 - 23 mg/dL 18  Creatinine 8.29 - 5.62 mg/dL 1.30    Sodium 865 - 784 mmol/L 140  Potassium 3.5 - 5.1 mmol/L 4.8  Chloride 98 - 111 mmol/L 101  CO2 22 - 32 mmol/L 25  Calcium 8.9 - 10.3 mg/dL 7.5(L)  Total Protein 6.5 - 8.1 g/dL -  Total Bilirubin 0.3 - 1.2 mg/dL -  Alkaline Phos 38 - 696 U/L -  AST 15 - 41 U/L -  ALT 0 - 44 U/L -   CBC Latest Ref Rng & Units 2018/01/26  WBC 4.0 - 10.5 K/uL 10.9(H)  Hemoglobin 12.0 - 15.0 g/dL 29.5  Hematocrit 28.4 - 46.0 % 43.6  Platelets 150 - 400  K/uL 216    RADIOGRAPHIC STUDIES: I have personally reviewed the radiological images as listed and agreed with the findings in the report.  Dg Chest 2 View  Result Date: 2018-01-20 CLINICAL DATA:  Bilateral lower extremity edema and abdominal distension. EXAM: CHEST - 2 VIEW COMPARISON:  Chest CT 06/08/2017 FINDINGS: Cardiomediastinal silhouette is normal. Mediastinal contours appear intact. Calcific atherosclerotic disease of the aorta. There is no evidence of focal airspace consolidation, pleural effusion or pneumothorax. Hyperinflation of the lungs. Mild bronchiectasis and peribronchial thickening, similar to the prior study. Osseous structures are without acute abnormality. Soft tissues are grossly normal. IMPRESSION: Mild bronchiectasis and peribronchial thickening, similar to the prior study. Hyperinflation of the lungs. No evidence of acute lobar airspace consolidation or pulmonary edema. Electronically Signed   By: Ted Mcalpine M.D.   On: 20-Jan-2018 20:37   Dg Abd 1 View  Result Date: 01/07/2018 CLINICAL DATA:  Bowel obstruction.  Abdominal pain and discomfort. EXAM: ABDOMEN - 1 VIEW COMPARISON:  CT of the abdomen and pelvis 01-20-2018 FINDINGS: Marked dilation of the colon is again noted. There is no significant interval change. A J-tube is coiled in the stomach. No free air is present. IMPRESSION: Stable distal colonic obstruction. Electronically Signed   By: Marin Roberts M.D.   On: 01/03/2018 10:55   Ct Angio Chest Pe W Or Wo  Contrast  Result Date: 12/31/2017 CLINICAL DATA:  History of deep venous thrombosis and difficulty breathing. EXAM: CT ANGIOGRAPHY CHEST WITH CONTRAST TECHNIQUE: Multidetector CT imaging of the chest was performed using the standard protocol during bolus administration of intravenous contrast. Multiplanar CT image reconstructions and MIPs were obtained to evaluate the vascular anatomy. CONTRAST:  75mL ISOVUE-370 IOPAMIDOL (ISOVUE-370) INJECTION 76% COMPARISON:  None. FINDINGS: Cardiovascular: Thoracic aorta is within normal limits without aneurysmal dilatation or dissection. No cardiac enlargement is seen. Mild coronary calcifications are noted. Pulmonary artery is well visualized within normal branching pattern. Few small filling defects are noted within the left upper lobe pulmonary arterial branches consistent with pulmonary emboli. No right heart strain is seen. No significant central emboli are seen. Mediastinum/Nodes: Nasogastric catheter is identified extending into the stomach are present. The esophagus is within normal limits. No hilar or mediastinal adenopathy is noted. The thoracic inlet is unremarkable. Lungs/Pleura: Lungs are aerated bilaterally without evidence of focal infiltrate or sizable effusion. Mild mucous plugging is noted in the right lower lobe without significant consolidation or collapse. No sizable parenchymal nodules are noted. Upper Abdomen: Visualized upper abdomen demonstrates significant fecal material throughout the colon. This is similar to that seen on prior CT examination. Musculoskeletal: Degenerative changes of the thoracic spine are noted. Review of the MIP images confirms the above findings. IMPRESSION: Small pulmonary emboli within the left upper lobe pulmonary arterial branches without right heart strain. Persistent changes of constipation similar to that seen on recent CT of the abdomen. Mild mucous plugging without significant consolidation. These results will be called  to the ordering clinician or representative by the Radiologist Assistant, and communication documented in the PACS or zVision Dashboard. Electronically Signed   By: Alcide Clever M.D.   On: 12/31/2017 15:37   Ct Abdomen Pelvis W Contrast  Result Date: January 20, 2018 CLINICAL DATA:  67 year old female with abdominal pain. History of sigmoid diverticulitis. EXAM: CT ABDOMEN AND PELVIS WITH CONTRAST TECHNIQUE: Multidetector CT imaging of the abdomen and pelvis was performed using the standard protocol following bolus administration of intravenous contrast. CONTRAST:  75mL OMNIPAQUE IOHEXOL 300 MG/ML  SOLN COMPARISON:  Abdominal radiograph dated 01/05/2018 and CT dated 07/07/2016 FINDINGS: Lower chest: Right lung base scarring primarily involving the right middle lobe noted. The left lung base is clear. No intra-abdominal free air or free fluid. Hepatobiliary: No focal liver abnormality is seen. No gallstones, gallbladder wall thickening, or biliary dilatation. Pancreas: Unremarkable. No pancreatic ductal dilatation or surrounding inflammatory changes. Spleen: Normal in size without focal abnormality. Adrenals/Urinary Tract: Adrenal glands are unremarkable. Kidneys are normal, without renal calculi, focal lesion, or hydronephrosis. Bladder is unremarkable. Stomach/Bowel: There is diffuse severe distention of the colon with gas and semi-solid fecal matter. The transverse colon measure up to 9 cm in diameter. The distal sigmoid and rectum are collapsed. There is a transition zone in the sigmoid colon (series 2, image 64 and coronal series 5, image 45) in the area of prior diverticulitis. Findings likely represent an obstruction secondary to stricture or adhesion, related to prior inflammatory process, although a mass is not excluded. A pseudo-obstruction or adynamic ileus are other consideration. There is no evidence of colonic twisting. There is sigmoid diverticulosis without active inflammatory changes. There is mild air  distention of the distal small bowel and terminal ileum. Mildly thickened appearance of the distal small bowel loops in the pelvis may be reactive or represent enteritis. No evidence of small-bowel obstruction. There is no pneumatosis at this time. The appendix is unremarkable. Vascular/Lymphatic: Advanced aortoiliac atherosclerotic disease. Apparent low attenuating content within the proximal right femoral vein (series 2, image 87) likely mixing artifact. O'clock is less likely. Duplex ultrasound may provide better evaluation if there is clinical concern for DVT. The SMV, splenic vein, and main portal vein are patent. No portal venous gas. There is no adenopathy. Reproductive: Hysterectomy. No pelvic mass. Other: There is midline vertical anterior pelvic wall incisional scar. Subcutaneous edema of the left thigh. Musculoskeletal: Osteopenia with degenerative changes of the spine. No acute osseous pathology. IMPRESSION: 1. Severe distention of the colon with transition at the sigmoid, possibly secondary to stricture and obstruction versus less likely a pseudo-obstruction or an ileus. Clinical correlation and surgical consult is advised. No pneumatosis or portal venous gas. 2. Mildly thickened appearance of the distal small bowel loops in the pelvis may be reactive or represent enteritis. No evidence of small-bowel obstruction. Normal appendix. 3. Artifact versus less likely a right femoral vein DVT. 4. Advanced aortoiliac atherosclerotic disease. Electronically Signed   By: Elgie Collard M.D.   On: 01/01/2018 23:06   US Venous Img Lower Bilateral  Result Date: 12/31/2017 CLINICAL DATA:  Bilateral lower extremity swelling for 1 week EXAM: BILATERAL LOWER EXTREMITY VENOUS DUPLEX ULTRASOUND TECHNIQUE: Doppler venous assessment of the bilateral lower extremity deep venous system was performed, including characterization of spectral flow, compressibility, and phasicity. COMPARISON:  None. FINDINGS: Right lower  extremity: There is nonocclusive thrombus in the proximal right femoral vein. It is partially occlusive. The common femoral vein, mid right femoral vein, distal right femoral vein, and right popliteal vein are compressible. Doppler analysis demonstrates respiratory phasicity. Augmentation was deferred. Left lower extremity: There is complete compressibility of the left common femoral and femoral veins. The left popliteal vein is partially occlusive and contains partially occlusive thrombus. Doppler analysis demonstrates respiratory phasicity. Augmentation was deferred. IMPRESSION: The study is positive for bilateral DVT. There is partially occlusive thrombus in the proximal right femoral vein and partially occlusive thrombus in the left popliteal vein. Electronically Signed   By: Jolaine Click M.D.   On: 12/31/2017 13:30   Dg Abd 2 Views  Result Date: 21-Jan-2018 CLINICAL DATA:  Distended abdomen EXAM: ABDOMEN - 2 VIEW COMPARISON:  CT 07/07/2016 FINDINGS: Markedly distended colon diffusely. Air-fluid levels right colon. Small bowel nondilated. No free air. IMPRESSION: Marked colonic distention compatible with ileus. Electronically Signed   By: Marlan Palau M.D.   On: 01-21-18 20:37    Assessment and plan-  Patient is a 67 y.o. female who presents to emergency room for evaluation of abdominal distention and lower extremity swelling.  Was found to have colon obstruction with a transition point at the sigmoid, severe hypokalemia.  #Severe hypokalemia, potassium normalized. #: Bowel obstruction, continue supportive care.  N.p.o., NG tube decompression.  Surgery and GI on board.  Planning for surgical intervention  #Acute bilateral lower extremity DVT, recurrent, small pulmonary emboli without right heart strain Currently on heparin drip.  Patient condition appears worse from yesterday, more distended, high risk for bowel perforation. I had a phone discussion with Dr. Tonna Boehringer.  Patient needs surgical  intervention which will need temporarily holding her anticoagulation.  Patient is critically ill, and I agree with Dr. Tonna Boehringer that patient has a higher risk dying from bowel perforation if not able to get a surgical intervention then dying from her current pulmonary embolism which has a small clot burden without right heart strain.  Agree with consulting vascular surgeon for filter placement to prevent large PE.    Thank you for allowing me to participate in the care of this patient.    Rickard Patience, MD, PhD Hematology Oncology [facility] Pager- [phone] 01/28/2018

## 2018-01-01 NOTE — Anesthesia Post-op Follow-up Note (Signed)
Anesthesia QCDR form completed.        

## 2018-01-01 NOTE — Consult Note (Signed)
Cardiology Consultation:   Patient ID: Yvette Martinez MRN: [id number]; DOB: 01/13/1951  Admit date: 01/19/2018 Date of Consult: 01/25/2018  Primary Care Provider: [facility] [place] [facility] Primary Cardiologist: No primary care provider on file.  Primary Electrophysiologist:  None    Patient Profile:   Yvette Martinez is a 67 y.o. female with a hx of DVT/PE who is being seen today for preoperative cardiovascular evaluation at the request of Dr Tonna Boehringer.  History of Present Illness:   Yvette Martinez has no past history of cardiac problems.  However, she does have a history of lower extremity DVT.  She presents now with acute abdominal pain and distention and is found to have bowel obstruction requiring urgent surgery.  As part of her evaluation she is noted to have bilateral DVT with partially occlusive thrombus in both the proximal right femoral vein and left popliteal vein.  She underwent a CT angiogram of the chest demonstrating small bilateral pulmonary emboli without RV dilatation.  The patient has just returned from IVC filter placement.  She has an NG tube in place.  She denies any chest pain.  She denies any history of cardiac problems.  She denies any history of coronary disease, cardiac catheterization, or congestive heart failure.  She does admit to chronic shortness of breath that she relates to COPD.  She states that her health has been poor and she is not physically active.  She is very weak and does not get around much.  She denies history of heart palpitations or syncope.  Past Medical History:  Diagnosis Date  . COPD (chronic obstructive pulmonary disease) (HCC)   . Diabetes mellitus without complication (HCC)   . Diverticula of colon   . DVT (deep vein thrombosis) in pregnancy   . Pneumonia     Past Surgical History:  Procedure Laterality Date  . ABDOMINAL HYSTERECTOMY    . CYSTECTOMY     patient states beind her bladder  . TONSILLECTOMY       Home Medications:  Prior  to Admission medications   Medication Sig Start Date End Date Taking? Authorizing Provider  ALPRAZolam Prudy Feeler) 1 MG tablet Take 1 tablet by mouth every 6 (six) hours as needed for anxiety.    Yes [provider]  Fluticasone-Salmeterol (ADVAIR DISKUS) 100-50 MCG/DOSE AEPB Inhale 1 puff into the lungs 2 (two) times daily. 06/28/14  Yes [provider]  mirtazapine (REMERON) 30 MG tablet Take 30 mg by mouth at bedtime.    Yes [provider]  tiotropium (SPIRIVA) 18 MCG inhalation capsule Place 1 capsule into inhaler and inhale daily. 06/28/14  Yes [provider]  VENTOLIN HFA 108 (90 Base) MCG/ACT inhaler Inhale 1-2 puffs into the lungs every 6 (six) hours as needed for wheezing or shortness of breath.  03/26/15  Yes [provider]    Inpatient Medications: Scheduled Meds: . mometasone-formoterol  2 puff Inhalation BID  . tiotropium  1 capsule Inhalation Daily   Continuous Infusions: . sodium chloride 50 mL/hr at 01/19/2018 1337  . clindamycin     PRN Meds: acetaminophen **OR** acetaminophen, ALPRAZolam, prochlorperazine  Allergies:    Allergies  Allergen Reactions  . Penicillins Rash and Other (See Comments)    Has patient had a PCN reaction causing immediate rash, facial/tongue/throat swelling, SOB or lightheadedness with hypotension: Unknown Has patient had a PCN reaction causing severe rash involving mucus membranes or skin necrosis: Unknown Has patient had a PCN reaction that required hospitalization: No Has patient had a  PCN reaction occurring within the last 10 years: No If all of the above answers are "NO", then may proceed with Cephalosporin use.   . Venlafaxine Swelling  . Sulfa Antibiotics Hives    Social History:   Social History   Socioeconomic History  . Marital status: Divorced    Spouse name: Not on file  . Number of children: Not on file  . Years of education: Not on file  . Highest education level: Not on file    Occupational History  . Not on file  Social Needs  . Financial resource strain: Not on file  . Food insecurity:    Worry: Not on file    Inability: Not on file  . Transportation needs:    Medical: Not on file    Non-medical: Not on file  Tobacco Use  . Smoking status: Current Every Day Smoker    Packs/day: 0.30    Types: Cigarettes  . Smokeless tobacco: Never Used  Substance and Sexual Activity  . Alcohol use: No    Frequency: Never  . Drug use: No  . Sexual activity: Not on file  Lifestyle  . Physical activity:    Days per week: Not on file    Minutes per session: Not on file  . Stress: Not on file  Relationships  . Social connections:    Talks on phone: Not on file    Gets together: Not on file    Attends religious service: Not on file    Active member of club or organization: Not on file    Attends meetings of clubs or organizations: Not on file    Relationship status: Not on file  . Intimate partner violence:    Fear of current or ex partner: Not on file    Emotionally abused: Not on file    Physically abused: Not on file    Forced sexual activity: Not on file  Other Topics Concern  . Not on file  Social History Narrative  . Not on file    Family History:    Family History  Problem Relation Age of Onset  . CAD Father   . Aneurysm Father   . Diabetes Brother   . Cancer Sister   . Kidney cancer Neg Hx   . Bladder Cancer Neg Hx      ROS:  Please see the history of present illness.  Positive for weight loss, malaise, shortness of breath, leg swelling, abdominal pain, constipation, and weakness. All other ROS reviewed and negative.     Physical Exam/Data:   Vitals:   January 26, 2018 1445 January 26, 2018 1449 01/26/18 1455 Jan 26, 2018 1505  BP: 126/80 117/76 132/83 127/78  Pulse: 92  94 89  Resp: 16 16 16 18   Temp:      TempSrc:      SpO2: 97% 98% 98% 97%  Weight:      Height:        Intake/Output Summary (Last 24 hours) at 01-26-2018 1551 Last data filed at  26-Jan-2018 1523 Gross per 24 hour  Intake 3421.23 ml  Output 210 ml  Net 3211.23 ml   Filed Weights   12/31/17 0100  Weight: 52.9 kg   Body mass index is 21.33 kg/m.  General: Frail-appearing woman in no acute distress, chronically ill-appearing HEENT: normal Lymph: no adenopathy Neck: no JVD Endocrine:  No thryomegaly Vascular: No carotid bruits;  Cardiac:  normal S1, S2; RRR; no murmur  Lungs:  clear to auscultation bilaterally, no wheezing, rhonchi or  rales  Abd: Markedly distended, diffusely tender Ext: 1+ pretibial bilateral edema Musculoskeletal:  No deformities, BUE and BLE strength normal and equal Skin: warm and dry  Neuro:  CNs 2-12 intact, no focal abnormalities noted Psych: Flat affect   Relevant CV Studies: None  Laboratory Data:  Chemistry Recent Labs  Lab 01/14/2018 2036 12/31/17 0451 12/31/17 1302 01/10/2018 0605  NA 137 138  --  140  K <2.0* <2.0* 2.1* 4.8  CL 87* 88*  --  101  CO2 35* 36*  --  25  GLUCOSE 98 83  --  72  BUN 19 17  --  18  CREATININE 0.84 0.80  --  0.65  CALCIUM 7.7* 7.4*  --  7.5*  GFRNONAA >60 >60  --  >60  GFRAA >60 >60  --  >60  ANIONGAP 15 14  --  14    Recent Labs  Lab 01/28/2018 2036  PROT 5.2*  ALBUMIN 2.8*  AST 40  ALT 28  ALKPHOS 116  BILITOT 1.6*   Hematology Recent Labs  Lab 01/22/2018 2000 12/31/17 0451 01/07/2018 0605  WBC 12.9* 10.5 10.9*  RBC 4.63 4.58 4.48  HGB 15.1* 14.9 14.7  HCT 43.1 43.0 43.6  MCV 93.1 93.9 97.3  MCH 32.6 32.5 32.8  MCHC 35.0 34.7 33.7  RDW 13.5 13.4 14.6  PLT 225 196 216   Cardiac Enzymes Recent Labs  Lab 01/11/2018 2036  TROPONINI 0.04*   No results for input(s): TROPIPOC in the last 168 hours.  BNP Recent Labs  Lab 01/10/2018 2000  BNP 94.0    DDimer No results for input(s): DDIMER in the last 168 hours.  Radiology/Studies:  Dg Chest 2 View  Result Date: 01/19/2018 CLINICAL DATA:  Bilateral lower extremity edema and abdominal distension. EXAM: CHEST - 2 VIEW  COMPARISON:  Chest CT 06/08/2017 FINDINGS: Cardiomediastinal silhouette is normal. Mediastinal contours appear intact. Calcific atherosclerotic disease of the aorta. There is no evidence of focal airspace consolidation, pleural effusion or pneumothorax. Hyperinflation of the lungs. Mild bronchiectasis and peribronchial thickening, similar to the prior study. Osseous structures are without acute abnormality. Soft tissues are grossly normal. IMPRESSION: Mild bronchiectasis and peribronchial thickening, similar to the prior study. Hyperinflation of the lungs. No evidence of acute lobar airspace consolidation or pulmonary edema. Electronically Signed   By: Ted Mcalpine M.D.   On: 01/27/2018 20:37   Dg Abd 1 View  Result Date: 01/28/2018 CLINICAL DATA:  Bowel obstruction.  Abdominal pain and discomfort. EXAM: ABDOMEN - 1 VIEW COMPARISON:  CT of the abdomen and pelvis 01/19/2018 FINDINGS: Marked dilation of the colon is again noted. There is no significant interval change. A J-tube is coiled in the stomach. No free air is present. IMPRESSION: Stable distal colonic obstruction. Electronically Signed   By: Marin Roberts M.D.   On: 12/31/2017 10:55   Ct Angio Chest Pe W Or Wo Contrast  Result Date: 12/31/2017 CLINICAL DATA:  History of deep venous thrombosis and difficulty breathing. EXAM: CT ANGIOGRAPHY CHEST WITH CONTRAST TECHNIQUE: Multidetector CT imaging of the chest was performed using the standard protocol during bolus administration of intravenous contrast. Multiplanar CT image reconstructions and MIPs were obtained to evaluate the vascular anatomy. CONTRAST:  75mL ISOVUE-370 IOPAMIDOL (ISOVUE-370) INJECTION 76% COMPARISON:  None. FINDINGS: Cardiovascular: Thoracic aorta is within normal limits without aneurysmal dilatation or dissection. No cardiac enlargement is seen. Mild coronary calcifications are noted. Pulmonary artery is well visualized within normal branching pattern. Few small filling  defects are noted  within the left upper lobe pulmonary arterial branches consistent with pulmonary emboli. No right heart strain is seen. No significant central emboli are seen. Mediastinum/Nodes: Nasogastric catheter is identified extending into the stomach are present. The esophagus is within normal limits. No hilar or mediastinal adenopathy is noted. The thoracic inlet is unremarkable. Lungs/Pleura: Lungs are aerated bilaterally without evidence of focal infiltrate or sizable effusion. Mild mucous plugging is noted in the right lower lobe without significant consolidation or collapse. No sizable parenchymal nodules are noted. Upper Abdomen: Visualized upper abdomen demonstrates significant fecal material throughout the colon. This is similar to that seen on prior CT examination. Musculoskeletal: Degenerative changes of the thoracic spine are noted. Review of the MIP images confirms the above findings. IMPRESSION: Small pulmonary emboli within the left upper lobe pulmonary arterial branches without right heart strain. Persistent changes of constipation similar to that seen on recent CT of the abdomen. Mild mucous plugging without significant consolidation. These results will be called to the ordering clinician or representative by the Radiologist Assistant, and communication documented in the PACS or zVision Dashboard. Electronically Signed   By: Alcide Clever M.D.   On: 12/31/2017 15:37   Ct Abdomen Pelvis W Contrast  Result Date: 01-24-2018 CLINICAL DATA:  67 year old female with abdominal pain. History of sigmoid diverticulitis. EXAM: CT ABDOMEN AND PELVIS WITH CONTRAST TECHNIQUE: Multidetector CT imaging of the abdomen and pelvis was performed using the standard protocol following bolus administration of intravenous contrast. CONTRAST:  75mL OMNIPAQUE IOHEXOL 300 MG/ML  SOLN COMPARISON:  Abdominal radiograph dated 01-24-18 and CT dated 07/07/2016 FINDINGS: Lower chest: Right lung base scarring primarily  involving the right middle lobe noted. The left lung base is clear. No intra-abdominal free air or free fluid. Hepatobiliary: No focal liver abnormality is seen. No gallstones, gallbladder wall thickening, or biliary dilatation. Pancreas: Unremarkable. No pancreatic ductal dilatation or surrounding inflammatory changes. Spleen: Normal in size without focal abnormality. Adrenals/Urinary Tract: Adrenal glands are unremarkable. Kidneys are normal, without renal calculi, focal lesion, or hydronephrosis. Bladder is unremarkable. Stomach/Bowel: There is diffuse severe distention of the colon with gas and semi-solid fecal matter. The transverse colon measure up to 9 cm in diameter. The distal sigmoid and rectum are collapsed. There is a transition zone in the sigmoid colon (series 2, image 64 and coronal series 5, image 45) in the area of prior diverticulitis. Findings likely represent an obstruction secondary to stricture or adhesion, related to prior inflammatory process, although a mass is not excluded. A pseudo-obstruction or adynamic ileus are other consideration. There is no evidence of colonic twisting. There is sigmoid diverticulosis without active inflammatory changes. There is mild air distention of the distal small bowel and terminal ileum. Mildly thickened appearance of the distal small bowel loops in the pelvis may be reactive or represent enteritis. No evidence of small-bowel obstruction. There is no pneumatosis at this time. The appendix is unremarkable. Vascular/Lymphatic: Advanced aortoiliac atherosclerotic disease. Apparent low attenuating content within the proximal right femoral vein (series 2, image 87) likely mixing artifact. O'clock is less likely. Duplex ultrasound may provide better evaluation if there is clinical concern for DVT. The SMV, splenic vein, and main portal vein are patent. No portal venous gas. There is no adenopathy. Reproductive: Hysterectomy. No pelvic mass. Other: There is midline  vertical anterior pelvic wall incisional scar. Subcutaneous edema of the left thigh. Musculoskeletal: Osteopenia with degenerative changes of the spine. No acute osseous pathology. IMPRESSION: 1. Severe distention of the colon with transition at the  sigmoid, possibly secondary to stricture and obstruction versus less likely a pseudo-obstruction or an ileus. Clinical correlation and surgical consult is advised. No pneumatosis or portal venous gas. 2. Mildly thickened appearance of the distal small bowel loops in the pelvis may be reactive or represent enteritis. No evidence of small-bowel obstruction. Normal appendix. 3. Artifact versus less likely a right femoral vein DVT. 4. Advanced aortoiliac atherosclerotic disease. Electronically Signed   By: Elgie Collard M.D.   On: 2018-01-10 23:06   US Venous Img Lower Bilateral  Result Date: 12/31/2017 CLINICAL DATA:  Bilateral lower extremity swelling for 1 week EXAM: BILATERAL LOWER EXTREMITY VENOUS DUPLEX ULTRASOUND TECHNIQUE: Doppler venous assessment of the bilateral lower extremity deep venous system was performed, including characterization of spectral flow, compressibility, and phasicity. COMPARISON:  None. FINDINGS: Right lower extremity: There is nonocclusive thrombus in the proximal right femoral vein. It is partially occlusive. The common femoral vein, mid right femoral vein, distal right femoral vein, and right popliteal vein are compressible. Doppler analysis demonstrates respiratory phasicity. Augmentation was deferred. Left lower extremity: There is complete compressibility of the left common femoral and femoral veins. The left popliteal vein is partially occlusive and contains partially occlusive thrombus. Doppler analysis demonstrates respiratory phasicity. Augmentation was deferred. IMPRESSION: The study is positive for bilateral DVT. There is partially occlusive thrombus in the proximal right femoral vein and partially occlusive thrombus in the  left popliteal vein. Electronically Signed   By: Jolaine Click M.D.   On: 12/31/2017 13:30   Dg Abd 2 Views  Result Date: 01/10/18 CLINICAL DATA:  Distended abdomen EXAM: ABDOMEN - 2 VIEW COMPARISON:  CT 07/07/2016 FINDINGS: Markedly distended colon diffusely. Air-fluid levels right colon. Small bowel nondilated. No free air. IMPRESSION: Marked colonic distention compatible with ileus. Electronically Signed   By: Marlan Palau M.D.   On: 01-10-2018 20:37    Assessment and Plan:   This is a 67 year old woman in poor health who presents with severe bowel obstruction as well as DVT/PE.  The patient has undergone urgent IVC filter placement.  Notes of hematology are reviewed and she will require oral anticoagulation following emergent bowel surgery.  With DVT/PE and bowel obstruction, she is certainly at risk for underlying malignancy.  I do not think she needs any specific cardiac evaluation prior to surgery as she really has not had any cardiac symptoms and her surgery is urgent/emergent in nature.  I would recommend proceeding with no further testing at this time.  Would be happy to follow her if she develops any cardiac-related problems.  Will order an EKG so that we have a current one on file.  No other specific recommendations at this time.      For questions or updates, please contact [facility] Please consult [email or web address] for contact info under     Signed, Tonny Bollman, MD  01/20/2018 3:51 PM

## 2018-01-01 NOTE — Transfer of Care (Signed)
Immediate Anesthesia Transfer of Care Note  Patient: Yvette Martinez  Procedure(s) Performed: EXPLORATORY LAPAROTOMY (N/A Abdomen) COLOSTOMY COLON RESECTION SIGMOID  Patient Location: PACU and ICU  Anesthesia Type:General  Level of Consciousness: sedated  Airway & Oxygen Therapy: Patient placed on Ventilator (see vital sign flow sheet for setting)  Post-op Assessment: Report given to RN  Post vital signs: Reviewed and stable  Last Vitals:  Vitals Value Taken Time  BP 115/77 01/08/2018 10:45 PM  Temp 36.4 C 01/10/2018 10:45 PM  Pulse 95 01/20/2018 10:45 PM  Resp 12 01/10/2018 10:45 PM  SpO2 100 % 01/08/2018 10:45 PM  Vitals shown include unvalidated device data.  Last Pain:  Vitals:   01/23/2018 1554  TempSrc: Oral  PainSc:          Complications: No apparent anesthesia complications
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
## 2018-01-01 NOTE — Consult Note (Addendum)
Reason for Consult: Bowel obstruction with bilateral lower extremity DVT, pulmonary emboli, relative contraindication anticoagulation with surgery forthcoming, filter desired Referring Physician: Dr. Foye Clock is an 67 y.o. female.  HPI: This is an unfortunate 67 year old female who was admitted on 1 November for bilateral lower extremity edema and market abdominal swelling.  She had not had a bowel movement for 2 weeks.  She has a history of weight loss that is fairly pronounced.  She has a prior hysterectomy.  She has not had a previous colonoscopy.  She has a previous episode of DVT.  At this point in time her lack of clinical progress warrant surgical exploration.  The concern is that coming off heparin alone would lead her to unprotected state from her DVT with a history on CT scan of pulmonary embolus and previous DVT.  Filter under these circumstances as desired.  I am asked to see her in this context.  DVT study done with ultrasound demonstrates partial compressibility on the right common femoral position, complete compressibility on the left.  Past Medical History:  Diagnosis Date  . COPD (chronic obstructive pulmonary disease) ([place])   . Diabetes mellitus without complication ([place])   . Diverticula of colon   . DVT (deep vein thrombosis) in pregnancy   . Pneumonia     Past Surgical History:  Procedure Laterality Date  . ABDOMINAL HYSTERECTOMY    . CYSTECTOMY     patient states beind her bladder  . TONSILLECTOMY      Family History  Problem Relation Age of Onset  . CAD Father   . Aneurysm Father   . Diabetes Brother   . Cancer Sister   . Kidney cancer Neg Hx   . Bladder Cancer Neg Hx     Social History:  reports that she has been smoking cigarettes. She has been smoking about 0.30 packs per day. She has never used smokeless tobacco. She reports that she does not drink alcohol or use drugs.  Allergies:  Allergies  Allergen Reactions  . Penicillins Rash  and Other (See Comments)    Has patient had a PCN reaction causing immediate rash, facial/tongue/throat swelling, SOB or lightheadedness with hypotension: Unknown Has patient had a PCN reaction causing severe rash involving mucus membranes or skin necrosis: Unknown Has patient had a PCN reaction that required hospitalization: No Has patient had a PCN reaction occurring within the last 10 years: No If all of the above answers are "NO", then may proceed with Cephalosporin use.   . Venlafaxine Swelling  . Sulfa Antibiotics Hives    Medications: I have reviewed the patient's current medications.  Results for orders placed or performed during the hospital encounter of 01/16/2018 (from the past 48 hour(s))  CBC with Differential/Platelet     Status: Abnormal   Collection Time: 01/26/2018  8:00 PM  Result Value Ref Range   WBC 12.9 (H) 4.0 - 10.5 K/uL   RBC 4.63 3.87 - 5.11 MIL/uL   Hemoglobin 15.1 (H) 12.0 - 15.0 g/dL   HCT 43.1 36.0 - 46.0 %   MCV 93.1 80.0 - 100.0 fL   MCH 32.6 26.0 - 34.0 pg   MCHC 35.0 30.0 - 36.0 g/dL   RDW 13.5 11.5 - 15.5 %   Platelets 225 150 - 400 K/uL   nRBC 0.0 0.0 - 0.2 %   Neutrophils Relative % 84 %   Neutro Abs 10.7 (H) 1.7 - 7.7 K/uL   Lymphocytes Relative 9 %  Lymphs Abs 1.2 0.7 - 4.0 K/uL   Monocytes Relative 5 %   Monocytes Absolute 0.7 0.1 - 1.0 K/uL   Eosinophils Relative 1 %   Eosinophils Absolute 0.1 0.0 - 0.5 K/uL   Basophils Relative 0 %   Basophils Absolute 0.0 0.0 - 0.1 K/uL   Immature Granulocytes 1 %   Abs Immature Granulocytes 0.11 (H) 0.00 - 0.07 K/uL    Comment: Performed at [facility], [address] [place], [place] [address]  Brain natriuretic peptide     Status: None   Collection Time: 01/08/2018  8:00 PM  Result Value Ref Range   B Natriuretic Peptide 94.0 0.0 - 100.0 pg/mL    Comment: Performed at [facility], [address] [place], [place] [address]  Comprehensive metabolic panel     Status: Abnormal    Collection Time: 01/19/2018  8:36 PM  Result Value Ref Range   Sodium 137 135 - 145 mmol/L   Potassium <2.0 (LL) 3.5 - 5.1 mmol/L    Comment: CRITICAL RESULT CALLED TO, READ BACK BY AND VERIFIED WITH DEVAN WILKINS ON 01/28/2018 AT 2147 JAG    Chloride 87 (L) 98 - 111 mmol/L   CO2 35 (H) 22 - 32 mmol/L   Glucose, Bld 98 70 - 99 mg/dL   BUN 19 8 - 23 mg/dL   Creatinine, Ser 0.84 0.44 - 1.00 mg/dL   Calcium 7.7 (L) 8.9 - 10.3 mg/dL   Total Protein 5.2 (L) 6.5 - 8.1 g/dL   Albumin 2.8 (L) 3.5 - 5.0 g/dL   AST 40 15 - 41 U/L   ALT 28 0 - 44 U/L   Alkaline Phosphatase 116 38 - 126 U/L   Total Bilirubin 1.6 (H) 0.3 - 1.2 mg/dL   GFR calc non Af Amer >60 >60 mL/min   GFR calc Af Amer >60 >60 mL/min    Comment: (NOTE) The eGFR has been calculated using the CKD EPI equation. This calculation has not been validated in all clinical situations. eGFR's persistently <60 mL/min signify possible Chronic Kidney Disease.    Anion gap 15 5 - 15    Comment: Performed at [facility], [address] [place], [place] [address]  Magnesium     Status: Abnormal   Collection Time: 01/04/2018  8:36 PM  Result Value Ref Range   Magnesium 1.5 (L) 1.7 - 2.4 mg/dL    Comment: Performed at [facility], [address] [place], [place] [address]  Troponin I     Status: Abnormal   Collection Time: 01/26/2018  8:36 PM  Result Value Ref Range   Troponin I 0.04 (HH) <0.03 ng/mL    Comment: CRITICAL RESULT CALLED TO, READ BACK BY AND VERIFIED WITH DEVAN WILKINS ON 01/19/2018 AT 2147 JAG Performed at [facility], [address] [place], [place] [address]   Basic metabolic panel     Status: Abnormal   Collection Time: 12/31/17  4:51 AM  Result Value Ref Range   Sodium 138 135 - 145 mmol/L   Potassium <2.0 (LL) 3.5 - 5.1 mmol/L    Comment: CRITICAL RESULT CALLED TO, READ BACK BY AND VERIFIED WITH STEVEN SIKES ON 12/31/17 AT 0531 JAG    Chloride 88 (L) 98 - 111 mmol/L   CO2 36 (H)  22 - 32 mmol/L   Glucose, Bld 83 70 - 99 mg/dL   BUN 17 8 - 23 mg/dL   Creatinine, Ser 0.80 0.44 - 1.00 mg/dL  Calcium 7.4 (L) 8.9 - 10.3 mg/dL   GFR calc non Af Amer >60 >60 mL/min   GFR calc Af Amer >60 >60 mL/min    Comment: (NOTE) The eGFR has been calculated using the CKD EPI equation. This calculation has not been validated in all clinical situations. eGFR's persistently <60 mL/min signify possible Chronic Kidney Disease.    Anion gap 14 5 - 15    Comment: Performed at [facility], [address] [place], [place] [address]  CBC     Status: None   Collection Time: 12/31/17  4:51 AM  Result Value Ref Range   WBC 10.5 4.0 - 10.5 K/uL   RBC 4.58 3.87 - 5.11 MIL/uL   Hemoglobin 14.9 12.0 - 15.0 g/dL   HCT 43.0 36.0 - 46.0 %   MCV 93.9 80.0 - 100.0 fL   MCH 32.5 26.0 - 34.0 pg   MCHC 34.7 30.0 - 36.0 g/dL   RDW 13.4 11.5 - 15.5 %   Platelets 196 150 - 400 K/uL   nRBC 0.0 0.0 - 0.2 %    Comment: Performed at [facility], [address], [place], [place] [address]  Magnesium     Status: Abnormal   Collection Time: 12/31/17  1:02 PM  Result Value Ref Range   Magnesium 2.7 (H) 1.7 - 2.4 mg/dL    Comment: Performed at [facility], [address], [place], [place] [address]  Potassium     Status: Abnormal   Collection Time: 12/31/17  1:02 PM  Result Value Ref Range   Potassium 2.1 (LL) 3.5 - 5.1 mmol/L    Comment: CRITICAL RESULT CALLED TO, READ BACK BY AND VERIFIED WITH TERRY TURNER AT 1400 ON 12/31/17 BY SNJ Performed at [facility], [address] [place], [place] [address]   APTT     Status: None   Collection Time: 12/31/17  2:31 PM  Result Value Ref Range   aPTT 26 24 - 36 seconds    Comment: Performed at [facility], [address], [place], [place] [address]  Protime-INR     Status: None   Collection Time: 12/31/17  2:31 PM  Result Value Ref Range   Prothrombin Time 14.1 11.4 - 15.2 seconds   INR 1.10      Comment: Performed at [facility], [place], [place] [address]  Heparin level (unfractionated)     Status: Abnormal   Collection Time: 12/31/17  8:49 PM  Result Value Ref Range   Heparin Unfractionated 1.48 (H) 0.30 - 0.70 IU/mL    Comment: (NOTE) If heparin results are below expected values, and patient dosage has  been confirmed, suggest follow up testing of antithrombin III levels. Performed at [facility], [address] [place], [place] [address]   Basic metabolic panel     Status: Abnormal   Collection Time: 01/12/2018  6:05 AM  Result Value Ref Range   Sodium 140 135 - 145 mmol/L   Potassium 4.8 3.5 - 5.1 mmol/L   Chloride 101 98 - 111 mmol/L   CO2 25 22 - 32 mmol/L   Glucose, Bld 72 70 - 99 mg/dL   BUN 18 8 - 23 mg/dL   Creatinine, Ser 0.65 0.44 - 1.00 mg/dL   Calcium 7.5 (L) 8.9 - 10.3 mg/dL   GFR calc non Af Amer >60 >60 mL/min   GFR calc Af Amer >60 >60 mL/min    Comment: (NOTE) The eGFR has  been calculated using the CKD EPI equation. This calculation has not been validated in all clinical situations. eGFR's persistently <60 mL/min signify possible Chronic Kidney Disease.    Anion gap 14 5 - 15    Comment: Performed at [facility], [address] [place], [place] [address]  CBC     Status: Abnormal   Collection Time: 01/09/2018  6:05 AM  Result Value Ref Range   WBC 10.9 (H) 4.0 - 10.5 K/uL   RBC 4.48 3.87 - 5.11 MIL/uL   Hemoglobin 14.7 12.0 - 15.0 g/dL   HCT 43.6 36.0 - 46.0 %   MCV 97.3 80.0 - 100.0 fL   MCH 32.8 26.0 - 34.0 pg   MCHC 33.7 30.0 - 36.0 g/dL   RDW 14.6 11.5 - 15.5 %   Platelets 216 150 - 400 K/uL   nRBC 0.0 0.0 - 0.2 %    Comment: Performed at [facility], [place], [place] [address]  Heparin level (unfractionated)     Status: Abnormal   Collection Time: 01/21/2018  6:05 AM  Result Value Ref Range   Heparin Unfractionated 1.54 (H) 0.30 - 0.70 IU/mL    Comment:  (NOTE) If heparin results are below expected values, and patient dosage has  been confirmed, suggest follow up testing of antithrombin III levels. Performed at [facility], [address] [place], [place] [address]     Dg Chest 2 View  Result Date: 01/19/2018 CLINICAL DATA:  Bilateral lower extremity edema and abdominal distension. EXAM: CHEST - 2 VIEW COMPARISON:  Chest CT 06/08/2017 FINDINGS: Cardiomediastinal silhouette is normal. Mediastinal contours appear intact. Calcific atherosclerotic disease of the aorta. There is no evidence of focal airspace consolidation, pleural effusion or pneumothorax. Hyperinflation of the lungs. Mild bronchiectasis and peribronchial thickening, similar to the prior study. Osseous structures are without acute abnormality. Soft tissues are grossly normal. IMPRESSION: Mild bronchiectasis and peribronchial thickening, similar to the prior study. Hyperinflation of the lungs. No evidence of acute lobar airspace consolidation or pulmonary edema. Electronically Signed   By: Fidela Salisbury M.D.   On: 01/05/2018 20:37   Dg Abd 1 View  Result Date: 01/08/2018 CLINICAL DATA:  Bowel obstruction.  Abdominal pain and discomfort. EXAM: ABDOMEN - 1 VIEW COMPARISON:  CT of the abdomen and pelvis 01/16/2018 FINDINGS: Marked dilation of the colon is again noted. There is no significant interval change. A J-tube is coiled in the stomach. No free air is present. IMPRESSION: Stable distal colonic obstruction. Electronically Signed   By: San Morelle M.D.   On: 01/10/2018 10:55   Ct Angio Chest Pe W Or Wo Contrast  Result Date: 12/31/2017 CLINICAL DATA:  History of deep venous thrombosis and difficulty breathing. EXAM: CT ANGIOGRAPHY CHEST WITH CONTRAST TECHNIQUE: Multidetector CT imaging of the chest was performed using the standard protocol during bolus administration of intravenous contrast. Multiplanar CT image reconstructions and MIPs were obtained to  evaluate the vascular anatomy. CONTRAST:  74m ISOVUE-370 IOPAMIDOL (ISOVUE-370) INJECTION 76% COMPARISON:  None. FINDINGS: Cardiovascular: Thoracic aorta is within normal limits without aneurysmal dilatation or dissection. No cardiac enlargement is seen. Mild coronary calcifications are noted. Pulmonary artery is well visualized within normal branching pattern. Few small filling defects are noted within the left upper lobe pulmonary arterial branches consistent with pulmonary emboli. No right heart strain is seen. No significant central emboli are seen. Mediastinum/Nodes: Nasogastric catheter is identified extending into the stomach are present. The esophagus is within normal limits. No  hilar or mediastinal adenopathy is noted. The thoracic inlet is unremarkable. Lungs/Pleura: Lungs are aerated bilaterally without evidence of focal infiltrate or sizable effusion. Mild mucous plugging is noted in the right lower lobe without significant consolidation or collapse. No sizable parenchymal nodules are noted. Upper Abdomen: Visualized upper abdomen demonstrates significant fecal material throughout the colon. This is similar to that seen on prior CT examination. Musculoskeletal: Degenerative changes of the thoracic spine are noted. Review of the MIP images confirms the above findings. IMPRESSION: Small pulmonary emboli within the left upper lobe pulmonary arterial branches without right heart strain. Persistent changes of constipation similar to that seen on recent CT of the abdomen. Mild mucous plugging without significant consolidation. These results will be called to the ordering clinician or representative by the Radiologist Assistant, and communication documented in the PACS or zVision Dashboard. Electronically Signed   By: Inez Catalina M.D.   On: 12/31/2017 15:37   Ct Abdomen Pelvis W Contrast  Result Date: 01/09/2018 CLINICAL DATA:  67 year old female with abdominal pain. History of sigmoid diverticulitis.  EXAM: CT ABDOMEN AND PELVIS WITH CONTRAST TECHNIQUE: Multidetector CT imaging of the abdomen and pelvis was performed using the standard protocol following bolus administration of intravenous contrast. CONTRAST:  59m OMNIPAQUE IOHEXOL 300 MG/ML  SOLN COMPARISON:  Abdominal radiograph dated 01/14/2018 and CT dated 07/07/2016 FINDINGS: Lower chest: Right lung base scarring primarily involving the right middle lobe noted. The left lung base is clear. No intra-abdominal free air or free fluid. Hepatobiliary: No focal liver abnormality is seen. No gallstones, gallbladder wall thickening, or biliary dilatation. Pancreas: Unremarkable. No pancreatic ductal dilatation or surrounding inflammatory changes. Spleen: Normal in size without focal abnormality. Adrenals/Urinary Tract: Adrenal glands are unremarkable. Kidneys are normal, without renal calculi, focal lesion, or hydronephrosis. Bladder is unremarkable. Stomach/Bowel: There is diffuse severe distention of the colon with gas and semi-solid fecal matter. The transverse colon measure up to 9 cm in diameter. The distal sigmoid and rectum are collapsed. There is a transition zone in the sigmoid colon (series 2, image 64 and coronal series 5, image 45) in the area of prior diverticulitis. Findings likely represent an obstruction secondary to stricture or adhesion, related to prior inflammatory process, although a mass is not excluded. A pseudo-obstruction or adynamic ileus are other consideration. There is no evidence of colonic twisting. There is sigmoid diverticulosis without active inflammatory changes. There is mild air distention of the distal small bowel and terminal ileum. Mildly thickened appearance of the distal small bowel loops in the pelvis may be reactive or represent enteritis. No evidence of small-bowel obstruction. There is no pneumatosis at this time. The appendix is unremarkable. Vascular/Lymphatic: Advanced aortoiliac atherosclerotic disease. Apparent low  attenuating content within the proximal right femoral vein (series 2, image 87) likely mixing artifact. O'clock is less likely. Duplex ultrasound may provide better evaluation if there is clinical concern for DVT. The SMV, splenic vein, and main portal vein are patent. No portal venous gas. There is no adenopathy. Reproductive: Hysterectomy. No pelvic mass. Other: There is midline vertical anterior pelvic wall incisional scar. Subcutaneous edema of the left thigh. Musculoskeletal: Osteopenia with degenerative changes of the spine. No acute osseous pathology. IMPRESSION: 1. Severe distention of the colon with transition at the sigmoid, possibly secondary to stricture and obstruction versus less likely a pseudo-obstruction or an ileus. Clinical correlation and surgical consult is advised. No pneumatosis or portal venous gas. 2. Mildly thickened appearance of the distal small bowel loops in the pelvis may  be reactive or represent enteritis. No evidence of small-bowel obstruction. Normal appendix. 3. Artifact versus less likely a right femoral vein DVT. 4. Advanced aortoiliac atherosclerotic disease. Electronically Signed   By: Anner Crete M.D.   On: 01/26/2018 23:06   US Venous Img Lower Bilateral  Result Date: 12/31/2017 CLINICAL DATA:  Bilateral lower extremity swelling for 1 week EXAM: BILATERAL LOWER EXTREMITY VENOUS DUPLEX ULTRASOUND TECHNIQUE: Doppler venous assessment of the bilateral lower extremity deep venous system was performed, including characterization of spectral flow, compressibility, and phasicity. COMPARISON:  None. FINDINGS: Right lower extremity: There is nonocclusive thrombus in the proximal right femoral vein. It is partially occlusive. The common femoral vein, mid right femoral vein, distal right femoral vein, and right popliteal vein are compressible. Doppler analysis demonstrates respiratory phasicity. Augmentation was deferred. Left lower extremity: There is complete compressibility  of the left common femoral and femoral veins. The left popliteal vein is partially occlusive and contains partially occlusive thrombus. Doppler analysis demonstrates respiratory phasicity. Augmentation was deferred. IMPRESSION: The study is positive for bilateral DVT. There is partially occlusive thrombus in the proximal right femoral vein and partially occlusive thrombus in the left popliteal vein. Electronically Signed   By: Marybelle Killings M.D.   On: 12/31/2017 13:30   Dg Abd 2 Views  Result Date: 01/07/2018 CLINICAL DATA:  Distended abdomen EXAM: ABDOMEN - 2 VIEW COMPARISON:  CT 07/07/2016 FINDINGS: Markedly distended colon diffusely. Air-fluid levels right colon. Small bowel nondilated. No free air. IMPRESSION: Marked colonic distention compatible with ileus. Electronically Signed   By: Franchot Gallo M.D.   On: 01/27/2018 20:37    Review of Systems  Constitutional: Positive for malaise/fatigue and weight loss.  HENT: Negative.   Eyes: Negative.   Respiratory: Negative for shortness of breath.   Cardiovascular: Negative for chest pain and orthopnea.  Gastrointestinal: Positive for abdominal pain, constipation, nausea and vomiting.       Nasogastric tube in place, blood-tinged  Genitourinary: Negative.   Musculoskeletal: Negative.   Skin: Negative.   Neurological: Negative.   Endo/Heme/Allergies: Negative.   Psychiatric/Behavioral: Positive for depression.   Blood pressure 111/73, pulse 93, temperature 98.2 F (36.8 C), temperature source Oral, resp. rate 18, height 5' 2"  (1.575 m), weight 52.9 kg, SpO2 95 %. Physical Exam  Constitutional: She is oriented to person, place, and time. She appears distressed.  Somewhat gaunt in appearance, frail  HENT:  Head: Normocephalic.  Eyes: Pupils are equal, round, and reactive to light. Conjunctivae are normal. No scleral icterus.  Neck: Normal range of motion. No tracheal deviation present. No thyromegaly present.  Cardiovascular: Normal rate  and regular rhythm.  Respiratory: Effort normal. No stridor. She has no wheezes. She has no rales.  GI: She exhibits distension. There is tenderness. There is guarding. There is no rebound.  Marked distention, no frank peritonitis  Musculoskeletal: She exhibits no edema, tenderness or deformity.  Lymphadenopathy:    She has no cervical adenopathy.  Neurological: She is alert and oriented to person, place, and time. No cranial nerve deficit.  Skin: Skin is warm. No rash noted. No erythema. There is pallor.  Psychiatric: She has a normal mood and affect. Thought content normal.    Assessment/Plan: At this point time given the concurrent DVT, evidence of pulmonary embolus on CT scan, and new DVT process of the right lower extremity, we will plan for filter placement to allow protection from portal PE during her surgical procedure.  I discussed this at length with her today,  risks of pain, bleeding, infection, migration, other untoward event.  At this point in time the benefits of placing this outweigh the risk given her presentation and other medical comorbidities and circumstances including the need for surgery without heparin protection.  We will plan on proceeding expeditiously.  I discussed with the patient risk, benefits, and alternatives to inferior vena caval filter placement.  The risks include but are not limited to: bleeding, infection, possible injury of the inferior vena cava, injury of structures adjacent to the filter including bowel, migration, fracture, continued pulmonary embolization, and caval thrombosis.  The patient acknowledges the risks and agrees to proceed.   Bayle Calvo A Lonnetta Kniskern 01/03/2018, 1:55 PM

## 2018-01-01 NOTE — Progress Notes (Signed)
[employer] [facility]   PATIENT NAME: Yvette Martinez    MR#:  [id number]  DATE OF BIRTH:  Feb 25, 1951  SUBJECTIVE:  CHIEF COMPLAINT:   Chief Complaint  Patient presents with  . Leg Swelling  . abdominal distention  Loosing weight and constipated for last few weeks. Noted severely hypokalemia and swelling both legs.  REVIEW OF SYSTEMS:  CONSTITUTIONAL: No fever,have fatigue or weakness.  EYES: No blurred or double vision.  EARS, NOSE, AND THROAT: No tinnitus or ear pain.  RESPIRATORY: No cough, shortness of breath, wheezing or hemoptysis.  CARDIOVASCULAR: No chest pain, orthopnea, edema.  GASTROINTESTINAL: No nausea, vomiting, diarrhea or have some abdominal pain.  GENITOURINARY: No dysuria, hematuria.  ENDOCRINE: No polyuria, nocturia,  HEMATOLOGY: No anemia, easy bruising or bleeding SKIN: No rash or lesion. MUSCULOSKELETAL: No joint pain or arthritis.   NEUROLOGIC: No tingling, numbness, weakness.  PSYCHIATRY: No anxiety or depression.   ROS  DRUG ALLERGIES:   Allergies  Allergen Reactions  . Penicillins Rash and Other (See Comments)    Has patient had a PCN reaction causing immediate rash, facial/tongue/throat swelling, SOB or lightheadedness with hypotension: Unknown Has patient had a PCN reaction causing severe rash involving mucus membranes or skin necrosis: Unknown Has patient had a PCN reaction that required hospitalization: No Has patient had a PCN reaction occurring within the last 10 years: No If all of the above answers are "NO", then may proceed with Cephalosporin use.   . Venlafaxine Swelling  . Sulfa Antibiotics Hives    VITALS:  Blood pressure 119/76, pulse 93, temperature 97.7 F (36.5 C), temperature source Oral, resp. rate 17, height 5\' 2"  (1.575 m), weight 52.9 kg, SpO2 96 %.  PHYSICAL EXAMINATION:  GENERAL:  67 y.o.-year-old severe malnourished patient lying in the bed with no acute distress.  EYES: Pupils equal,  round, reactive to light and accommodation. No scleral icterus. Extraocular muscles intact.  HEENT: Head atraumatic, normocephalic. Oropharynx and nasopharynx clear.  NECK:  Supple, no jugular venous distention. No thyroid enlargement, no tenderness.  LUNGS: Normal breath sounds bilaterally, no wheezing, rales,rhonchi or crepitation. No use of accessory muscles of respiration.  CARDIOVASCULAR: S1, S2 normal. No murmurs, rubs, or gallops.  ABDOMEN: Soft, nontender, distended. Bowel sounds sluggish. No organomegaly or mass.  EXTREMITIES: b/l pedal edema,no cyanosis, or clubbing.  NEUROLOGIC: Cranial nerves II through XII are intact. Muscle strength 4/5 in all extremities. Sensation intact. Gait not checked.  PSYCHIATRIC: The patient is alert and oriented x 3.  SKIN: No obvious rash, lesion, or ulcer.   Physical Exam LABORATORY PANEL:   CBC Recent Labs  Lab 2018/01/31 0605  WBC 10.9*  HGB 14.7  HCT 43.6  PLT 216   ------------------------------------------------------------------------------------------------------------------  Chemistries  Recent Labs  Lab 01/22/2018 2036  12/31/17 1302 2018-01-31 0605  NA 137   < >  --  140  K <2.0*   < > 2.1* 4.8  CL 87*   < >  --  101  CO2 35*   < >  --  25  GLUCOSE 98   < >  --  72  BUN 19   < >  --  18  CREATININE 0.84   < >  --  0.65  CALCIUM 7.7*   < >  --  7.5*  MG 1.5*  --  2.7*  --   AST 40  --   --   --   ALT 28  --   --   --  ALKPHOS 116  --   --   --   BILITOT 1.6*  --   --   --    < > = values in this interval not displayed.   ------------------------------------------------------------------------------------------------------------------  Cardiac Enzymes Recent Labs  Lab 01-09-18 2036  TROPONINI 0.04*   ------------------------------------------------------------------------------------------------------------------  RADIOLOGY:  Dg Abd 1 View  Result Date: 01/21/2018 CLINICAL DATA:  Bowel obstruction.  Abdominal  pain and discomfort. EXAM: ABDOMEN - 1 VIEW COMPARISON:  CT of the abdomen and pelvis 09-Jan-2018 FINDINGS: Marked dilation of the colon is again noted. There is no significant interval change. A J-tube is coiled in the stomach. No free air is present. IMPRESSION: Stable distal colonic obstruction. Electronically Signed   By: Marin Roberts M.D.   On: 01/21/2018 10:55   Ct Angio Chest Pe W Or Wo Contrast  Result Date: 12/31/2017 CLINICAL DATA:  History of deep venous thrombosis and difficulty breathing. EXAM: CT ANGIOGRAPHY CHEST WITH CONTRAST TECHNIQUE: Multidetector CT imaging of the chest was performed using the standard protocol during bolus administration of intravenous contrast. Multiplanar CT image reconstructions and MIPs were obtained to evaluate the vascular anatomy. CONTRAST:  75mL ISOVUE-370 IOPAMIDOL (ISOVUE-370) INJECTION 76% COMPARISON:  None. FINDINGS: Cardiovascular: Thoracic aorta is within normal limits without aneurysmal dilatation or dissection. No cardiac enlargement is seen. Mild coronary calcifications are noted. Pulmonary artery is well visualized within normal branching pattern. Few small filling defects are noted within the left upper lobe pulmonary arterial branches consistent with pulmonary emboli. No right heart strain is seen. No significant central emboli are seen. Mediastinum/Nodes: Nasogastric catheter is identified extending into the stomach are present. The esophagus is within normal limits. No hilar or mediastinal adenopathy is noted. The thoracic inlet is unremarkable. Lungs/Pleura: Lungs are aerated bilaterally without evidence of focal infiltrate or sizable effusion. Mild mucous plugging is noted in the right lower lobe without significant consolidation or collapse. No sizable parenchymal nodules are noted. Upper Abdomen: Visualized upper abdomen demonstrates significant fecal material throughout the colon. This is similar to that seen on prior CT examination.  Musculoskeletal: Degenerative changes of the thoracic spine are noted. Review of the MIP images confirms the above findings. IMPRESSION: Small pulmonary emboli within the left upper lobe pulmonary arterial branches without right heart strain. Persistent changes of constipation similar to that seen on recent CT of the abdomen. Mild mucous plugging without significant consolidation. These results will be called to the ordering clinician or representative by the Radiologist Assistant, and communication documented in the PACS or zVision Dashboard. Electronically Signed   By: Alcide Clever M.D.   On: 12/31/2017 15:37   Ct Abdomen Pelvis W Contrast  Result Date: 01-09-18 CLINICAL DATA:  67 year old female with abdominal pain. History of sigmoid diverticulitis. EXAM: CT ABDOMEN AND PELVIS WITH CONTRAST TECHNIQUE: Multidetector CT imaging of the abdomen and pelvis was performed using the standard protocol following bolus administration of intravenous contrast. CONTRAST:  75mL OMNIPAQUE IOHEXOL 300 MG/ML  SOLN COMPARISON:  Abdominal radiograph dated 09-Jan-2018 and CT dated 07/07/2016 FINDINGS: Lower chest: Right lung base scarring primarily involving the right middle lobe noted. The left lung base is clear. No intra-abdominal free air or free fluid. Hepatobiliary: No focal liver abnormality is seen. No gallstones, gallbladder wall thickening, or biliary dilatation. Pancreas: Unremarkable. No pancreatic ductal dilatation or surrounding inflammatory changes. Spleen: Normal in size without focal abnormality. Adrenals/Urinary Tract: Adrenal glands are unremarkable. Kidneys are normal, without renal calculi, focal lesion, or hydronephrosis. Bladder is unremarkable. Stomach/Bowel: There is diffuse  severe distention of the colon with gas and semi-solid fecal matter. The transverse colon measure up to 9 cm in diameter. The distal sigmoid and rectum are collapsed. There is a transition zone in the sigmoid colon (series 2, image  64 and coronal series 5, image 45) in the area of prior diverticulitis. Findings likely represent an obstruction secondary to stricture or adhesion, related to prior inflammatory process, although a mass is not excluded. A pseudo-obstruction or adynamic ileus are other consideration. There is no evidence of colonic twisting. There is sigmoid diverticulosis without active inflammatory changes. There is mild air distention of the distal small bowel and terminal ileum. Mildly thickened appearance of the distal small bowel loops in the pelvis may be reactive or represent enteritis. No evidence of small-bowel obstruction. There is no pneumatosis at this time. The appendix is unremarkable. Vascular/Lymphatic: Advanced aortoiliac atherosclerotic disease. Apparent low attenuating content within the proximal right femoral vein (series 2, image 87) likely mixing artifact. O'clock is less likely. Duplex ultrasound may provide better evaluation if there is clinical concern for DVT. The SMV, splenic vein, and main portal vein are patent. No portal venous gas. There is no adenopathy. Reproductive: Hysterectomy. No pelvic mass. Other: There is midline vertical anterior pelvic wall incisional scar. Subcutaneous edema of the left thigh. Musculoskeletal: Osteopenia with degenerative changes of the spine. No acute osseous pathology. IMPRESSION: 1. Severe distention of the colon with transition at the sigmoid, possibly secondary to stricture and obstruction versus less likely a pseudo-obstruction or an ileus. Clinical correlation and surgical consult is advised. No pneumatosis or portal venous gas. 2. Mildly thickened appearance of the distal small bowel loops in the pelvis may be reactive or represent enteritis. No evidence of small-bowel obstruction. Normal appendix. 3. Artifact versus less likely a right femoral vein DVT. 4. Advanced aortoiliac atherosclerotic disease. Electronically Signed   By: Elgie Collard M.D.   On:  Jan 19, 2018 23:06   US Venous Img Lower Bilateral  Result Date: 12/31/2017 CLINICAL DATA:  Bilateral lower extremity swelling for 1 week EXAM: BILATERAL LOWER EXTREMITY VENOUS DUPLEX ULTRASOUND TECHNIQUE: Doppler venous assessment of the bilateral lower extremity deep venous system was performed, including characterization of spectral flow, compressibility, and phasicity. COMPARISON:  None. FINDINGS: Right lower extremity: There is nonocclusive thrombus in the proximal right femoral vein. It is partially occlusive. The common femoral vein, mid right femoral vein, distal right femoral vein, and right popliteal vein are compressible. Doppler analysis demonstrates respiratory phasicity. Augmentation was deferred. Left lower extremity: There is complete compressibility of the left common femoral and femoral veins. The left popliteal vein is partially occlusive and contains partially occlusive thrombus. Doppler analysis demonstrates respiratory phasicity. Augmentation was deferred. IMPRESSION: The study is positive for bilateral DVT. There is partially occlusive thrombus in the proximal right femoral vein and partially occlusive thrombus in the left popliteal vein. Electronically Signed   By: Jolaine Click M.D.   On: 12/31/2017 13:30    ASSESSMENT AND PLAN:   Principal Problem:   Bowel obstruction (HCC) Active Problems:   Hypokalemia   COPD (chronic obstructive pulmonary disease) (HCC)   Anxiety and depression   Hypomagnesemia   Abdominal distention   Acute deep vein thrombosis (DVT) of femoral vein of both lower extremities (HCC)   Pulmonary embolus (HCC)  * Bowel obstruction (HCC) -General surgery consulted   NG tube suction.   Also getting GI consult to have decompression colonoscopy.  *  Hypokalemia -significant hypokalemia, we will replace this and monitor  *  b/l DVT LL    Start on heparine drip.    Check for PE by CT angio.  *  COPD (chronic obstructive pulmonary disease) (HCC) -home  dose medications  *  Hypomagnesemia -replace and monitor  *  Anxiety and depression -home dose anxiolytic and antidepressant  * severe malnutrition  Once obstruction resolved, will need dietary consult.  All the records are reviewed and case discussed with Care Management/Social Workerr. Management plans discussed with the patient, family and they are in agreement.  CODE STATUS: Full.  TOTAL TIME TAKING CARE OF THIS PATIENT: 35 minutes.     POSSIBLE D/C IN 1-2 DAYS, DEPENDING ON CLINICAL CONDITION.   Altamese Dilling M.D on 01/09/2018   Between 7am to 6pm - Pager - [phone]  After 6pm go to [email or web address] - password [email or web address]  [facility]  Office  [phone]  CC: Primary care physician; Care, [facility]  Note: This dictation was prepared with Dragon dictation along with smaller phrase technology. Any transcriptional errors that result from this process are unintentional.

## 2018-01-01 NOTE — Progress Notes (Signed)
ANTICOAGULATION CONSULT NOTE - Initial Consult  Pharmacy Consult for heparin Indication: VTE treatment  Allergies  Allergen Reactions  . Penicillins Rash and Other (See Comments)    Has patient had a PCN reaction causing immediate rash, facial/tongue/throat swelling, SOB or lightheadedness with hypotension: Unknown Has patient had a PCN reaction causing severe rash involving mucus membranes or skin necrosis: Unknown Has patient had a PCN reaction that required hospitalization: No Has patient had a PCN reaction occurring within the last 10 years: No If all of the above answers are "NO", then may proceed with Cephalosporin use.   . Venlafaxine Swelling  . Sulfa Antibiotics Hives    Patient Measurements: Height: 5\' 2"  (157.5 cm) Weight: 116 lb 10 oz (52.9 kg) IBW/kg (Calculated) : 50.1 Heparin Dosing Weight: 52.9 kg  Vital Signs: Temp: 97.7 F (36.5 C) (11/03 1554) Temp Source: Oral (11/03 1554) BP: 119/76 (11/03 1554) Pulse Rate: 93 (11/03 1554)  Labs: Recent Labs    01/07/2018 2000 01/18/2018 2036 12/31/17 0451 12/31/17 1431 12/31/17 2049 01/22/2018 0605  HGB 15.1*  --  14.9  --   --  14.7  HCT 43.1  --  43.0  --   --  43.6  PLT 225  --  196  --   --  216  APTT  --   --   --  26  --   --   LABPROT  --   --   --  14.1  --   --   INR  --   --   --  1.10  --   --   HEPARINUNFRC  --   --   --   --  1.48* 1.54*  CREATININE  --  0.84 0.80  --   --  0.65  TROPONINI  --  0.04*  --   --   --   --     Estimated Creatinine Clearance: 52.5 mL/min (by C-G formula based on SCr of 0.65 mg/dL).   Medical History: Past Medical History:  Diagnosis Date  . COPD (chronic obstructive pulmonary disease) (HCC)   . Diabetes mellitus without complication (HCC)   . Diverticula of colon   . DVT (deep vein thrombosis) in pregnancy   . Pneumonia     Medications:  Infusions:  . sodium chloride 50 mL/hr at 01/11/2018 1337  . albumin human    . albumin human    . [MAR Hold] ciprofloxacin     . clindamycin    . [MAR Hold] metronidazole      Assessment: 67 yof cc leg swelling and abdominal distention with PMH COPD, DM, DVT in pregnancy, diverticula, pneumonia. Now US showing b/l DVT. Pharmacy consulted to dose heparin for DVT. NO PTA OAC noted.   Goal of Therapy:  Heparin level 0.3-0.7 units/ml Monitor platelets by anticoagulation protocol: Yes   Plan:  11/3 @ 0605 Heparin level still supratherapeutic @ 1.54. Spoke to nurse and we will hold heparin and resume infusion in 1 hour at a lower infusion rate of 550 units/hr.  Check anti-Xa level in 6 hours and daily while on heparin Continue to monitor H&H and platelets  11/3:  Heparin gtt was d/c'd prior to surgical procedure.  Pt will have filter placed to protect against DVT while off heparin.  Will need to f/u with MD/RN regarding plans to restart heparin.  Evalyne Cortopassi D, Pharm.D Clinical Pharmacist 01/03/2018,8:03 PM

## 2018-01-01 NOTE — Progress Notes (Signed)
Subjective:  CC:  Yvette Martinez is a 67 y.o. female  Hospital stay day 2, Day of Surgery sigmoid colon obstruction  HPI: No issue overnight.  ROS:  A 5 point review of systems was performed and pertinent positives and negatives noted in HPI.   Objective:      Temp:  [97.7 F (36.5 C)-98.2 F (36.8 C)] 97.7 F (36.5 C) (11/03 1554) Pulse Rate:  [87-97] 93 (11/03 1554) Resp:  [14-20] 17 (11/03 1554) BP: (111-132)/(73-83) 119/76 (11/03 1554) SpO2:  [94 %-98 %] 96 % (11/03 1554)     Height: 5\' 2"  (157.5 cm) Weight: 52.9 kg BMI (Calculated): 21.33   Intake/Output this shift:  Total I/O In: 825.4 [I.V.:725.4; IV Piggyback:100] Out: 160 [Emesis/NG output:160]       Constitutional :  alert, cooperative, appears stated age and no distress  Respiratory:  clear to auscultation bilaterally  Cardiovascular:  regular rate and rhythm  Gastrointestinal: soft, non-tender,  but with severe distention, worsening.  Skin: Cool and moist.   Psychiatric: Normal affect, non-agitated, not confused       LABS:  CMP Latest Ref Rng & Units Jan 06, 2018 12/31/2017 12/31/2017  Glucose 70 - 99 mg/dL 72 - 83  BUN 8 - 23 mg/dL 18 - 17  Creatinine 1.61 - 1.00 mg/dL 0.96 - 0.45  Sodium 409 - 145 mmol/L 140 - 138  Potassium 3.5 - 5.1 mmol/L 4.8 2.1(LL) <2.0(LL)  Chloride 98 - 111 mmol/L 101 - 88(L)  CO2 22 - 32 mmol/L 25 - 36(H)  Calcium 8.9 - 10.3 mg/dL 7.5(L) - 7.4(L)  Total Protein 6.5 - 8.1 g/dL - - -  Total Bilirubin 0.3 - 1.2 mg/dL - - -  Alkaline Phos 38 - 126 U/L - - -  AST 15 - 41 U/L - - -  ALT 0 - 44 U/L - - -   CBC Latest Ref Rng & Units 06-Jan-2018 12/31/2017 01/23/2018  WBC 4.0 - 10.5 K/uL 10.9(H) 10.5 12.9(H)  Hemoglobin 12.0 - 15.0 g/dL 81.1 91.4 15.1(H)  Hematocrit 36.0 - 46.0 % 43.6 43.0 43.1  Platelets 150 - 400 K/uL 216 196 225    RADS: n/a Assessment:   Sigmoid colon obstruction.  Mass vs stricture from previous diverticulitis.    Prep unsuccessful, GI signed off.   Clinical exam worsening distention but patient still not complaining much.  Due to worsening exam, I recommended to proceed with colectomy before perforation occurs.     Discussed ex-lap, colectomy, ileostomy.  The risk of surgery include, but not limited to, recurrence, bleeding, chronic pain, post-op infxn, post-op SBO or ileus, hernias, resection of bowel, re-anastamosis, and need for re-operation to address said risks. The risks of general anesthetic, if used, includes MI, CVA, sudden death or even reaction to anesthetic medications also discussed. Alternatives include continued observation and NG decompression.  Benefits include possible symptom relief, preventing further decline in health and likely death.  Typical post-op recovery time of additional days in hospital for observation afterwards also discussed.  The patient verbalized understanding and all questions were answered to the patient's satisfaction.  Vascular consulted and IVC filter placed, cards and oncology provided medical clearance, hep drip stopped for 4hrs prior to surgery.  Will proceed with procedure.

## 2018-01-01 NOTE — Anesthesia Preprocedure Evaluation (Signed)
Anesthesia Evaluation  Patient identified by MRN, date of birth, ID band Patient awake    Reviewed: Allergy & Precautions, NPO status , Patient's Chart, lab work & pertinent test results  Airway Mallampati: III       Dental   Pulmonary pneumonia, resolved, COPD,  COPD inhaler, Current Smoker, PE   Pulmonary exam normal        Cardiovascular + DVT  Normal cardiovascular exam     Neuro/Psych PSYCHIATRIC DISORDERS Anxiety Depression    GI/Hepatic Neg liver ROS, Diverticula of colon Mass in colon   Endo/Other  diabetes  Renal/GU   negative genitourinary   Musculoskeletal negative musculoskeletal ROS (+)   Abdominal Normal abdominal exam  (+)   Peds negative pediatric ROS (+)  Hematology negative hematology ROS (+)   Anesthesia Other Findings   Reproductive/Obstetrics                             Anesthesia Physical Anesthesia Plan  ASA: III and emergent  Anesthesia Plan: General   Post-op Pain Management:    Induction: Intravenous, Rapid sequence and Cricoid pressure planned  PONV Risk Score and Plan:   Airway Management Planned: Oral ETT  Additional Equipment:   Intra-op Plan:   Post-operative Plan: Extubation in OR and Possible Post-op intubation/ventilation  Informed Consent: I have reviewed the patients History and Physical, chart, labs and discussed the procedure including the risks, benefits and alternatives for the proposed anesthesia with the patient or authorized representative who has indicated his/her understanding and acceptance.   Dental advisory given  Plan Discussed with: CRNA and Surgeon  Anesthesia Plan Comments:         Anesthesia Quick Evaluation

## 2018-01-01 NOTE — Op Note (Signed)
[facility]   OPERATIVE NOTE    PRE-OPERATIVE DIAGNOSIS: Bilateral lower extremity DVT, pulmonary embolus, relative contraindication anticoagulation with need for exploratory laparotomy  POST-OPERATIVE DIAGNOSIS: same as above  PROCEDURE: 1.   Ultrasound guidance for vascular access to the right common femoral vein 2.   Catheter placement into the inferior vena cava 3.   Inferior venacavogram 4.   Placement of a Celect IVC filter  SURGEON: Learta Codding, MD  ASSISTANT(S): None  ANESTHESIA: local with Moderate Conscious Sedation for approximately 15 minutes using 1 mg of Versed and 25 mcg of Fentanyl  ESTIMATED BLOOD LOSS: minimal  CONTRAST: 18 cc  FLUORO TIME: less than one minute  FINDING(S): 1.  Patent IVC 2.  Successful placement of filter at the bilateral renal vein location   SPECIMEN(S):  none  INDICATIONS:   Yvette Martinez is a 67 y.o. female who presents with bilateral lower extremity DVT, pulmonary embolus, relative contraindication anticoagulation with need for exploratory laparotomy..  Inferior vena cava filter is indicated for this reason.  Risks and benefits including filter thrombosis, migration, fracture, bleeding, and infection were all discussed.  We discussed that all IVC filters that we place can be removed if desired from the patient once the need for the filter has passed.    DESCRIPTION: After obtaining full informed written consent, the patient was brought back to the vascular suite. The skin was sterilely prepped and draped in a sterile surgical field was created. Moderate conscious sedation was administered during a face to face encounter with the patient throughout the procedure with my supervision of the RN administering medicines and monitoring the patient's vital signs, pulse oximetry, telemetry and mental status throughout from the start of the procedure until the patient was taken to the recovery room. The right common  femoral vein was accessed under direct ultrasound guidance without difficulty with a Seldinger needle and a J-wire was then placed. After skin nick and dilatation, the delivery sheath was placed into the inferior vena cava and an inferior venacavogram was performed. This demonstrated a patent IVC with the level of the renal veins at L1-L2 interspace.  The filter was then deployed into the inferior vena cava at the level of L1-L2 interspace just below the renal veins. The delivery sheath was then removed. Pressure was held. Sterile dressings were placed. The patient tolerated the procedure well and was taken to the recovery room in stable condition.  COMPLICATIONS: None  CONDITION: Stable  Yvette Martinez [name]  01/11/2018, 2:55 PM   This note was created with Dragon Medical transcription system. Any errors in dictation are purely unintentional.

## 2018-01-01 NOTE — Progress Notes (Signed)
ANTICOAGULATION CONSULT NOTE - Initial Consult  Pharmacy Consult for heparin Indication: VTE treatment  Allergies  Allergen Reactions  . Penicillins Rash and Other (See Comments)    Has patient had a PCN reaction causing immediate rash, facial/tongue/throat swelling, SOB or lightheadedness with hypotension: Unknown Has patient had a PCN reaction causing severe rash involving mucus membranes or skin necrosis: Unknown Has patient had a PCN reaction that required hospitalization: No Has patient had a PCN reaction occurring within the last 10 years: No If all of the above answers are "NO", then may proceed with Cephalosporin use.   . Venlafaxine Swelling  . Sulfa Antibiotics Hives    Patient Measurements: Height: 5\' 2"  (157.5 cm) Weight: 116 lb 10 oz (52.9 kg) IBW/kg (Calculated) : 50.1 Heparin Dosing Weight: 52.9 kg  Vital Signs: Temp: 98.2 F (36.8 C) (11/03 0411) Temp Source: Oral (11/03 0411) BP: 122/82 (11/03 0411) Pulse Rate: 91 (11/03 0411)  Labs: Recent Labs    01/10/2018 2000 01/24/2018 2036 12/31/17 0451 12/31/17 1431 12/31/17 2049 01/19/2018 0605  HGB 15.1*  --  14.9  --   --  14.7  HCT 43.1  --  43.0  --   --  43.6  PLT 225  --  196  --   --  216  APTT  --   --   --  26  --   --   LABPROT  --   --   --  14.1  --   --   INR  --   --   --  1.10  --   --   HEPARINUNFRC  --   --   --   --  1.48* 1.54*  CREATININE  --  0.84 0.80  --   --  0.65  TROPONINI  --  0.04*  --   --   --   --     Estimated Creatinine Clearance: 52.5 mL/min (by C-G formula based on SCr of 0.65 mg/dL).   Medical History: Past Medical History:  Diagnosis Date  . COPD (chronic obstructive pulmonary disease) (HCC)   . Diabetes mellitus without complication (HCC)   . Diverticula of colon   . DVT (deep vein thrombosis) in pregnancy   . Pneumonia     Medications:  Infusions:  . sodium chloride 50 mL/hr at 12/31/2017 0845  . heparin      Assessment: 67 yof cc leg swelling and abdominal  distention with PMH COPD, DM, DVT in pregnancy, diverticula, pneumonia. Now US showing b/l DVT. Pharmacy consulted to dose heparin for DVT. NO PTA OAC noted.   Goal of Therapy:  Heparin level 0.3-0.7 units/ml Monitor platelets by anticoagulation protocol: Yes   Plan:  11/3 @ 0605 Heparin level still supratherapeutic @ 1.54. Spoke to nurse and we will hold heparin and resume infusion in 1 hour at a lower infusion rate of 550 units/hr.  Check anti-Xa level in 6 hours and daily while on heparin Continue to monitor H&H and platelets  Edrei Norgaard M Carmencita Cusic, Pharm.D., BCPS Clinical Pharmacist 01/13/2018,9:10 AM

## 2018-01-01 NOTE — Progress Notes (Signed)
Yvette Minium, MD [facility]   [address] [place], [place] [address] Phone: [phone] Fax : [phone]  Subjective: This patient has a distended abdomen with a colonic obstruction from either stricture or malignancy.  With the patient's continued weight loss over the last few months malignancy is a real concern.  The patient was found to have DVTs and has been started on heparin.    Objective: Vital signs in last 24 hours: Temp:  [98 F (36.7 C)-98.2 F (36.8 C)] 98.2 F (36.8 C) (11/03 0411) Pulse Rate:  [91-97] 91 (11/03 0411) Resp:  [16-20] 20 (11/03 0411) BP: (110-122)/(68-82) 122/82 (11/03 0411) SpO2:  [94 %-96 %] 94 % (11/03 0411) Last BM Date: 01/07/2018 No LMP recorded. Patient has had a hysterectomy. Body mass index is 21.33 kg/m. General:   Lethargic,  Well-developed, well-nourished, pleasant and cooperative in NAD Head:  Normocephalic and atraumatic. Eyes:  Sclera clear, no icterus.   Conjunctiva pink. Mouth:  No deformity or lesions, oropharynx pink & moist. Neck:  Supple; no masses or thyromegaly. Heart:  Regular rate and rhythm; no murmurs, clicks, rubs,  or gallops. Abdomen: Decreased bowel sounds with tympany and significant abdominal distention  Msk:  Symmetrical without gross deformities. Normal posture. Pulses:  Normal pulses noted. Extremities:  Without clubbing or edema. Neurologic:  grossly normal neurologically. Skin:  Intact without significant lesions or rashes. Cervical Nodes:  No significant cervical adenopathy. Psych: Lethargic and sleepy  Intake/Output from previous day: 11/02 0701 - 11/03 0700 In: 2595.8 [I.V.:1998.1; IV Piggyback:597.7] Out: 50 [Emesis/NG output:50]  Lab Results: Recent Labs    17-Jan-2018 2000 12/31/17 0451 01/25/2018 0605  WBC 12.9* 10.5 10.9*  HGB 15.1* 14.9 14.7  HCT 43.1 43.0 43.6  PLT 225 196 216   BMET Recent Labs    January 17, 2018 2036 12/31/17 0451 12/31/17 1302 01/04/2018 0605  NA 137 138  --  140  K  <2.0* <2.0* 2.1* 4.8  CL 87* 88*  --  101  CO2 35* 36*  --  25  GLUCOSE 98 83  --  72  BUN 19 17  --  18  CREATININE 0.84 0.80  --  0.65  CALCIUM 7.7* 7.4*  --  7.5*   LFT Recent Labs    01/17/18 2036  PROT 5.2*  ALBUMIN 2.8*  AST 40  ALT 28  ALKPHOS 116  BILITOT 1.6*   PT/INR Recent Labs    12/31/17 1431  LABPROT 14.1  INR 1.10    Studies/Results: Dg Chest 2 View  Result Date: 01-17-2018 CLINICAL DATA:  Bilateral lower extremity edema and abdominal distension. EXAM: CHEST - 2 VIEW COMPARISON:  Chest CT 06/08/2017 FINDINGS: Cardiomediastinal silhouette is normal. Mediastinal contours appear intact. Calcific atherosclerotic disease of the aorta. There is no evidence of focal airspace consolidation, pleural effusion or pneumothorax. Hyperinflation of the lungs. Mild bronchiectasis and peribronchial thickening, similar to the prior study. Osseous structures are without acute abnormality. Soft tissues are grossly normal. IMPRESSION: Mild bronchiectasis and peribronchial thickening, similar to the prior study. Hyperinflation of the lungs. No evidence of acute lobar airspace consolidation or pulmonary edema. Electronically Signed   By: Ted Mcalpine M.D.   On: 01/17/18 20:37   Ct Angio Chest Pe W Or Wo Contrast  Result Date: 12/31/2017 CLINICAL DATA:  History of deep venous thrombosis and difficulty breathing. EXAM: CT ANGIOGRAPHY CHEST WITH CONTRAST TECHNIQUE: Multidetector CT imaging of the chest was performed using the standard protocol during bolus administration of intravenous contrast. Multiplanar CT image  reconstructions and MIPs were obtained to evaluate the vascular anatomy. CONTRAST:  75mL ISOVUE-370 IOPAMIDOL (ISOVUE-370) INJECTION 76% COMPARISON:  None. FINDINGS: Cardiovascular: Thoracic aorta is within normal limits without aneurysmal dilatation or dissection. No cardiac enlargement is seen. Mild coronary calcifications are noted. Pulmonary artery is well visualized  within normal branching pattern. Few small filling defects are noted within the left upper lobe pulmonary arterial branches consistent with pulmonary emboli. No right heart strain is seen. No significant central emboli are seen. Mediastinum/Nodes: Nasogastric catheter is identified extending into the stomach are present. The esophagus is within normal limits. No hilar or mediastinal adenopathy is noted. The thoracic inlet is unremarkable. Lungs/Pleura: Lungs are aerated bilaterally without evidence of focal infiltrate or sizable effusion. Mild mucous plugging is noted in the right lower lobe without significant consolidation or collapse. No sizable parenchymal nodules are noted. Upper Abdomen: Visualized upper abdomen demonstrates significant fecal material throughout the colon. This is similar to that seen on prior CT examination. Musculoskeletal: Degenerative changes of the thoracic spine are noted. Review of the MIP images confirms the above findings. IMPRESSION: Small pulmonary emboli within the left upper lobe pulmonary arterial branches without right heart strain. Persistent changes of constipation similar to that seen on recent CT of the abdomen. Mild mucous plugging without significant consolidation. These results will be called to the ordering clinician or representative by the Radiologist Assistant, and communication documented in the PACS or zVision Dashboard. Electronically Signed   By: Alcide Clever M.D.   On: 12/31/2017 15:37   Ct Abdomen Pelvis W Contrast  Result Date: 01/27/2018 CLINICAL DATA:  67 year old female with abdominal pain. History of sigmoid diverticulitis. EXAM: CT ABDOMEN AND PELVIS WITH CONTRAST TECHNIQUE: Multidetector CT imaging of the abdomen and pelvis was performed using the standard protocol following bolus administration of intravenous contrast. CONTRAST:  75mL OMNIPAQUE IOHEXOL 300 MG/ML  SOLN COMPARISON:  Abdominal radiograph dated 01/26/2018 and CT dated 07/07/2016  FINDINGS: Lower chest: Right lung base scarring primarily involving the right middle lobe noted. The left lung base is clear. No intra-abdominal free air or free fluid. Hepatobiliary: No focal liver abnormality is seen. No gallstones, gallbladder wall thickening, or biliary dilatation. Pancreas: Unremarkable. No pancreatic ductal dilatation or surrounding inflammatory changes. Spleen: Normal in size without focal abnormality. Adrenals/Urinary Tract: Adrenal glands are unremarkable. Kidneys are normal, without renal calculi, focal lesion, or hydronephrosis. Bladder is unremarkable. Stomach/Bowel: There is diffuse severe distention of the colon with gas and semi-solid fecal matter. The transverse colon measure up to 9 cm in diameter. The distal sigmoid and rectum are collapsed. There is a transition zone in the sigmoid colon (series 2, image 64 and coronal series 5, image 45) in the area of prior diverticulitis. Findings likely represent an obstruction secondary to stricture or adhesion, related to prior inflammatory process, although a mass is not excluded. A pseudo-obstruction or adynamic ileus are other consideration. There is no evidence of colonic twisting. There is sigmoid diverticulosis without active inflammatory changes. There is mild air distention of the distal small bowel and terminal ileum. Mildly thickened appearance of the distal small bowel loops in the pelvis may be reactive or represent enteritis. No evidence of small-bowel obstruction. There is no pneumatosis at this time. The appendix is unremarkable. Vascular/Lymphatic: Advanced aortoiliac atherosclerotic disease. Apparent low attenuating content within the proximal right femoral vein (series 2, image 87) likely mixing artifact. O'clock is less likely. Duplex ultrasound may provide better evaluation if there is clinical concern for DVT. The SMV, splenic  vein, and main portal vein are patent. No portal venous gas. There is no adenopathy.  Reproductive: Hysterectomy. No pelvic mass. Other: There is midline vertical anterior pelvic wall incisional scar. Subcutaneous edema of the left thigh. Musculoskeletal: Osteopenia with degenerative changes of the spine. No acute osseous pathology. IMPRESSION: 1. Severe distention of the colon with transition at the sigmoid, possibly secondary to stricture and obstruction versus less likely a pseudo-obstruction or an ileus. Clinical correlation and surgical consult is advised. No pneumatosis or portal venous gas. 2. Mildly thickened appearance of the distal small bowel loops in the pelvis may be reactive or represent enteritis. No evidence of small-bowel obstruction. Normal appendix. 3. Artifact versus less likely a right femoral vein DVT. 4. Advanced aortoiliac atherosclerotic disease. Electronically Signed   By: Elgie Collard M.D.   On: 12/31/2017 23:06   US Venous Img Lower Bilateral  Result Date: 12/31/2017 CLINICAL DATA:  Bilateral lower extremity swelling for 1 week EXAM: BILATERAL LOWER EXTREMITY VENOUS DUPLEX ULTRASOUND TECHNIQUE: Doppler venous assessment of the bilateral lower extremity deep venous system was performed, including characterization of spectral flow, compressibility, and phasicity. COMPARISON:  None. FINDINGS: Right lower extremity: There is nonocclusive thrombus in the proximal right femoral vein. It is partially occlusive. The common femoral vein, mid right femoral vein, distal right femoral vein, and right popliteal vein are compressible. Doppler analysis demonstrates respiratory phasicity. Augmentation was deferred. Left lower extremity: There is complete compressibility of the left common femoral and femoral veins. The left popliteal vein is partially occlusive and contains partially occlusive thrombus. Doppler analysis demonstrates respiratory phasicity. Augmentation was deferred. IMPRESSION: The study is positive for bilateral DVT. There is partially occlusive thrombus in the  proximal right femoral vein and partially occlusive thrombus in the left popliteal vein. Electronically Signed   By: Jolaine Click M.D.   On: 12/31/2017 13:30   Dg Abd 2 Views  Result Date: 01/05/2018 CLINICAL DATA:  Distended abdomen EXAM: ABDOMEN - 2 VIEW COMPARISON:  CT 07/07/2016 FINDINGS: Markedly distended colon diffusely. Air-fluid levels right colon. Small bowel nondilated. No free air. IMPRESSION: Marked colonic distention compatible with ileus. Electronically Signed   By: Marlan Palau M.D.   On: 01/19/2018 20:37    Assessment: Principal Problem:   Bowel obstruction (HCC) Active Problems:   Hypokalemia   COPD (chronic obstructive pulmonary disease) (HCC)   Anxiety and depression   Hypomagnesemia   Abdominal distention   Acute deep vein thrombosis (DVT) of femoral vein of both lower extremities (HCC)      Plan: This patient had continued abdominal distention with a KUB showing continued and possibly worsening of her colonic distention.  The patient is not a candidate for any stenting due to her acute obstruction and unlikely being able to pass beyond the stricture/mass to put a stent.  I will discuss the case with surgery today seeing as her surgeon is in the operating room at the present time.  If the patient is not a candidate for surgery and a colonoscopy is desired then I would recommend stopping the heparin prior to doing the colonoscopy and her surgeon can then proceed with doing her colonoscopy.    LOS: 2 days   Yvette Martinez  01/27/2018, 9:25 AM  Note: This dictation was prepared with Dragon dictation along with smaller phrase technology. Any transcriptional errors that result from this process are unintentional.

## 2018-01-01 NOTE — Progress Notes (Signed)
eLink Physician-Brief Progress Note Patient Name: Yvette Martinez DOB: 04-07-1950 MRN: [id number]   Date of Service  01/19/2018  HPI/Events of Note  67 yo female with PMH of COPD, DM, Colonic Diverticula, Pneumonia and DVT in pregnancy. Now intubated and ventilated s/p ex lap for bowel obstruction. PCCM requested to help with ventilator and medical management. VSS.  eICU Interventions  No new orders.     Intervention Category Evaluation Type: New Patient Evaluation  Lenell Antu 01/15/2018, 11:10 PM

## 2018-01-01 NOTE — Anesthesia Procedure Notes (Signed)
Procedure Name: Intubation Performed by: Lesle Reek, CRNA Pre-anesthesia Checklist: Timeout performed, Patient being monitored, Suction available, Emergency Drugs available and Patient identified Patient Re-evaluated:Patient Re-evaluated prior to induction Oxygen Delivery Method: Circle system utilized Preoxygenation: Pre-oxygenation with 100% oxygen Induction Type: IV induction Laryngoscope Size: Mac and 3 Grade View: Grade I Tube type: Oral Tube size: 7.5 mm Number of attempts: 1 Airway Equipment and Method: Stylet Placement Confirmation: ETT inserted through vocal cords under direct vision,  breath sounds checked- equal and bilateral,  positive ETCO2 and CO2 detector Secured at: 21 cm Tube secured with: Tape

## 2018-01-02 ENCOUNTER — Encounter: Payer: Self-pay | Admitting: Radiology

## 2018-01-02 ENCOUNTER — Inpatient Hospital Stay: Payer: Medicare HMO

## 2018-01-02 DIAGNOSIS — K56609 Unspecified intestinal obstruction, unspecified as to partial versus complete obstruction: Secondary | ICD-10-CM

## 2018-01-02 DIAGNOSIS — R609 Edema, unspecified: Secondary | ICD-10-CM

## 2018-01-02 DIAGNOSIS — Z978 Presence of other specified devices: Secondary | ICD-10-CM

## 2018-01-02 DIAGNOSIS — J9601 Acute respiratory failure with hypoxia: Secondary | ICD-10-CM

## 2018-01-02 DIAGNOSIS — Z452 Encounter for adjustment and management of vascular access device: Secondary | ICD-10-CM

## 2018-01-02 DIAGNOSIS — A419 Sepsis, unspecified organism: Secondary | ICD-10-CM

## 2018-01-02 DIAGNOSIS — R9431 Abnormal electrocardiogram [ECG] [EKG]: Secondary | ICD-10-CM

## 2018-01-02 LAB — COMPREHENSIVE METABOLIC PANEL
ALBUMIN: 2.2 g/dL — AB (ref 3.5–5.0)
ALK PHOS: 70 U/L (ref 38–126)
ALT: 17 U/L (ref 0–44)
ALT: 19 U/L (ref 0–44)
ANION GAP: 10 (ref 5–15)
AST: 32 U/L (ref 15–41)
AST: 31 U/L (ref 15–41)
Albumin: 2 g/dL — ABNORMAL LOW (ref 3.5–5.0)
Alkaline Phosphatase: 64 U/L (ref 38–126)
Anion gap: 7 (ref 5–15)
BILIRUBIN TOTAL: 2.4 mg/dL — AB (ref 0.3–1.2)
BUN: 15 mg/dL (ref 8–23)
BUN: 17 mg/dL (ref 8–23)
CALCIUM: 6.7 mg/dL — AB (ref 8.9–10.3)
CHLORIDE: 108 mmol/L (ref 98–111)
CO2: 25 mmol/L (ref 22–32)
CO2: 23 mmol/L (ref 22–32)
CREATININE: 0.54 mg/dL (ref 0.44–1.00)
CREATININE: 0.5 mg/dL (ref 0.44–1.00)
Calcium: 6.5 mg/dL — ABNORMAL LOW (ref 8.9–10.3)
Chloride: 108 mmol/L (ref 98–111)
GFR calc Af Amer: >60 mL/min (ref >60)
GFR calc Af Amer: >60 mL/min (ref >60)
GFR calc non Af Amer: >60 mL/min (ref >60)
GLUCOSE: 147 mg/dL — AB (ref 70–99)
GLUCOSE: 156 mg/dL — AB (ref 70–99)
Potassium: 5 mmol/L (ref 3.5–5.1)
Potassium: 4.1 mmol/L (ref 3.5–5.1)
SODIUM: 140 mmol/L (ref 135–145)
SODIUM: 141 mmol/L (ref 135–145)
TOTAL PROTEIN: 3.5 g/dL — AB (ref 6.5–8.1)
Total Bilirubin: 1.9 mg/dL — ABNORMAL HIGH (ref 0.3–1.2)
Total Protein: 3.4 g/dL — ABNORMAL LOW (ref 6.5–8.1)

## 2018-01-02 LAB — FIBRIN DERIVATIVES D-DIMER (ARMC ONLY): Fibrin derivatives D-dimer (ARMC): 1347.05 ng/mL (FEU) — ABNORMAL HIGH (ref 0.00–499.00)

## 2018-01-02 LAB — CBC
HCT: 45.6 % (ref 36.0–46.0)
Hemoglobin: 15.7 g/dL — ABNORMAL HIGH (ref 12.0–15.0)
MCH: 32 pg (ref 26.0–34.0)
MCHC: 34.4 g/dL (ref 30.0–36.0)
MCV: 93.1 fL (ref 80.0–100.0)
PLATELETS: 156 10*3/uL (ref 150–400)
RBC: 4.9 MIL/uL (ref 3.87–5.11)
RDW: 16.7 % — AB (ref 11.5–15.5)
WBC: 3.4 10*3/uL — ABNORMAL LOW (ref 4.0–10.5)
nRBC: 0 % (ref 0.0–0.2)

## 2018-01-02 LAB — TROPONIN I
TROPONIN I: 0.05 ng/mL — AB (ref <0.03)
Troponin I: <0.03 ng/mL (ref <0.03)
Troponin I: <0.03 ng/mL (ref <0.03)

## 2018-01-02 LAB — PROTIME-INR
INR: 1.25
Prothrombin Time: 15.6 seconds — ABNORMAL HIGH (ref 11.4–15.2)

## 2018-01-02 LAB — MAGNESIUM
Magnesium: 1.6 mg/dL — ABNORMAL LOW (ref 1.7–2.4)
Magnesium: 1.6 mg/dL — ABNORMAL LOW (ref 1.7–2.4)
Magnesium: 2.2 mg/dL (ref 1.7–2.4)

## 2018-01-02 LAB — PHOSPHORUS
Phosphorus: 3.2 mg/dL (ref 2.5–4.6)
Phosphorus: 3.3 mg/dL (ref 2.5–4.6)

## 2018-01-02 LAB — LACTIC ACID, PLASMA: LACTIC ACID, VENOUS: 2.5 mmol/L — AB (ref 0.5–1.9)

## 2018-01-02 LAB — GLUCOSE, CAPILLARY
GLUCOSE-CAPILLARY: 144 mg/dL — AB (ref 70–99)
GLUCOSE-CAPILLARY: 74 mg/dL (ref 70–99)
Glucose-Capillary: 105 mg/dL — ABNORMAL HIGH (ref 70–99)
Glucose-Capillary: 112 mg/dL — ABNORMAL HIGH (ref 70–99)

## 2018-01-02 LAB — HEPARIN LEVEL (UNFRACTIONATED): Heparin Unfractionated: <0.1 IU/mL — ABNORMAL LOW (ref 0.30–0.70)

## 2018-01-02 LAB — FIBRINOGEN: FIBRINOGEN: 203 mg/dL — AB (ref 210–475)

## 2018-01-02 MED ORDER — LACTATED RINGERS IV BOLUS
1000.0000 mL | Freq: Once | INTRAVENOUS | Status: AC
  Administered 2018-01-02: 1000 mL via INTRAVENOUS

## 2018-01-02 MED ORDER — NOREPINEPHRINE 16 MG/250ML-% IV SOLN
0.0000 ug/min | INTRAVENOUS | Status: DC
  Administered 2018-01-02: 40 ug/min via INTRAVENOUS
  Administered 2018-01-02: 60 ug/min via INTRAVENOUS
  Administered 2018-01-02: 10 ug/min via INTRAVENOUS
  Filled 2018-01-02 (×7): qty 250

## 2018-01-02 MED ORDER — SODIUM CHLORIDE 0.9 % IV BOLUS
1000.0000 mL | Freq: Once | INTRAVENOUS | Status: AC
  Administered 2018-01-02: 1000 mL via INTRAVENOUS

## 2018-01-02 MED ORDER — VASOPRESSIN 20 UNIT/ML IV SOLN
0.0400 [IU]/min | INTRAVENOUS | Status: DC
  Administered 2018-01-02: 0.03 [IU]/min via INTRAVENOUS
  Filled 2018-01-02 (×2): qty 2

## 2018-01-02 MED ORDER — SODIUM CHLORIDE 0.9 % IV SOLN
1.0000 g | Freq: Once | INTRAVENOUS | Status: AC
  Administered 2018-01-02: 1 g via INTRAVENOUS
  Filled 2018-01-02: qty 1

## 2018-01-02 MED ORDER — SODIUM CHLORIDE 0.9 % IV SOLN
1.0000 g | Freq: Three times a day (TID) | INTRAVENOUS | Status: DC
  Administered 2018-01-02: 1 g via INTRAVENOUS
  Filled 2018-01-02 (×3): qty 1

## 2018-01-02 MED ORDER — HEPARIN (PORCINE) IN NACL 100-0.45 UNIT/ML-% IJ SOLN: 550.0000 [IU]/h | INTRAMUSCULAR | Status: DC

## 2018-01-02 MED ORDER — MAGNESIUM SULFATE 2 GM/50ML IV SOLN
2.0000 g | Freq: Once | INTRAVENOUS | Status: AC
  Administered 2018-01-02: 2 g via INTRAVENOUS
  Filled 2018-01-02: qty 50

## 2018-01-02 MED ORDER — PHENYLEPHRINE HCL-NACL 10-0.9 MG/250ML-% IV SOLN
0.0000 ug/min | INTRAVENOUS | Status: DC
  Administered 2018-01-02: 40 ug/min via INTRAVENOUS
  Filled 2018-01-02 (×2): qty 250

## 2018-01-02 MED ORDER — HYDROMORPHONE HCL 1 MG/ML IJ SOLN
INTRAMUSCULAR | Status: AC
  Filled 2018-01-02: qty 0.5

## 2018-01-02 MED ORDER — LACTATED RINGERS IV SOLN
INTRAVENOUS | Status: DC
  Administered 2018-01-02: 13:00:00 via INTRAVENOUS

## 2018-01-02 MED ORDER — IOPAMIDOL (ISOVUE-370) INJECTION 76%
125.0000 mL | Freq: Once | INTRAVENOUS | Status: AC | PRN
  Administered 2018-01-02: 125 mL via INTRAVENOUS

## 2018-01-02 MED ORDER — VANCOMYCIN HCL 10 G IV SOLR
1250.0000 mg | INTRAVENOUS | Status: DC
  Administered 2018-01-02: 1250 mg via INTRAVENOUS
  Filled 2018-01-02: qty 1250

## 2018-01-02 MED ORDER — SODIUM CHLORIDE 0.9 % IV SOLN
0.0000 ug/min | INTRAVENOUS | Status: DC
  Administered 2018-01-02: 200 ug/min via INTRAVENOUS
  Administered 2018-01-02 (×2): 400 ug/min via INTRAVENOUS
  Filled 2018-01-02: qty 40
  Filled 2018-01-02: qty 4
  Filled 2018-01-02: qty 40

## 2018-01-02 MED ORDER — GLYCOPYRROLATE 0.2 MG/ML IJ SOLN: 0.2000 mg | INTRAMUSCULAR | Status: DC | PRN

## 2018-01-02 MED ORDER — EPINEPHRINE PF 1 MG/ML IJ SOLN
0.5000 ug/min | INTRAVENOUS | Status: DC
  Administered 2018-01-02: 8 ug/min via INTRAVENOUS
  Filled 2018-01-02 (×3): qty 4

## 2018-01-02 MED ORDER — HYDROCORTISONE NA SUCCINATE PF 100 MG IJ SOLR
100.0000 mg | Freq: Three times a day (TID) | INTRAMUSCULAR | Status: DC
  Administered 2018-01-02: 100 mg via INTRAVENOUS
  Filled 2018-01-02: qty 2

## 2018-01-02 MED ORDER — BUDESONIDE 0.5 MG/2ML IN SUSP
0.5000 mg | Freq: Two times a day (BID) | RESPIRATORY_TRACT | Status: DC
  Administered 2018-01-02: 0.5 mg via RESPIRATORY_TRACT
  Filled 2018-01-02: qty 2

## 2018-01-02 MED ORDER — INSULIN ASPART 100 UNIT/ML ~~LOC~~ SOLN: 0.0000 [IU] | SUBCUTANEOUS | Status: DC

## 2018-01-02 MED ORDER — LORAZEPAM 2 MG/ML IJ SOLN: 1.0000 mg | INTRAMUSCULAR | Status: DC | PRN

## 2018-01-02 MED ORDER — VANCOMYCIN HCL IN DEXTROSE 1-5 GM/200ML-% IV SOLN
1000.0000 mg | Freq: Once | INTRAVENOUS | Status: AC
  Administered 2018-01-02: 1000 mg via INTRAVENOUS
  Filled 2018-01-02: qty 200

## 2018-01-03 ENCOUNTER — Encounter: Payer: Self-pay | Admitting: Specialist

## 2018-01-03 LAB — TYPE AND SCREEN
ABO/RH(D): A NEG
ANTIBODY SCREEN: NEGATIVE
UNIT DIVISION: 0
UNIT DIVISION: 0
Unit division: 0
Unit division: 0

## 2018-01-03 LAB — BPAM RBC
BLOOD PRODUCT EXPIRATION DATE: 201911062359
BLOOD PRODUCT EXPIRATION DATE: 201911182359
Blood Product Expiration Date: 201911182359
Blood Product Expiration Date: 201911222359
ISSUE DATE / TIME: 201910271631
ISSUE DATE / TIME: 201911032037
ISSUE DATE / TIME: 201911032037
UNIT TYPE AND RH: 600
UNIT TYPE AND RH: 9500
Unit Type and Rh: 600
Unit Type and Rh: 600

## 2018-01-03 LAB — CEA: CEA: 3.7 ng/mL (ref 0.0–4.7)

## 2018-01-03 LAB — PREPARE RBC (CROSSMATCH)

## 2018-01-03 LAB — SURGICAL PATHOLOGY

## 2018-01-03 NOTE — Progress Notes (Signed)
[employer] [facility] -  at [facility]   PATIENT NAME: Coretha Creswell    MR#:  [id number]  DATE OF BIRTH:  1950-10-18  SUBJECTIVE:  CHIEF COMPLAINT:   Chief Complaint  Patient presents with  . Leg Swelling  . abdominal distention  Loosing weight and constipated for last few weeks. Noted severely hypokalemia and swelling both legs.  Worsening on Distention, plan for surgery today.  REVIEW OF SYSTEMS:  CONSTITUTIONAL: No fever,have fatigue or weakness.  EYES: No blurred or double vision.  EARS, NOSE, AND THROAT: No tinnitus or ear pain.  RESPIRATORY: No cough, shortness of breath, wheezing or hemoptysis.  CARDIOVASCULAR: No chest pain, orthopnea, edema.  GASTROINTESTINAL: No nausea, vomiting, diarrhea or have some abdominal pain.  GENITOURINARY: No dysuria, hematuria.  ENDOCRINE: No polyuria, nocturia,  HEMATOLOGY: No anemia, easy bruising or bleeding SKIN: No rash or lesion. MUSCULOSKELETAL: No joint pain or arthritis.   NEUROLOGIC: No tingling, numbness, weakness.  PSYCHIATRY: No anxiety or depression.   ROS  DRUG ALLERGIES:   Allergies  Allergen Reactions  . Penicillins Rash and Other (See Comments)    Has patient had a PCN reaction causing immediate rash, facial/tongue/throat swelling, SOB or lightheadedness with hypotension: Unknown Has patient had a PCN reaction causing severe rash involving mucus membranes or skin necrosis: Unknown Has patient had a PCN reaction that required hospitalization: No Has patient had a PCN reaction occurring within the last 10 years: No If all of the above answers are "NO", then may proceed with Cephalosporin use.   . Venlafaxine Swelling  . Sulfa Antibiotics Hives    VITALS:  Blood pressure (!) 83/58, pulse (!) 125, temperature 99.1 F (37.3 C), resp. rate 18, height 5\' 2"  (1.575 m), weight 62.5 kg, SpO2 98 %.  PHYSICAL EXAMINATION:  GENERAL:  67 y.o.-year-old severe malnourished patient lying in the bed with no  acute distress.  EYES: Pupils equal, round, reactive to light and accommodation. No scleral icterus. Extraocular muscles intact.  HEENT: Head atraumatic, normocephalic. Oropharynx and nasopharynx clear.  NECK:  Supple, no jugular venous distention. No thyroid enlargement, no tenderness.  LUNGS: Normal breath sounds bilaterally, no wheezing, rales,rhonchi or crepitation. No use of accessory muscles of respiration.  CARDIOVASCULAR: S1, S2 normal. No murmurs, rubs, or gallops.  ABDOMEN: Soft, nontender, distended. Bowel sounds sluggish. No organomegaly or mass.  EXTREMITIES: b/l pedal edema,no cyanosis, or clubbing.  NEUROLOGIC: Cranial nerves II through XII are intact. Muscle strength 4/5 in all extremities. Sensation intact. Gait not checked.  PSYCHIATRIC: The patient is alert and oriented x 3.  SKIN: No obvious rash, lesion, or ulcer.   Physical Exam LABORATORY PANEL:   CBC Recent Labs  Lab 01/09/2018 0409  WBC 3.4*  HGB 15.7*  HCT 45.6  PLT 156   ------------------------------------------------------------------------------------------------------------------  Chemistries  Recent Labs  Lab 01/25/2018 0409 01/22/2018 0552  NA 140  --   K 4.1  --   CL 108  --   CO2 25  --   GLUCOSE 147*  --   BUN 17  --   CREATININE 0.54  --   CALCIUM 6.5*  --   MG 1.6* 2.2  AST 31  --   ALT 17  --   ALKPHOS 64  --   BILITOT 1.9*  --    ------------------------------------------------------------------------------------------------------------------  Cardiac Enzymes Recent Labs  Lab 01/15/2018 0858 01/09/2018 1515  TROPONINI <0.03 0.05*   ------------------------------------------------------------------------------------------------------------------  RADIOLOGY:  Dg Abd 1 View  Result Date: 01/27/2018 CLINICAL DATA:  Bowel  obstruction.  Abdominal pain and discomfort. EXAM: ABDOMEN - 1 VIEW COMPARISON:  CT of the abdomen and pelvis 01/08/2018 FINDINGS: Marked dilation of the colon is  again noted. There is no significant interval change. A J-tube is coiled in the stomach. No free air is present. IMPRESSION: Stable distal colonic obstruction. Electronically Signed   By: Marin Roberts M.D.   On: 01/28/2018 10:55   Ct Angio Ao+bifem W & Or Wo Contrast  Result Date: 2018-01-05 CLINICAL DATA:  Left leg cold and painful with sudden onset. EXAM: CT ANGIOGRAPHY OF ABDOMINAL AORTA WITH ILIOFEMORAL RUNOFF TECHNIQUE: Multidetector CT imaging of the abdomen, pelvis and lower extremities was performed using the standard protocol during bolus administration of intravenous contrast. Multiplanar CT image reconstructions and MIPs were obtained to evaluate the vascular anatomy. CONTRAST:  ISOVUE-370 IOPAMIDOL (ISOVUE-370) INJECTION 76% COMPARISON:  CT abdomen and pelvis 01/25/2018 FINDINGS: VASCULAR Aorta: Normal caliber. No aneurysm, dissection, or occlusion. Aortic calcifications. Celiac: Celiac axis is somewhat diminutive with calcification at the origin. Appears patent. Likely proximal stenosis. SMA: Patent without evidence of aneurysm, dissection, vasculitis or significant stenosis. Renals: Both renal arteries are patent without evidence of aneurysm, dissection, vasculitis, fibromuscular dysplasia or significant stenosis. IMA: Patent without evidence of aneurysm, dissection, vasculitis or significant stenosis. RIGHT Lower Extremity Inflow: Common, internal, and external iliac arteries are patent. No aneurysm or occlusion. Outflow: Common, superficial and profunda femoral arteries and the popliteal artery are patent without evidence of aneurysm, dissection, vasculitis or significant stenosis. Runoff: Patent three vessel runoff to the ankle. LEFT Lower Extremity Inflow: Left common iliac artery is patent. There is occlusion or thrombosis of the left external iliac artery just past its origin. No flow is demonstrated throughout most of the left external iliac artery. There is reconstitution at  the origin of the left common femoral artery. Outflow: Common, superficial and profunda femoral arteries and the popliteal artery are patent without evidence of aneurysm, dissection, vasculitis or significant stenosis. Runoff: Minimal flow demonstrated in the popliteal artery, and tibial trunk. No flow demonstrated in the trifurcation vessels. This is likely due to decreased inflow. Veins: Inferior vena caval filter. No obvious venous abnormality within the limitations of this arterial phase study. Review of the MIP images confirms the above findings. NON-VASCULAR Lower chest: Atelectasis in the lung bases. Minimal bilateral pleural effusions. Hepatobiliary: Incomplete visualization of the liver due to field of view. No definite focal lesions. Gallbladder and bile ducts are poorly visualized. Pancreas: Appears grossly unremarkable. Peripancreatic edema is likely related to a more diffuse process rather than pancreatitis. Spleen: Normal in size without focal abnormality. Adrenals/Urinary Tract: Adrenal glands are unremarkable. Kidneys are normal, without renal calculi, focal lesion, or hydronephrosis. Bladder is decompressed with a Foley catheter in place. Gas in the bladder consistent with catheter insertion. Residual contrast material in the bladder likely arising from a previous contrast administration as no contrast is currently demonstrated in the renal collecting system or ureters . Stomach/Bowel: Enteric tube tip in the body of the stomach. Fluid-filled stomach without significant wall thickening. Small bowel and colon are mainly decompressed. There is evidence of distal small bowel wall thickening without pneumatosis. Diffuse colonic wall thickening with pericolonic edema and fold thickening. Left lower quadrant descending colostomy. Lymphatic: Scattered lymph nodes are not pathologically enlarged. Reproductive: Status post hysterectomy. No adnexal masses. Other: Free air and free fluid in the abdomen, likely  postoperative. Soft tissue gas is present in the anterior abdominal wall, also likely postoperative. Surgical drain in the pelvis.  Skin clips along the anterior abdominal wall. Bilateral lower extremity edema. Musculoskeletal: No destructive bone lesions. 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 Minimal flow demonstrated in the left popliteal artery and runoff vessels likely due to decreased inflow. NON-VASCULAR 1. Diffuse colonic wall thickening with pericolonic edema and fold thickening and wall thickening of the distal small bowel consistent with enterocolitis. Free fluid and free air in the abdomen and pelvis, likely postoperative. 2. Atelectasis in the lung bases. Minimal bilateral pleural effusions. These results were called by telephone at the time of interpretation on 01/11/2018 at 2:32 am to ICU nurse [facility] , who verbally acknowledged these results. Electronically Signed   By: Burman Nieves M.D.   On: 01/28/2018 02:36   Dg Chest Port 1 View  Result Date: 01/26/2018 CLINICAL DATA:  Line placements EXAM: PORTABLE CHEST 1 VIEW COMPARISON:  01/17/2018 FINDINGS: Endotracheal tube tip measures 3.7 cm above the carina. Enteric tube tip is off the field of view but below the left hemidiaphragm. A left central venous catheter has been placed with tip over the cavoatrial junction region. No pneumothorax. Heart size and pulmonary vascularity are normal. Emphysematous changes in the lungs. No airspace disease or consolidation. No blunting of costophrenic angles. Calcification of the aorta. IMPRESSION: Appliances appear in satisfactory location. Emphysematous changes in the lungs. No evidence of active pulmonary disease. Electronically Signed   By: Burman Nieves M.D.   On: 01/25/2018 05:04   Portable Chest X-ray  Result Date: 01/18/2018 CLINICAL DATA:  History of COPD and DVT.  ETT. EXAM: PORTABLE  CHEST 1 VIEW COMPARISON:  None. FINDINGS: The ETT is in good position. The NG tube terminates below today's film. The heart, hila, mediastinum, lungs, and pleura are otherwise unchanged and unremarkable. IMPRESSION: Support apparatus as above.  No acute abnormalities. Electronically Signed   By: Gerome Sam III M.D   On: 12/31/2017 23:43    ASSESSMENT AND PLAN:   Principal Problem:   Bowel obstruction (HCC) Active Problems:   Hypokalemia   COPD (chronic obstructive pulmonary disease) (HCC)   Anxiety and depression   Hypomagnesemia   Abdominal distention   Acute deep vein thrombosis (DVT) of femoral vein of both lower extremities (HCC)   Pulmonary embolus (HCC)  * Bowel obstruction (HCC) -General surgery consulted   NG tube suction.   Also getting GI consult to have decompression colonoscopy.   GI suggested no colonoscopy this time, due to worsening on distension- Plan is for surgery today.  *  Hypokalemia -significant hypokalemia, we will replace this and monitor   Improved.  * b/l DVT LL    Started on heparine drip.    Checked for PE by CT angio. Positive.   Called oncology consult as New DVT and there seems to have some mass in colon.  *  COPD (chronic obstructive pulmonary disease) (HCC) -home dose medications  *  Hypomagnesemia -replace and monitor  *  Anxiety and depression -home dose anxiolytic and antidepressant  * severe malnutrition  Once obstruction resolved, will need dietary consult.  All the records are reviewed and case discussed with Care Management/Social Workerr. Management plans discussed with the patient, family and they are in agreement.  CODE STATUS: Full.  TOTAL TIME TAKING CARE OF THIS PATIENT: 35 minutes.     POSSIBLE D/C IN 1-2 DAYS, DEPENDING ON CLINICAL CONDITION.   Altamese Dilling M.D on 01/03/2018   Between 7am to  6pm - Pager - [phone]  After 6pm go to [email or web address] - password [email or web address]  [facility]   Office  [phone]  CC: Primary care physician; Care, [facility]  Note: This dictation was prepared with Dragon dictation along with smaller phrase technology. Any transcriptional errors that result from this process are unintentional.

## 2018-01-05 ENCOUNTER — Telehealth: Payer: Self-pay | Admitting: Internal Medicine

## 2018-01-05 NOTE — Telephone Encounter (Signed)
Death certificate received and placed in MD box for signing.

## 2018-01-05 NOTE — Telephone Encounter (Signed)
[employer] dropped off Death Certificate to be completed and signed °Placed in nurse box  °

## 2018-01-06 NOTE — Telephone Encounter (Signed)
Death certificate completed, and placed at front desk for pickup. Funeral home notified. Nothing further needed.

## 2018-01-29 NOTE — Progress Notes (Signed)
Chaplain provided emotional and spiritual support prior to, during and after death. Family worked well together to move towards comfort care.     01/14/2018 2100  Clinical Encounter Type  Visited With Patient and family together  Visit Type Initial  Referral From Nurse  Spiritual Encounters  Spiritual Needs Emotional;Grief support

## 2018-01-29 NOTE — Death Summary Note (Addendum)
DEATH SUMMARY   Patient Details  Name: Yvette Martinez MRN: [id number] DOB: 23-Jan-1951  Admission/Discharge Information   Admit Date:  2018/01/09  Date of Death:  12-Jan-2018  Time of Death:  07-16-13  Length of Stay: 3  Referring Physician: Care, [place] [facility]   Reason(s) for Hospitalization  Constipation   Diagnoses  Preliminary cause of death: Septic shock (HCC) Secondary Diagnoses (including complications and co-morbidities):  Principal Problem:   Bowel obstruction (HCC) Active Problems:   Hypokalemia   COPD (chronic obstructive pulmonary disease) (HCC)   Anxiety and depression   Hypomagnesemia   Abdominal distention   Acute deep vein thrombosis (DVT) of femoral vein of both lower extremities (HCC)   Pulmonary embolus (HCC)   Left leg ischemia   IVC filter placement    Bowel obstruction s/p exploratory laparotomy with Hartmann's Procedure    Type II Diabetes Mellitus   Septic shock secondary to bowel obstruction   Brief Hospital Course (including significant findings, care, treatment, and services provided and events leading to death)  Yvette Martinez is a 67 y.o. year old female who presented to the ED with complaints of 4 to 6 weeks of constipation, nausea, vomiting, poor appetite and lower extremity swelling.  Her ED work-up revealed a bowel obstruction.  Surgery was consulted and patient was taken for an exploratory laparotomy on 01/18/2018 and underwent a Hartmann's procedure, abdominal washout, and lysis of adhesions.  She remained mechanically intubated postop, and was subsequently admitted to the ICU for additional workup and treatment.  Postoperatively, she developed acute leg ischemia due to left external iliac artery occlusion as well as septic shock requiring pressors.  She was evaluated by vascular surgery on Jan 12, 2018, however due to multiple vasopressor requirements secondary to severe hypotension she was deemed too unstable for a femoral to femoral bypass  procedure.  On 01/12/2018 she remained severely hypotensive despite maximum doses of 4 vasopressors and stress dose steroids, pts code status changed to DNR by her family.  Despite aggressive treatment the pt continued to decline pts son Yvette Martinez, Yvette Martinez and additional family members decided to transition pt to comfort measures only.  She expired with family members at bedside on 01-12-2018 at 07-16-13.  Pertinent Labs and Studies  Significant Diagnostic Studies Dg Chest 2 View  Result Date: 2018-01-09 CLINICAL DATA:  Bilateral lower extremity edema and abdominal distension. EXAM: CHEST - 2 VIEW COMPARISON:  Chest CT 06/08/2017 FINDINGS: Cardiomediastinal silhouette is normal. Mediastinal contours appear intact. Calcific atherosclerotic disease of the aorta. There is no evidence of focal airspace consolidation, pleural effusion or pneumothorax. Hyperinflation of the lungs. Mild bronchiectasis and peribronchial thickening, similar to the prior study. Osseous structures are without acute abnormality. Soft tissues are grossly normal. IMPRESSION: Mild bronchiectasis and peribronchial thickening, similar to the prior study. Hyperinflation of the lungs. No evidence of acute lobar airspace consolidation or pulmonary edema. Electronically Signed   By: Ted Mcalpine M.D.   On: 01-09-18 20:37   Dg Abd 1 View  Result Date: 01/19/2018 CLINICAL DATA:  Bowel obstruction.  Abdominal pain and discomfort. EXAM: ABDOMEN - 1 VIEW COMPARISON:  CT of the abdomen and pelvis 2018-01-09 FINDINGS: Marked dilation of the colon is again noted. There is no significant interval change. A J-tube is coiled in the stomach. No free air is present. IMPRESSION: Stable distal colonic obstruction. Electronically Signed   By: Marin Roberts M.D.   On: 01/22/2018 10:55   Ct Angio Chest Pe W Or Wo Contrast  Result Date:  12/31/2017 CLINICAL DATA:  History of deep venous thrombosis and difficulty breathing. EXAM: CT ANGIOGRAPHY  CHEST WITH CONTRAST TECHNIQUE: Multidetector CT imaging of the chest was performed using the standard protocol during bolus administration of intravenous contrast. Multiplanar CT image reconstructions and MIPs were obtained to evaluate the vascular anatomy. CONTRAST:  75mL ISOVUE-370 IOPAMIDOL (ISOVUE-370) INJECTION 76% COMPARISON:  None. FINDINGS: Cardiovascular: Thoracic aorta is within normal limits without aneurysmal dilatation or dissection. No cardiac enlargement is seen. Mild coronary calcifications are noted. Pulmonary artery is well visualized within normal branching pattern. Few small filling defects are noted within the left upper lobe pulmonary arterial branches consistent with pulmonary emboli. No right heart strain is seen. No significant central emboli are seen. Mediastinum/Nodes: Nasogastric catheter is identified extending into the stomach are present. The esophagus is within normal limits. No hilar or mediastinal adenopathy is noted. The thoracic inlet is unremarkable. Lungs/Pleura: Lungs are aerated bilaterally without evidence of focal infiltrate or sizable effusion. Mild mucous plugging is noted in the right lower lobe without significant consolidation or collapse. No sizable parenchymal nodules are noted. Upper Abdomen: Visualized upper abdomen demonstrates significant fecal material throughout the colon. This is similar to that seen on prior CT examination. Musculoskeletal: Degenerative changes of the thoracic spine are noted. Review of the MIP images confirms the above findings. IMPRESSION: Small pulmonary emboli within the left upper lobe pulmonary arterial branches without right heart strain. Persistent changes of constipation similar to that seen on recent CT of the abdomen. Mild mucous plugging without significant consolidation. These results will be called to the ordering clinician or representative by the Radiologist Assistant, and communication documented in the PACS or zVision  Dashboard. Electronically Signed   By: Alcide Clever M.D.   On: 12/31/2017 15:37   Ct Abdomen Pelvis W Contrast  Result Date: 01/14/2018 CLINICAL DATA:  67 year old female with abdominal pain. History of sigmoid diverticulitis. EXAM: CT ABDOMEN AND PELVIS WITH CONTRAST TECHNIQUE: Multidetector CT imaging of the abdomen and pelvis was performed using the standard protocol following bolus administration of intravenous contrast. CONTRAST:  75mL OMNIPAQUE IOHEXOL 300 MG/ML  SOLN COMPARISON:  Abdominal radiograph dated 01/19/2018 and CT dated 07/07/2016 FINDINGS: Lower chest: Right lung base scarring primarily involving the right middle lobe noted. The left lung base is clear. No intra-abdominal free air or free fluid. Hepatobiliary: No focal liver abnormality is seen. No gallstones, gallbladder wall thickening, or biliary dilatation. Pancreas: Unremarkable. No pancreatic ductal dilatation or surrounding inflammatory changes. Spleen: Normal in size without focal abnormality. Adrenals/Urinary Tract: Adrenal glands are unremarkable. Kidneys are normal, without renal calculi, focal lesion, or hydronephrosis. Bladder is unremarkable. Stomach/Bowel: There is diffuse severe distention of the colon with gas and semi-solid fecal matter. The transverse colon measure up to 9 cm in diameter. The distal sigmoid and rectum are collapsed. There is a transition zone in the sigmoid colon (series 2, image 64 and coronal series 5, image 45) in the area of prior diverticulitis. Findings likely represent an obstruction secondary to stricture or adhesion, related to prior inflammatory process, although a mass is not excluded. A pseudo-obstruction or adynamic ileus are other consideration. There is no evidence of colonic twisting. There is sigmoid diverticulosis without active inflammatory changes. There is mild air distention of the distal small bowel and terminal ileum. Mildly thickened appearance of the distal small bowel loops in the  pelvis may be reactive or represent enteritis. No evidence of small-bowel obstruction. There is no pneumatosis at this time. The appendix is unremarkable. Vascular/Lymphatic:  Advanced aortoiliac atherosclerotic disease. Apparent low attenuating content within the proximal right femoral vein (series 2, image 87) likely mixing artifact. O'clock is less likely. Duplex ultrasound may provide better evaluation if there is clinical concern for DVT. The SMV, splenic vein, and main portal vein are patent. No portal venous gas. There is no adenopathy. Reproductive: Hysterectomy. No pelvic mass. Other: There is midline vertical anterior pelvic wall incisional scar. Subcutaneous edema of the left thigh. Musculoskeletal: Osteopenia with degenerative changes of the spine. No acute osseous pathology. IMPRESSION: 1. Severe distention of the colon with transition at the sigmoid, possibly secondary to stricture and obstruction versus less likely a pseudo-obstruction or an ileus. Clinical correlation and surgical consult is advised. No pneumatosis or portal venous gas. 2. Mildly thickened appearance of the distal small bowel loops in the pelvis may be reactive or represent enteritis. No evidence of small-bowel obstruction. Normal appendix. 3. Artifact versus less likely a right femoral vein DVT. 4. Advanced aortoiliac atherosclerotic disease. Electronically Signed   By: Elgie Collard M.D.   On: 01/13/2018 23:06   Ct Angio Ao+bifem W & Or Wo Contrast  Result Date: 01/25/2018 CLINICAL DATA:  Left leg cold and painful with sudden onset. EXAM: CT ANGIOGRAPHY OF ABDOMINAL AORTA WITH ILIOFEMORAL RUNOFF TECHNIQUE: Multidetector CT imaging of the abdomen, pelvis and lower extremities was performed using the standard protocol during bolus administration of intravenous contrast. Multiplanar CT image reconstructions and MIPs were obtained to evaluate the vascular anatomy. CONTRAST:  ISOVUE-370 IOPAMIDOL (ISOVUE-370) INJECTION 76%  COMPARISON:  CT abdomen and pelvis 01/11/2018 FINDINGS: VASCULAR Aorta: Normal caliber. No aneurysm, dissection, or occlusion. Aortic calcifications. Celiac: Celiac axis is somewhat diminutive with calcification at the origin. Appears patent. Likely proximal stenosis. SMA: Patent without evidence of aneurysm, dissection, vasculitis or significant stenosis. Renals: Both renal arteries are patent without evidence of aneurysm, dissection, vasculitis, fibromuscular dysplasia or significant stenosis. IMA: Patent without evidence of aneurysm, dissection, vasculitis or significant stenosis. RIGHT Lower Extremity Inflow: Common, internal, and external iliac arteries are patent. No aneurysm or occlusion. Outflow: Common, superficial and profunda femoral arteries and the popliteal artery are patent without evidence of aneurysm, dissection, vasculitis or significant stenosis. Runoff: Patent three vessel runoff to the ankle. LEFT Lower Extremity Inflow: Left common iliac artery is patent. There is occlusion or thrombosis of the left external iliac artery just past its origin. No flow is demonstrated throughout most of the left external iliac artery. There is reconstitution at the origin of the left common femoral artery. Outflow: Common, superficial and profunda femoral arteries and the popliteal artery are patent without evidence of aneurysm, dissection, vasculitis or significant stenosis. Runoff: Minimal flow demonstrated in the popliteal artery, and tibial trunk. No flow demonstrated in the trifurcation vessels. This is likely due to decreased inflow. Veins: Inferior vena caval filter. No obvious venous abnormality within the limitations of this arterial phase study. Review of the MIP images confirms the above findings. NON-VASCULAR Lower chest: Atelectasis in the lung bases. Minimal bilateral pleural effusions. Hepatobiliary: Incomplete visualization of the liver due to field of view. No definite focal lesions. Gallbladder  and bile ducts are poorly visualized. Pancreas: Appears grossly unremarkable. Peripancreatic edema is likely related to a more diffuse process rather than pancreatitis. Spleen: Normal in size without focal abnormality. Adrenals/Urinary Tract: Adrenal glands are unremarkable. Kidneys are normal, without renal calculi, focal lesion, or hydronephrosis. Bladder is decompressed with a Foley catheter in place. Gas in the bladder consistent with catheter insertion. Residual contrast material in  the bladder likely arising from a previous contrast administration as no contrast is currently demonstrated in the renal collecting system or ureters . Stomach/Bowel: Enteric tube tip in the body of the stomach. Fluid-filled stomach without significant wall thickening. Small bowel and colon are mainly decompressed. There is evidence of distal small bowel wall thickening without pneumatosis. Diffuse colonic wall thickening with pericolonic edema and fold thickening. Left lower quadrant descending colostomy. Lymphatic: Scattered lymph nodes are not pathologically enlarged. Reproductive: Status post hysterectomy. No adnexal masses. Other: Free air and free fluid in the abdomen, likely postoperative. Soft tissue gas is present in the anterior abdominal wall, also likely postoperative. Surgical drain in the pelvis. Skin clips along the anterior abdominal wall. Bilateral lower extremity edema. Musculoskeletal: No destructive bone lesions. 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 Minimal flow demonstrated in the left popliteal artery and runoff vessels likely due to decreased inflow. NON-VASCULAR 1. Diffuse colonic wall thickening with pericolonic edema and fold thickening and wall thickening of the distal small bowel consistent with enterocolitis. Free fluid and free air in the abdomen and pelvis, likely postoperative. 2.  Atelectasis in the lung bases. Minimal bilateral pleural effusions. These results were called by telephone at the time of interpretation on January 11, 2018 at 2:32 am to ICU nurse [facility] , who verbally acknowledged these results. Electronically Signed   By: Burman Nieves M.D.   On: 01/11/18 02:36   US Venous Img Lower Bilateral  Result Date: 12/31/2017 CLINICAL DATA:  Bilateral lower extremity swelling for 1 week EXAM: BILATERAL LOWER EXTREMITY VENOUS DUPLEX ULTRASOUND TECHNIQUE: Doppler venous assessment of the bilateral lower extremity deep venous system was performed, including characterization of spectral flow, compressibility, and phasicity. COMPARISON:  None. FINDINGS: Right lower extremity: There is nonocclusive thrombus in the proximal right femoral vein. It is partially occlusive. The common femoral vein, mid right femoral vein, distal right femoral vein, and right popliteal vein are compressible. Doppler analysis demonstrates respiratory phasicity. Augmentation was deferred. Left lower extremity: There is complete compressibility of the left common femoral and femoral veins. The left popliteal vein is partially occlusive and contains partially occlusive thrombus. Doppler analysis demonstrates respiratory phasicity. Augmentation was deferred. IMPRESSION: The study is positive for bilateral DVT. There is partially occlusive thrombus in the proximal right femoral vein and partially occlusive thrombus in the left popliteal vein. Electronically Signed   By: Jolaine Click M.D.   On: 12/31/2017 13:30   Dg Chest Port 1 View  Result Date: 01/11/18 CLINICAL DATA:  Line placements EXAM: PORTABLE CHEST 1 VIEW COMPARISON:  12/31/2017 FINDINGS: Endotracheal tube tip measures 3.7 cm above the carina. Enteric tube tip is off the field of view but below the left hemidiaphragm. A left central venous catheter has been placed with tip over the cavoatrial junction region. No pneumothorax. Heart size and pulmonary  vascularity are normal. Emphysematous changes in the lungs. No airspace disease or consolidation. No blunting of costophrenic angles. Calcification of the aorta. IMPRESSION: Appliances appear in satisfactory location. Emphysematous changes in the lungs. No evidence of active pulmonary disease. Electronically Signed   By: Burman Nieves M.D.   On: Jan 11, 2018 05:04   Portable Chest X-ray  Result Date: 01/04/2018 CLINICAL DATA:  History of COPD and DVT.  ETT. EXAM: PORTABLE CHEST 1 VIEW COMPARISON:  None. FINDINGS: The ETT is in good position. The NG tube terminates below today's film. The heart, hila, mediastinum, lungs, and  pleura are otherwise unchanged and unremarkable. IMPRESSION: Support apparatus as above.  No acute abnormalities. Electronically Signed   By: Gerome Sam III M.D   On: 01/13/2018 23:43   Dg Abd 2 Views  Result Date: 01-10-18 CLINICAL DATA:  Distended abdomen EXAM: ABDOMEN - 2 VIEW COMPARISON:  CT 07/07/2016 FINDINGS: Markedly distended colon diffusely. Air-fluid levels right colon. Small bowel nondilated. No free air. IMPRESSION: Marked colonic distention compatible with ileus. Electronically Signed   By: Marlan Palau M.D.   On: 01/10/18 20:37    Microbiology Recent Results (from the past 240 hour(s))  MRSA PCR Screening     Status: None   Collection Time: 01/27/2018  1:39 PM  Result Value Ref Range Status   MRSA by PCR NEGATIVE NEGATIVE Final    Comment:        The GeneXpert MRSA Assay (FDA approved for NASAL specimens only), is one component of a comprehensive MRSA colonization surveillance program. It is not intended to diagnose MRSA infection nor to guide or monitor treatment for MRSA infections. Performed at [facility], [address] [place], [place] [address]     Lab Basic Metabolic Panel: Recent Labs  Lab 01-10-2018 2036 12/31/17 0451 12/31/17 1302 01/07/2018 0605 01/19/2018 0050 01/25/2018 0409 01/10/2018 0552  NA 137 138  --  140 141  140  --   K <2.0* <2.0* 2.1* 4.8 5.0 4.1  --   CL 87* 88*  --  101 108 108  --   CO2 35* 36*  --  25 23 25   --   GLUCOSE 98 83  --  72 156* 147*  --   BUN 19 17  --  18 15 17   --   CREATININE 0.84 0.80  --  0.65 0.50 0.54  --   CALCIUM 7.7* 7.4*  --  7.5* 6.7* 6.5*  --   MG 1.5*  --  2.7*  --  1.6* 1.6* 2.2  PHOS  --   --   --   --  3.2 3.3  --    Liver Function Tests: Recent Labs  Lab 2018/01/10 2036 01/26/2018 0050 01/25/2018 0409  AST 40 32 31  ALT 28 19 17   ALKPHOS 116 70 64  BILITOT 1.6* 2.4* 1.9*  PROT 5.2* 3.5* 3.4*  ALBUMIN 2.8* 2.2* 2.0*   No results for input(s): LIPASE, AMYLASE in the last 168 hours. No results for input(s): AMMONIA in the last 168 hours. CBC: Recent Labs  Lab 01/10/18 2000 12/31/17 0451 01/16/2018 0605 01/08/2018 0409  WBC 12.9* 10.5 10.9* 3.4*  NEUTROABS 10.7*  --   --   --   HGB 15.1* 14.9 14.7 15.7*  HCT 43.1 43.0 43.6 45.6  MCV 93.1 93.9 97.3 93.1  PLT 225 196 216 156   Cardiac Enzymes: Recent Labs  Lab 01/10/18 2036 01/05/2018 0409 01/19/2018 0858 01/19/2018 1515  TROPONINI 0.04* <0.03 <0.03 0.05*   Sepsis Labs: Recent Labs  Lab January 10, 2018 2000 12/31/17 0451 12/31/2017 0605 01/04/2018 0409  WBC 12.9* 10.5 10.9* 3.4*  LATICACIDVEN  --   --   --  2.5*    Procedures/Operations  IVC filter placement  Exploratory laparotomy with Hartmann's Procedure  Mechanical intubation  Left internal jugular CVL   Sonda Rumble, AGNP  Pulmonary/Critical Care Pager [phone] (please enter 7 digits) PCCM Consult Pager [phone] (please enter 7 digits)

## 2018-01-29 NOTE — Progress Notes (Signed)
INTERVAL UPDATE:  Post op exam noted increased swelling in left leg as expected but difficulty finding arterial pulse.  Leg also slightly more discolored and cooler to touch compared to right.  Despite doppler and bedside US study by me, could not confidently say arterial flow was noted in any study or exam, compared to right.   Due to the repair that needed to be done in proximity to the left external iliac artery intraop, vascular surgeon notified of concerns for possible arterial injury causing acute leg ischemia.  He recommended CT angio to assess if any injury indeed occurred.  Rads confirmed repeat dose of contrast for study ok since last study was over a day ago and patient kidney function wnl.  Pt currently in CT, pending results.

## 2018-01-29 NOTE — Progress Notes (Signed)
Subjective:  CC:  Yvette Martinez is a 67 y.o. female  Hospital stay day 3, 1 Day Post-Op ex lap, sigmoid colectomy, primary bladder repair, ostomy placement  HPI: Intubated, sedated, on three pressors at time of exam this pm.  Family members at bedside  ROS:  Unable to perform due to patient status   Objective:      Temp:  [97 F (36.1 C)-99.1 F (37.3 C)] 99.1 F (37.3 C) (11/04 1525) Pulse Rate:  [95-128] 125 (11/04 1952) Resp:  [12-40] 18 (11/04 1952) BP: (57-132)/(18-90) 83/58 (11/04 1846) SpO2:  [94 %-100 %] 98 % (11/04 1952) FiO2 (%):  [40 %] 40 % (11/04 1952) Weight:  [62.5 kg] 62.5 kg (11/03 2245)     Height: 5\' 2"  (157.5 cm) Weight: 62.5 kg BMI (Calculated): 25.2   Intake/Output this shift:  No intake/output data recorded.       Constitutional :  intubated and sedated  Gastrointestinal: abdomen less distended, with large output of maroon stool noted on ostomy, mucosa looking dusky with some discoloration from old hematoma formation surrounding ostomy.  foley less sanguinous today., JP with serosanguinous discharge.   Skin: Cool and moist. More cyanotic and cooler touch LLE compared to right, edema remains stable, no palpable pulses.  Psychiatric: Normal affect, non-agitated, not confused       LABS:  CMP Latest Ref Rng & Units 01-11-2018 01/11/2018 01/22/2018  Glucose 70 - 99 mg/dL 782(N) 562(Z) 72  BUN 8 - 23 mg/dL 17 15 18   Creatinine 0.44 - 1.00 mg/dL 3.08 6.57 8.46  Sodium 135 - 145 mmol/L 140 141 140  Potassium 3.5 - 5.1 mmol/L 4.1 5.0 4.8  Chloride 98 - 111 mmol/L 108 108 101  CO2 22 - 32 mmol/L 25 23 25   Calcium 8.9 - 10.3 mg/dL 9.6(E) 6.7(L) 7.5(L)  Total Protein 6.5 - 8.1 g/dL 9.5(M) 8.4(X) -  Total Bilirubin 0.3 - 1.2 mg/dL 3.2(G) 2.4(H) -  Alkaline Phos 38 - 126 U/L 64 70 -  AST 15 - 41 U/L 31 32 -  ALT 0 - 44 U/L 17 19 -   CBC Latest Ref Rng & Units 2018-01-11 01/18/2018 12/31/2017  WBC 4.0 - 10.5 K/uL 3.4(L) 10.9(H) 10.5  Hemoglobin 12.0 -  15.0 g/dL 15.7(H) 14.7 14.9  Hematocrit 36.0 - 46.0 % 45.6 43.6 43.0  Platelets 150 - 400 K/uL 156 216 196    RADS: n/a Assessment:  S/p ex lap, sigmoid colectomy, primary bladder repair, ostomy placement  Critically ill.  Explained to detail to daughter, son-in-law, grand daughter at bedside hospital course and discussion I had with patient regarding r/b/a to the surgery.  Answered all their questions at the time.  I did mention that if her ostomy fails due to all the pressors, she is too unstable for a re-operation.  Recommended continuing full medical support per ICU team at this time.

## 2018-01-29 NOTE — Consult Note (Signed)
Pharmacy Antibiotic Note  Yvette Martinez is a 67 y.o. female admitted on 2018/01/24 with sepsis with unknown source.  Patient presented with bilateral lower extremity edema and bowel obstruction. Underwent IVC placement 11/3 and laparotomy 11/4. Patient has nicked bladder; is having blood in urine. Pharmacy has been consulted for Meropenem + Vancomycin dosing.  Plan: Ordered Meropenem 1 g q8h and Vancomycin 1250 mg Q24 hours per CCM discussion. Patient received Vancomycin 1 g x 1. Will check vancomycin trough before 4th dose on 11/6 @ 1830. Goal trough 15-20.  Vancomycin Kinetics Actual BW 62.5 kg; CrCl 57.7 mL/min ke 0.0852 Vd 43.6 L T1/2 13.3 hours    Height: 5\' 2"  (157.5 cm) Weight: 137 lb 12.6 oz (62.5 kg) IBW/kg (Calculated) : 50.1  Temp (24hrs), Avg:97.9 F (36.6 C), Min:97 F (36.1 C), Max:99.1 F (37.3 C)  Recent Labs  Lab 01-24-18 2000 January 24, 2018 2036 12/31/17 0451 01/07/2018 0605 01/21/2018 0050 01/07/2018 0409  WBC 12.9*  --  10.5 10.9*  --  3.4*  CREATININE  --  0.84 0.80 0.65 0.50 0.54  LATICACIDVEN  --   --   --   --   --  2.5*    Estimated Creatinine Clearance: 57.7 mL/min (by C-G formula based on SCr of 0.54 mg/dL).    Allergies  Allergen Reactions  . Penicillins Rash and Other (See Comments)    Has patient had a PCN reaction causing immediate rash, facial/tongue/throat swelling, SOB or lightheadedness with hypotension: Unknown Has patient had a PCN reaction causing severe rash involving mucus membranes or skin necrosis: Unknown Has patient had a PCN reaction that required hospitalization: No Has patient had a PCN reaction occurring within the last 10 years: No If all of the above answers are "NO", then may proceed with Cephalosporin use.   . Venlafaxine Swelling  . Sulfa Antibiotics Hives    Antimicrobials this admission: 11/4 Meropenem >> 11/4 Vancomycin >> 11/3 Flagyl x 1 11/3 Ciprofloxacin x 1 11/3 Clindamycin x 1  Dose adjustments this  admission: N/A  Microbiology results:  11/3 MRSA PCR: (-)  Thank you for allowing pharmacy to be a part of this patient's care.   Mauri Reading, PharmD Pharmacy Resident  01/22/2018 4:36 PM

## 2018-01-29 NOTE — Consult Note (Addendum)
PULMONARY / CRITICAL CARE MEDICINE  Name: Yvette Martinez MRN: [id number] DOB: 12-24-1950    LOS: 3  Referring Provider: Dr Tonna Boehringer  Reason for Referral:  Respiratory failure and septic shock Brief patient description: 67 year old female admitted with 4 to 6 weeks of constipation, nausea, vomiting, abdominal distention and lower extremity edema.  She was found to have a bowel obstruction and was taken for an exploratory laparotomy, lysis of adhesions, abdominal washout, and a left colon resection with colostomy.  Postoperatively patient developed left leg ischemia.  CTA general showed an occlusion of the left iliac artery.  Patient seen by vascular.  No intervention recommended given that patient has a very unstable.  She is on pressors and full vent support  HPI: This is a 67 year old female with department's medical history as indicated below who presented to the ED with complaints of 4 to 6 weeks of constipation, nausea, vomiting, poor appetite and lower extremity swelling.  Her ED work-up revealed a bowel obstruction.  Surgery was consulted and patient was taken for an exploratory laparotomy on 12-Jan-2018.  He underwent a Hartman's procedure, abdominal washout, and leases of adhesions.  Postoperatively, patient developed acute leg ischemia due to external iliac artery occlusion as well as septic shock requiring pressors.  She has been seen by vascular surgery and no intervention recommended at this time given patient's unstable status.  Her blood loss in the ED was approximately 750 cc and patient was given 3 L of IV fluids.  Her mean arterial blood pressures remain between 60 and 65 mmHg.  She is currently intubated and sedated hence unable to provide any history. Per records, she has a history of left lower extremity DVT was on anticoagulation for some time.  An ultrasound of the lower extremities revealed a bilateral DVT with partial thrombosis occlusion of the proximal right femoral vein and the  left popliteal vein.  Past Medical History:  Diagnosis Date  . COPD (chronic obstructive pulmonary disease) (HCC)   . Diabetes mellitus without complication (HCC)   . Diverticula of colon   . DVT (deep vein thrombosis) in pregnancy   . Pneumonia    Past Surgical History:  Procedure Laterality Date  . ABDOMINAL HYSTERECTOMY    . CYSTECTOMY     patient states beind her bladder  . TONSILLECTOMY     Prior to Admission medications   Medication Sig Start Date End Date Taking? Authorizing Provider  amLODipine (NORVASC) 5 MG tablet Take 5 mg by mouth daily.   Yes [provider]  clopidogrel (PLAVIX) 75 MG tablet Take 75 mg by mouth daily.   Yes [provider]  donepezil (ARICEPT) 5 MG tablet Take 1 tablet (5 mg total) by mouth at bedtime. 10/24/17 12/03/17 Yes Sowles, Danna Hefty, MD  empagliflozin (JARDIANCE) 25 MG TABS tablet Take 25 mg by mouth daily.   Yes [provider]  glycopyrrolate (ROBINUL) 1 MG tablet Take 1 mg by mouth 2 (two) times daily.   Yes [provider]  insulin aspart (NOVOLOG FLEXPEN) 100 UNIT/ML FlexPen Inject 12 Units into the skin 2 (two) times daily.   Yes [provider]  insulin aspart (NOVOLOG) 100 UNIT/ML FlexPen Inject 18 Units into the skin daily. At 1700   Yes [provider]  Insulin Degludec-Liraglutide (XULTOPHY) 100-3.6 UNIT-MG/ML SOPN Inject 50 Units into the skin daily.   Yes [provider]  levETIRAcetam (KEPPRA) 500 MG tablet Take 500 mg by mouth 2 (two) times daily.   Yes [provider]  lipase/protease/amylase (CREON) 12000 units CPEP capsule Take 6,000 Units by mouth 3 (three) times daily before meals.   Yes [provider]  lipase/protease/amylase (CREON) 12000 units CPEP capsule Take 3,000 Units by mouth at bedtime. With snack   Yes [provider]  lisinopril (PRINIVIL,ZESTRIL) 5 MG tablet Take 5 mg by mouth daily.   Yes [provider]  metoprolol  succinate (TOPROL-XL) 25 MG 24 hr tablet Take 1 tablet (25 mg total) by mouth daily. 10/24/17  Yes Sowles, Danna Hefty, MD  rosuvastatin (CRESTOR) 40 MG tablet Take 1 tablet (40 mg total) by mouth daily. 10/24/17 12/03/17 Yes Alba Cory, MD  aspirin EC 81 MG tablet Take 81 mg by mouth daily.    [provider]  famotidine (PEPCID) 20 MG tablet Take 1 tablet (20 mg total) by mouth 2 (two) times daily. 10/24/17 11/23/17  Alba Cory, MD  gabapentin (NEURONTIN) 300 MG capsule Take 1 capsule (300 mg total) by mouth 2 (two) times daily. 10/24/17 11/23/17  Alba Cory, MD  insulin glargine (LANTUS) 100 UNIT/ML injection Inject 0.1 mLs (10 Units total) into the skin daily. 10/24/17 11/23/17  Alba Cory, MD  lacosamide 100 MG TABS Take 1 tablet (100 mg total) by mouth 2 (two) times daily. Patient not taking: Reported on 12/03/2017 03/11/17   Enedina Finner, MD  promethazine (PHENERGAN) 12.5 MG tablet Take 1 tablet (12.5 mg total) by mouth every 6 (six) hours as needed for nausea or vomiting. Patient not taking: Reported on 12/03/2017 12/28/16   Almond Lint, MD  sertraline (ZOLOFT) 25 MG tablet Take 1 tablet (25 mg total) by mouth daily. Patient not taking: Reported on 12/03/2017 10/24/17   Alba Cory, MD   Allergies Allergies  Allergen Reactions  . Penicillins Rash and Other (See Comments)    Has patient had a PCN reaction causing immediate rash, facial/tongue/throat swelling, SOB or lightheadedness with hypotension: Unknown Has patient had a PCN reaction causing severe rash involving mucus membranes or skin necrosis: Unknown Has patient had a PCN reaction that required hospitalization: No Has patient had a PCN reaction occurring within the last 10 years: No If all of the above answers are "NO", then may proceed with Cephalosporin use.   . Venlafaxine Swelling  . Sulfa Antibiotics Hives    Family History Family History  Problem Relation Age of Onset  . CAD Father   . Aneurysm  Father   . Diabetes Brother   . Cancer Sister   . Kidney cancer Neg Hx   . Bladder Cancer Neg Hx    Social History  reports that she has been smoking cigarettes. She has been smoking about 0.30 packs per day. She has never used smokeless tobacco. She reports that she does not drink alcohol or use drugs.  Review Of Systems: Unable to obtain as patient is currently intubated and sedated  VITAL SIGNS: BP (!) 87/62   Pulse (!) 102   Temp (!) 97 F (36.1 C)   Resp 16   Ht 5\' 2"  (1.575 m)   Wt 62.5 kg   SpO2 97%   BMI 25.20 kg/m   HEMODYNAMICS:    VENTILATOR SETTINGS: Vent Mode: PRVC FiO2 (%):  [40 %] 40 % Set Rate:  [12 bmp-16 bmp] 16 bmp Vt Set:  [450 mL] 450 mL PEEP:  [5 cmH20] 5 cmH20 Plateau Pressure:  [14 cmH20-17 cmH20] 14 cmH20  INTAKE / OUTPUT: I/O last 3 completed shifts: In: 3671.2 [I.V.:2723.5; IV Piggyback:947.7] Out: 240 [Emesis/NG output:240]  PHYSICAL EXAMINATION: General: Acutely ill looking HEENT: PERRLA, trachea midline, no JVD Neuro: Unresponsive to noxious stimuli, corneal reflexes intact Cardiovascular: Apical pulse regular, S1-S2, no murmur regurg or gallop, +2 pulses in bilateral upper extremities, bilateral lower extremities cool left greater than right and no palpable pulses Lungs: Bilateral breath sounds, diminished in the bases, no wheezing Abdomen: Extensive abdominal incision with dressing intact, JP drain with serosanguineous drainage, Foley catheter with hyper concentrated urine, colostomy bag with liquid stool Musculoskeletal: No joint deformities Skin: Warm and dry  LABS:  BMET Recent Labs  Lab 12/31/17 0451 12/31/17 1302 01/06/2018 0605 01/26/2018 0050  NA 138  --  140 141  K <2.0* 2.1* 4.8 5.0  CL 88*  --  101 108  CO2 36*  --  25 23  BUN 17  --  18 15  CREATININE 0.80  --  0.65 0.50  GLUCOSE 83  --  72 156*    Electrolytes Recent Labs  Lab 01-25-18 2036 12/31/17 0451 12/31/17 1302 01/24/2018 0605 01/09/2018 0050   CALCIUM 7.7* 7.4*  --  7.5* 6.7*  MG 1.5*  --  2.7*  --  1.6*  PHOS  --   --   --   --  3.2    CBC Recent Labs  Lab 25-Jan-2018 2000 12/31/17 0451 01/26/2018 0605  WBC 12.9* 10.5 10.9*  HGB 15.1* 14.9 14.7  HCT 43.1 43.0 43.6  PLT 225 196 216    Coag's Recent Labs  Lab 12/31/17 1431  APTT 26  INR 1.10    Sepsis Markers No results for input(s): LATICACIDVEN, PROCALCITON, O2SATVEN in the last 168 hours.  ABG Recent Labs  Lab 01/13/2018 2302  PHART 7.28*  PCO2ART 48  PO2ART 89    Liver Enzymes Recent Labs  Lab 01-25-2018 2036 01/18/2018 0050  AST 40 32  ALT 28 19  ALKPHOS 116 70  BILITOT 1.6* 2.4*  ALBUMIN 2.8* 2.2*    Cardiac Enzymes Recent Labs  Lab 01/25/18 2036  TROPONINI 0.04*    Glucose Recent Labs  Lab 01/04/2018 2239  GLUCAP 144*    Imaging Dg Abd 1 View  Result Date: 01/16/2018 CLINICAL DATA:  Bowel obstruction.  Abdominal pain and discomfort. EXAM: ABDOMEN - 1 VIEW COMPARISON:  CT of the abdomen and pelvis Jan 25, 2018 FINDINGS: Marked dilation of the colon is again noted. There is no significant interval change. A J-tube is coiled in the stomach. No free air is present. IMPRESSION: Stable distal colonic obstruction. Electronically Signed   By: Marin Roberts M.D.   On: 01/07/2018 10:55   Ct Angio Ao+bifem W & Or Wo Contrast  Result Date: 01/11/2018 CLINICAL DATA:  Left leg cold and painful with sudden onset. EXAM: CT ANGIOGRAPHY OF ABDOMINAL AORTA WITH ILIOFEMORAL RUNOFF TECHNIQUE: Multidetector CT imaging of the abdomen, pelvis and lower extremities was performed using the standard protocol during bolus administration of intravenous contrast. Multiplanar CT image reconstructions and MIPs were obtained to evaluate the vascular anatomy. CONTRAST:  ISOVUE-370 IOPAMIDOL (ISOVUE-370) INJECTION 76% COMPARISON:  CT abdomen and pelvis Jan 25, 2018 FINDINGS: VASCULAR Aorta: Normal caliber. No aneurysm, dissection, or occlusion. Aortic calcifications.  Celiac: Celiac axis is somewhat diminutive with calcification at the origin. Appears patent. Likely proximal stenosis. SMA: Patent without evidence of aneurysm, dissection, vasculitis or significant stenosis. Renals: Both renal arteries are patent without evidence of aneurysm, dissection, vasculitis, fibromuscular dysplasia or significant stenosis. IMA: Patent without evidence of aneurysm, dissection, vasculitis or significant stenosis. RIGHT Lower Extremity Inflow: Common, internal, and external  iliac arteries are patent. No aneurysm or occlusion. Outflow: Common, superficial and profunda femoral arteries and the popliteal artery are patent without evidence of aneurysm, dissection, vasculitis or significant stenosis. Runoff: Patent three vessel runoff to the ankle. LEFT Lower Extremity Inflow: Left common iliac artery is patent. There is occlusion or thrombosis of the left external iliac artery just past its origin. No flow is demonstrated throughout most of the left external iliac artery. There is reconstitution at the origin of the left common femoral artery. Outflow: Common, superficial and profunda femoral arteries and the popliteal artery are patent without evidence of aneurysm, dissection, vasculitis or significant stenosis. Runoff: Minimal flow demonstrated in the popliteal artery, and tibial trunk. No flow demonstrated in the trifurcation vessels. This is likely due to decreased inflow. Veins: Inferior vena caval filter. No obvious venous abnormality within the limitations of this arterial phase study. Review of the MIP images confirms the above findings. NON-VASCULAR Lower chest: Atelectasis in the lung bases. Minimal bilateral pleural effusions. Hepatobiliary: Incomplete visualization of the liver due to field of view. No definite focal lesions. Gallbladder and bile ducts are poorly visualized. Pancreas: Appears grossly unremarkable. Peripancreatic edema is likely related to a more diffuse process rather  than pancreatitis. Spleen: Normal in size without focal abnormality. Adrenals/Urinary Tract: Adrenal glands are unremarkable. Kidneys are normal, without renal calculi, focal lesion, or hydronephrosis. Bladder is decompressed with a Foley catheter in place. Gas in the bladder consistent with catheter insertion. Residual contrast material in the bladder likely arising from a previous contrast administration as no contrast is currently demonstrated in the renal collecting system or ureters . Stomach/Bowel: Enteric tube tip in the body of the stomach. Fluid-filled stomach without significant wall thickening. Small bowel and colon are mainly decompressed. There is evidence of distal small bowel wall thickening without pneumatosis. Diffuse colonic wall thickening with pericolonic edema and fold thickening. Left lower quadrant descending colostomy. Lymphatic: Scattered lymph nodes are not pathologically enlarged. Reproductive: Status post hysterectomy. No adnexal masses. Other: Free air and free fluid in the abdomen, likely postoperative. Soft tissue gas is present in the anterior abdominal wall, also likely postoperative. Surgical drain in the pelvis. Skin clips along the anterior abdominal wall. Bilateral lower extremity edema. Musculoskeletal: No destructive bone lesions. 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 Minimal flow demonstrated in the left popliteal artery and runoff vessels likely due to decreased inflow. NON-VASCULAR 1. Diffuse colonic wall thickening with pericolonic edema and fold thickening and wall thickening of the distal small bowel consistent with enterocolitis. Free fluid and free air in the abdomen and pelvis, likely postoperative. 2. Atelectasis in the lung bases. Minimal bilateral pleural effusions. These results were called by telephone at the time of interpretation on 01/25/2018 at 2:32  am to ICU nurse [facility] , who verbally acknowledged these results. Electronically Signed   By: Burman Nieves M.D.   On: 01/27/2018 02:36   Portable Chest X-ray  Result Date: 2018/01/22 CLINICAL DATA:  History of COPD and DVT.  ETT. EXAM: PORTABLE CHEST 1 VIEW COMPARISON:  None. FINDINGS: The ETT is in good position. The NG tube terminates below today's film. The heart, hila, mediastinum, lungs, and pleura are otherwise unchanged and unremarkable. IMPRESSION: Support apparatus as above.  No acute abnormalities. Electronically Signed   By: Gerome Sam III M.D   On: 2018/01/22 23:43    STUDIES:  None  CULTURES: None  ANTIBIOTICS:  Flagyl Ciprofloxacin and clindamycin for surgical prophylaxis  SIGNIFICANT EVENTS: 11/29/2017: Admitted with severe constipation and bowel obstruction 12/31/2017: IVC filter placement 12/01/2017: Taken to OR for exploratory laparotomy and colostomy 12/02/2017: Found to have left leg ischemia secondary to iliac artery occlusion  LINES/TUBES: Peripheral IVs Foley catheter ET tube Left IJ CVL  DISCUSSION: 67 year old female presenting with septic shock secondary to exploratory laparotomy,  colostomy, colon resection, and left lower extremity ischemia  ASSESSMENT Acute respiratory failure with hypoxia and hypercarbia Bilateral DVTs Left leg ischemia Bowel obstruction status post laparotomy, resection and colostomy Type 2 diabetes COPD  Assessment Full vent support with current settings ABG and chest x-ray PRN Follow-up chest x-ray Antibiotics as above Vascular and general surgery following IV fluids Pressors to maintain mean arterial blood pressure greater than 65 Trend CBC and transfuse as needed Bilateral DVT status post IVC filter placement Hemodynamic monitoring per ICU protocol Blood glucose monitoring with sliding scale insulin  Best Practice: Code Status: Full code Diet: N.p.o. GI prophylaxis: Pepcid VTE prophylaxis: SCDs,  nonpharmacologic DVT prophylaxis  FAMILY  - Updates: Sister updated by phone  [facility] ANP-BC Pulmonary and Critical Care Medicine [facility] Pager [phone] or [phone]  NB: This document was prepared using Dragon voice recognition software and may include unintentional dictation errors.    01/11/2018, 4:25 AM   Pulmonary/critical care attending  I have personally seen and examined Yvette Martinez, reviewed revised and confirmed nurse practitioner's note.  I independently performed history, physical, reviewed all laboratory and imaging studies  Patient is a 67 year old female presented to the ED with complaints of 4 to 6 weeks of constipation, nausea, vomiting, poor appetite and lower extremity swelling.  Her ED work-up revealed a bowel obstruction.  Surgery was consulted and patient was taken for an exploratory laparotomy on 01/18/2018.  He underwent a Hartman's procedure, abdominal washout, and leases of adhesions.  Postoperatively, patient developed acute leg ischemia due to external iliac artery occlusion as well as septic shock requiring pressors.  She has been seen by vascular surgery and no intervention recommended at this time given patient's unstable status.  Her blood loss in the ED was approximately 750 cc and patient was given 3 L of IV fluids.  Her mean arterial blood pressures remain between 60 and 65 mmHg.  She is currently intubated and sedated hence unable to provide any history. Per records, she has a history of left lower extremity DVT was on anticoagulation for some time.  An ultrasound of the lower extremities revealed a bilateral DVT with partial thrombosis occlusion of the proximal right femoral vein and the left popliteal vein.  Patient is presently intubated, on mechanical ventilation, hypotensive on norepinephrine.  Norepinephrine requirements increasing, may need to add additional pressors.  Patient is empirically on Cipro.  Supportive  care  Tora Kindred, DO

## 2018-01-29 NOTE — Op Note (Signed)
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   Anesthesia: GETA  Surgeon: Sung Amabile, DO Assistant: Byrnett  Wound Classification: Dirty  Indications:  Patient is a 67 y.o. female with colon obstruction, likely colonic mass.  Despite conservative management, her clinical exam worsened with increased distention during her hospital stay.  Due to worsening exam that may lead to acute perforation, extensive discussion between patient and myself regarding r/b/a to urgent surgical intervention was discussed.  I recommended surgery and she was in agreement.  Description of procedure:  The patient was placed in the supine position and general endotracheal anesthesia was induced. A time-out was completed verifying correct patient, procedure, site, positioning, and implant(s) and/or special equipment prior to beginning this procedure. Preoperative antibiotics were given. Foley placed.  The abdomen was prepped and draped in the usual sterile fashion. A vertical midline incision was made from superior to umbilicus to pubic tubercle. This was deepened through the subcutaneous tissues and hemostasis was achieved with electrocautery. The linea alba was identified and incised and the peritoneal cavity entered. The abdomen was explored.   Severely dilated small bowel retracted to the right using a moist towel and self-retaining retractor. A very firm distal sigmoid mass was palpable just distal to the exteremly dilated proximal sigmoid, equal in depth at the pelvic brim, with extensive adhesions to the lateral and anterior abdominal wall cavity.  Using electrocautery, ligasure, blunt and sharp dissection, meticulous and careful dissection was attempted to separate the mass and sigmoid from surrounding structures.  Despite the  utmost care, the retroperitoneum had to be violated due to the adhesions and extensive bleeding was encountered due to an injury to the mesenteric vessels during this portion.  Hemostasis was achieved with manual pressure while Dr. Lemar Livings was called in for assistance.  Once Dr. Lemar Livings arrived, hemostasis was achieved with better visualization of the vessel injury and controlled with 3-0 silk suture ligation.  Once bleeding was controlled, dissection was continued.  Attempt at separation from the anterior  Sigmoid colon area resulted in a roughly 5cm full thickness injury to the dome of the bladder.  This was repaired in a two layer fashion using 3-0 chromic for the mucosal layer in a continuous fashion and then outer layer closed with 3-0 vicryl in a running continuous fashion.   Dissection then carried out in the lateral aspect where some bleeding was noted during the dissection.  This was well controlled with packing until the diseased sigmoid was transected at normal thickness rectum and dilated but normal thickness proximal descending colon.  The segmented portion attachment on the lateral aspect was freed with out any issue by taking down the splenic flexure.  The medial mesentary was ligated with ligasure without any issue. The left colon and sigmoid was then removed off operative field pending pathology.  The small bowel was milked back to the stomach to decompress.  A small hole was made in the future colostomy end and excess air and feces was suctioned out to decompress the proximal colon in preparation for closure and ostomy creation.  This process significantly reduced the caliber of bowel, allowing easier and safer closure of wound and ostomy creation.     The packed are in the left retroperitoneum was inspected.  There was a greater than half circumfrence tangential injury to the external iliac vein over the visible psoas muscle.  Due  to the extent of injury, it was decided to suture ligate the  vein to prevent future bleeding.  Hemostasis confirmed after placement of 3-0 silk.  15mm Harrison Mons was placed in the pelvis region and secured to the skin with 3-0 nylon.    The proximal colon reached easily to the proposed colostomy site on left abdominal wall without tension. A disk of skin was removed from the colostomy site and the incision was deepened through all layers of the abdominal wall and dilated to admit two fingers. The colon was passed out through the ostomy site without torsion or tension.     A clean closure protocol initiated and the midline incision fascia was closed with minimal tension using running suture of PDS 0. The skin was closed with skin staples and dressed with wafer dressing.  The colostomy was matured with multiple interrupted sutures of 3-0 Vicryl. An ostomy bag was applied.  The patient tolerated the procedure well, extubated, and transferred to PACU in stable condition.  Sponge count and instrument count all correct at end of procedure.  Patient transferred to ICE in guarded condiiton.  Specimen: left colon  Complications: None apparent  EBL: 150 mL

## 2018-01-29 NOTE — Progress Notes (Signed)
On arrival to shift, pt found to be maxed out on 4 vasopressors, with still decreasing BP. Family in talks with offgoing nurse and chaplin about comfort care and pts possible wishes. Sister and granddaughter at bedside for support of pt agreed on comfort care and called brother(pt son) who is POA. Upon speaking with writing RN, chaplin, and NP son and family decided and agreed upon transition to comfort care for pt. Pt transitioned, and all medications stopped except for fentanyl per order. NP at bedside during transition verbal to give fentanyl bolus, administered. Family brought back to bedside. Pt pronounced by 2 RNs at 2113/08/05. NP made aware. Emotional support given by RN and chaplin.  Pt belongings signed out from security by son. Pt home meds will be properly disposed of by pharmacy. Son made aware. Daughter took purse and personal belongings from room on departure.

## 2018-01-29 NOTE — Progress Notes (Signed)
Spoke with MD Tonna Boehringer and made him aware that patients JP drain had an output of 650cc since 0700, pink colored fluid. New ostomy has a black appearance, possible dried blood, MD to assess when he rounds on patient. Also, asked MD if Heparin needed to be restarted for thrombus in left lower leg, MD stated no Heparin for next 24 hours. No new orders at this time. Will continue to monitor patient.

## 2018-01-29 NOTE — Progress Notes (Signed)
Subjective: Interval History: Unavailable from patient as she is intubated and sedated.  This has been gleaned from the chart and with speaking with Dr. Tonna Boehringer.  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  This was resected with Hartman's procedure being performed, but unfortunately due to the severity of adhesive nature had bleeding in the retroperitoneum, obvious injury to the external iliac vein, which was suture-ligated.  Also had bladder adherence requiring repair.  Postprocedurally, was noted to have disparate lower extremity perfusion.  Was intubated and sedated, unclear if significant pain present.  Was significantly hypothermic, warming blanket currently is in place.  I was contacted by surgery with regard to what specific evaluation to undertake and CT angiogram was recommended.  Results were called to nursing in ICU at roughly 230 and I was contacted by Rebbeca Paul, nurse practitioner.  CT scan shows cut off of external iliac flow beyond the left iliac bifurcation, and reconstitution of external iliac common femoral junction with flow present in the femoral bifurcation, superficial femoral and popliteal arteries.  Cannot see dye specifically in the tibial distribution.  This is deemed likely due to low inflow.  I am asked to see her in this context.  Central line is being placed for pressor support.  By report from nursing, blood loss in the OR was 750 cc, 3 L IV fluid given   Objective: Vital signs in last 24 hours: Temp:  [97 F (36.1 C)-98.2 F (36.8 C)] 97 F (36.1 C) (11/04 0000) Pulse Rate:  [87-108] 102 (11/04 0200) Resp:  [12-20] 16 (11/04 0200) BP: (82-132)/(58-83) 87/62 (11/04 0200) SpO2:  [94 %-100 %] 97 % (11/04 0222) FiO2 (%):  [40 %] 40 % (11/04 0222) Weight:  [62.5 kg] 62.5 kg (11/03 2245)  Intake/Output from previous day: 11/03 0701 - 11/04 0700 In: 5488.7 [I.V.:4374; Blood:500; IV Piggyback:614.7] Out: 2715  [Emesis/NG output:190; Drains:625; Stool:1200; Blood:700] Intake/Output this shift: Total I/O In: 4413.2 [I.V.:3648.5; Blood:500; IV Piggyback:264.7] Out: 2525 [Drains:625; Stool:1200; Blood:700]  General appearance: Intubated and sedated Pulses:  Weak right femoral pulse, signal on the left, signals in dorsalis pedis position dominant on the right, no obtainable pedal signal on the left Incision/Wound: JP drain in place, colostomy left lower quadrant, left lower extremity without obvious swelling despite venous ligation at the iliac level  Lab Results: Recent Labs    12/31/17 0451 2018-01-24 0605  WBC 10.5 10.9*  HGB 14.9 14.7  HCT 43.0 43.6  PLT 196 216   BMET Recent Labs    01/24/2018 0605 01/04/2018 0050  NA 140 141  K 4.8 5.0  CL 101 108  CO2 25 23  GLUCOSE 72 156*  BUN 18 15  CREATININE 0.65 0.50  CALCIUM 7.5* 6.7*    Studies/Results: Dg Chest 2 View  Result Date: 01/26/2018 CLINICAL DATA:  Bilateral lower extremity edema and abdominal distension. EXAM: CHEST - 2 VIEW COMPARISON:  Chest CT 06/08/2017 FINDINGS: Cardiomediastinal silhouette is normal. Mediastinal contours appear intact. Calcific atherosclerotic disease of the aorta. There is no evidence of focal airspace consolidation, pleural effusion or pneumothorax. Hyperinflation of the lungs. Mild bronchiectasis and peribronchial thickening, similar to the prior study. Osseous structures are without acute abnormality. Soft tissues are grossly normal. IMPRESSION: Mild bronchiectasis and peribronchial thickening, similar to the prior study. Hyperinflation of the lungs. No evidence of acute lobar airspace consolidation or pulmonary edema. Electronically Signed   By: Ted Mcalpine M.D.   On: 01/06/2018 20:37   Dg  Abd 1 View  Result Date: 01/03/2018 CLINICAL DATA:  Bowel obstruction.  Abdominal pain and discomfort. EXAM: ABDOMEN - 1 VIEW COMPARISON:  CT of the abdomen and pelvis January 17, 2018 FINDINGS: Marked dilation of  the colon is again noted. There is no significant interval change. A J-tube is coiled in the stomach. No free air is present. IMPRESSION: Stable distal colonic obstruction. Electronically Signed   By: Marin Roberts M.D.   On: 01/27/2018 10:55   Ct Angio Chest Pe W Or Wo Contrast  Result Date: 12/31/2017 CLINICAL DATA:  History of deep venous thrombosis and difficulty breathing. EXAM: CT ANGIOGRAPHY CHEST WITH CONTRAST TECHNIQUE: Multidetector CT imaging of the chest was performed using the standard protocol during bolus administration of intravenous contrast. Multiplanar CT image reconstructions and MIPs were obtained to evaluate the vascular anatomy. CONTRAST:  75mL ISOVUE-370 IOPAMIDOL (ISOVUE-370) INJECTION 76% COMPARISON:  None. FINDINGS: Cardiovascular: Thoracic aorta is within normal limits without aneurysmal dilatation or dissection. No cardiac enlargement is seen. Mild coronary calcifications are noted. Pulmonary artery is well visualized within normal branching pattern. Few small filling defects are noted within the left upper lobe pulmonary arterial branches consistent with pulmonary emboli. No right heart strain is seen. No significant central emboli are seen. Mediastinum/Nodes: Nasogastric catheter is identified extending into the stomach are present. The esophagus is within normal limits. No hilar or mediastinal adenopathy is noted. The thoracic inlet is unremarkable. Lungs/Pleura: Lungs are aerated bilaterally without evidence of focal infiltrate or sizable effusion. Mild mucous plugging is noted in the right lower lobe without significant consolidation or collapse. No sizable parenchymal nodules are noted. Upper Abdomen: Visualized upper abdomen demonstrates significant fecal material throughout the colon. This is similar to that seen on prior CT examination. Musculoskeletal: Degenerative changes of the thoracic spine are noted. Review of the MIP images confirms the above findings.  IMPRESSION: Small pulmonary emboli within the left upper lobe pulmonary arterial branches without right heart strain. Persistent changes of constipation similar to that seen on recent CT of the abdomen. Mild mucous plugging without significant consolidation. These results will be called to the ordering clinician or representative by the Radiologist Assistant, and communication documented in the PACS or zVision Dashboard. Electronically Signed   By: Alcide Clever M.D.   On: 12/31/2017 15:37   Ct Abdomen Pelvis W Contrast  Result Date: 17-Jan-2018 CLINICAL DATA:  67 year old female with abdominal pain. History of sigmoid diverticulitis. EXAM: CT ABDOMEN AND PELVIS WITH CONTRAST TECHNIQUE: Multidetector CT imaging of the abdomen and pelvis was performed using the standard protocol following bolus administration of intravenous contrast. CONTRAST:  75mL OMNIPAQUE IOHEXOL 300 MG/ML  SOLN COMPARISON:  Abdominal radiograph dated 17-Jan-2018 and CT dated 07/07/2016 FINDINGS: Lower chest: Right lung base scarring primarily involving the right middle lobe noted. The left lung base is clear. No intra-abdominal free air or free fluid. Hepatobiliary: No focal liver abnormality is seen. No gallstones, gallbladder wall thickening, or biliary dilatation. Pancreas: Unremarkable. No pancreatic ductal dilatation or surrounding inflammatory changes. Spleen: Normal in size without focal abnormality. Adrenals/Urinary Tract: Adrenal glands are unremarkable. Kidneys are normal, without renal calculi, focal lesion, or hydronephrosis. Bladder is unremarkable. Stomach/Bowel: There is diffuse severe distention of the colon with gas and semi-solid fecal matter. The transverse colon measure up to 9 cm in diameter. The distal sigmoid and rectum are collapsed. There is a transition zone in the sigmoid colon (series 2, image 64 and coronal series 5, image 45) in the area of prior diverticulitis. Findings likely represent  an obstruction secondary to  stricture or adhesion, related to prior inflammatory process, although a mass is not excluded. A pseudo-obstruction or adynamic ileus are other consideration. There is no evidence of colonic twisting. There is sigmoid diverticulosis without active inflammatory changes. There is mild air distention of the distal small bowel and terminal ileum. Mildly thickened appearance of the distal small bowel loops in the pelvis may be reactive or represent enteritis. No evidence of small-bowel obstruction. There is no pneumatosis at this time. The appendix is unremarkable. Vascular/Lymphatic: Advanced aortoiliac atherosclerotic disease. Apparent low attenuating content within the proximal right femoral vein (series 2, image 87) likely mixing artifact. O'clock is less likely. Duplex ultrasound may provide better evaluation if there is clinical concern for DVT. The SMV, splenic vein, and main portal vein are patent. No portal venous gas. There is no adenopathy. Reproductive: Hysterectomy. No pelvic mass. Other: There is midline vertical anterior pelvic wall incisional scar. Subcutaneous edema of the left thigh. Musculoskeletal: Osteopenia with degenerative changes of the spine. No acute osseous pathology. IMPRESSION: 1. Severe distention of the colon with transition at the sigmoid, possibly secondary to stricture and obstruction versus less likely a pseudo-obstruction or an ileus. Clinical correlation and surgical consult is advised. No pneumatosis or portal venous gas. 2. Mildly thickened appearance of the distal small bowel loops in the pelvis may be reactive or represent enteritis. No evidence of small-bowel obstruction. Normal appendix. 3. Artifact versus less likely a right femoral vein DVT. 4. Advanced aortoiliac atherosclerotic disease. Electronically Signed   By: Elgie Collard M.D.   On: 01-27-18 23:06   Ct Angio Ao+bifem W & Or Wo Contrast  Result Date: 01/17/2018 CLINICAL DATA:  Left leg cold and painful with  sudden onset. EXAM: CT ANGIOGRAPHY OF ABDOMINAL AORTA WITH ILIOFEMORAL RUNOFF TECHNIQUE: Multidetector CT imaging of the abdomen, pelvis and lower extremities was performed using the standard protocol during bolus administration of intravenous contrast. Multiplanar CT image reconstructions and MIPs were obtained to evaluate the vascular anatomy. CONTRAST:  ISOVUE-370 IOPAMIDOL (ISOVUE-370) INJECTION 76% COMPARISON:  CT abdomen and pelvis 27-Jan-2018 FINDINGS: VASCULAR Aorta: Normal caliber. No aneurysm, dissection, or occlusion. Aortic calcifications. Celiac: Celiac axis is somewhat diminutive with calcification at the origin. Appears patent. Likely proximal stenosis. SMA: Patent without evidence of aneurysm, dissection, vasculitis or significant stenosis. Renals: Both renal arteries are patent without evidence of aneurysm, dissection, vasculitis, fibromuscular dysplasia or significant stenosis. IMA: Patent without evidence of aneurysm, dissection, vasculitis or significant stenosis. RIGHT Lower Extremity Inflow: Common, internal, and external iliac arteries are patent. No aneurysm or occlusion. Outflow: Common, superficial and profunda femoral arteries and the popliteal artery are patent without evidence of aneurysm, dissection, vasculitis or significant stenosis. Runoff: Patent three vessel runoff to the ankle. LEFT Lower Extremity Inflow: Left common iliac artery is patent. There is occlusion or thrombosis of the left external iliac artery just past its origin. No flow is demonstrated throughout most of the left external iliac artery. There is reconstitution at the origin of the left common femoral artery. Outflow: Common, superficial and profunda femoral arteries and the popliteal artery are patent without evidence of aneurysm, dissection, vasculitis or significant stenosis. Runoff: Minimal flow demonstrated in the popliteal artery, and tibial trunk. No flow demonstrated in the trifurcation vessels. This is  likely due to decreased inflow. Veins: Inferior vena caval filter. No obvious venous abnormality within the limitations of this arterial phase study. Review of the MIP images confirms the above findings. NON-VASCULAR Lower chest: Atelectasis in  the lung bases. Minimal bilateral pleural effusions. Hepatobiliary: Incomplete visualization of the liver due to field of view. No definite focal lesions. Gallbladder and bile ducts are poorly visualized. Pancreas: Appears grossly unremarkable. Peripancreatic edema is likely related to a more diffuse process rather than pancreatitis. Spleen: Normal in size without focal abnormality. Adrenals/Urinary Tract: Adrenal glands are unremarkable. Kidneys are normal, without renal calculi, focal lesion, or hydronephrosis. Bladder is decompressed with a Foley catheter in place. Gas in the bladder consistent with catheter insertion. Residual contrast material in the bladder likely arising from a previous contrast administration as no contrast is currently demonstrated in the renal collecting system or ureters . Stomach/Bowel: Enteric tube tip in the body of the stomach. Fluid-filled stomach without significant wall thickening. Small bowel and colon are mainly decompressed. There is evidence of distal small bowel wall thickening without pneumatosis. Diffuse colonic wall thickening with pericolonic edema and fold thickening. Left lower quadrant descending colostomy. Lymphatic: Scattered lymph nodes are not pathologically enlarged. Reproductive: Status post hysterectomy. No adnexal masses. Other: Free air and free fluid in the abdomen, likely postoperative. Soft tissue gas is present in the anterior abdominal wall, also likely postoperative. Surgical drain in the pelvis. Skin clips along the anterior abdominal wall. Bilateral lower extremity edema. Musculoskeletal: No destructive bone lesions. 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 Minimal flow demonstrated in the left popliteal artery and runoff vessels likely due to decreased inflow. NON-VASCULAR 1. Diffuse colonic wall thickening with pericolonic edema and fold thickening and wall thickening of the distal small bowel consistent with enterocolitis. Free fluid and free air in the abdomen and pelvis, likely postoperative. 2. Atelectasis in the lung bases. Minimal bilateral pleural effusions. These results were called by telephone at the time of interpretation on 01/27/2018 at 2:32 am to ICU nurse [facility] , who verbally acknowledged these results. Electronically Signed   By: Burman Nieves M.D.   On: 01/24/2018 02:36   US Venous Img Lower Bilateral  Result Date: 12/31/2017 CLINICAL DATA:  Bilateral lower extremity swelling for 1 week EXAM: BILATERAL LOWER EXTREMITY VENOUS DUPLEX ULTRASOUND TECHNIQUE: Doppler venous assessment of the bilateral lower extremity deep venous system was performed, including characterization of spectral flow, compressibility, and phasicity. COMPARISON:  None. FINDINGS: Right lower extremity: There is nonocclusive thrombus in the proximal right femoral vein. It is partially occlusive. The common femoral vein, mid right femoral vein, distal right femoral vein, and right popliteal vein are compressible. Doppler analysis demonstrates respiratory phasicity. Augmentation was deferred. Left lower extremity: There is complete compressibility of the left common femoral and femoral veins. The left popliteal vein is partially occlusive and contains partially occlusive thrombus. Doppler analysis demonstrates respiratory phasicity. Augmentation was deferred. IMPRESSION: The study is positive for bilateral DVT. There is partially occlusive thrombus in the proximal right femoral vein and partially occlusive thrombus in the left popliteal vein. Electronically Signed   By: Jolaine Click M.D.   On: 12/31/2017 13:30    Portable Chest X-ray  Result Date: 01/21/2018 CLINICAL DATA:  History of COPD and DVT.  ETT. EXAM: PORTABLE CHEST 1 VIEW COMPARISON:  None. FINDINGS: The ETT is in good position. The NG tube terminates below today's film. The heart, hila, mediastinum, lungs, and pleura are otherwise unchanged and unremarkable. IMPRESSION: Support apparatus as above.  No acute abnormalities. Electronically Signed   By: Gerome Sam III M.D   On: 01/15/2018 23:43  Dg Abd 2 Views  Result Date: 01/27/2018 CLINICAL DATA:  Distended abdomen EXAM: ABDOMEN - 2 VIEW COMPARISON:  CT 07/07/2016 FINDINGS: Markedly distended colon diffusely. Air-fluid levels right colon. Small bowel nondilated. No free air. IMPRESSION: Marked colonic distention compatible with ileus. Electronically Signed   By: Marlan Palau M.D.   On: 01/09/2018 20:37   Anti-infectives: Anti-infectives (From admission, onward)   Start     Dose/Rate Route Frequency Ordered Stop   Jan 14, 2018 1613  metroNIDAZOLE (FLAGYL) IVPB 500 mg     500 mg 100 mL/hr over 60 Minutes Intravenous 60 min pre-op 2018/01/14 1613     01-14-2018 1612  ciprofloxacin (CIPRO) IVPB 400 mg     400 mg 200 mL/hr over 60 Minutes Intravenous 60 min pre-op 14-Jan-2018 1613     14-Jan-2018 1430  clindamycin (CLEOCIN) IVPB 300 mg     300 mg 100 mL/hr over 30 Minutes Intravenous  Once 14-Jan-2018 1422 2018/01/14 1514   Jan 14, 2018 1409  clindamycin (CLEOCIN) 300 MG/50ML IVPB    Note to Pharmacy:  Marval Regal   : cabinet override      14-Jan-2018 1409 01/21/2018 0214      Assessment/Plan: s/p Procedure(s): EXPLORATORY LAPAROTOMY (N/A) COLOSTOMY COLON RESECTION SIGMOID 2.  Left lower extremity arterial inflow compromise:  Intentional ligation of left external iliac vein with likely unintentional compromise and suture ligation of external iliac artery.  She is hypotensive, hypothermic.  She needs to be resuscitated and warmed and optimized prior to any potential procedure.  It is unlikely that a  percutaneous procedure would be successful given the history of suture ligation in this location, but if she does well from a recovery standpoint perhaps femoral-femoral bypass or another such inflow procedure for the left common femoral region can be accomplished when she is more stable. Central line is being placed by ICU staff, with initiation of pressor support.  Body temperature is markedly improved from initially post procedurally.  I have discussed the plan with Beth from nursing, Rebbeca Paul, nurse practitioner, and have touched base with Dr. Wyn Quaker as well given the circumstances and need for ongoing management.   LOS: 3 days   Katana Berthold A Shernell Saldierna 01/06/2018, 4:10 AM

## 2018-01-29 NOTE — Progress Notes (Signed)
Patient ID: Yvette Martinez, female   DOB: [birth year], 67 y.o.   MRN: [id number] Pulmonary/critical care attending  Patient status has deteriorated throughout the day. Presently on 4 pressors, started on stress dose hydrocortisone still with marginal mean arterial blood pressure.  Discussed with family how critically ill patient is an discussed DNR status with family.  They agree that if her heart were to stop to not resuscitate but to continue full aggressive care as we are doing.  Will place DNR order in chart  Tora Kindred, DO

## 2018-01-29 NOTE — Progress Notes (Signed)
[facility]  Daily Progress Note   Subjective  - 1 Day Post-Op  Patient remains intubated, sedated in the ICU on pressors.  Critically ill  Objective Vitals:   2018-01-09 0855 January 09, 2018 0900 01-09-2018 0915 2018-01-09 0930  BP:  (!) 73/58 91/66 91/66   Pulse:  (!) 118 (!) 115 (!) 113  Resp:      Temp:      TempSrc:      SpO2: 97% 97% 97% 97%  Weight:      Height:        Intake/Output Summary (Last 24 hours) at 01/09/18 0936 Last data filed at 01/09/2018 0931 Gross per 24 hour  Intake 8606.19 ml  Output 3637 ml  Net 4969.19 ml    PULM  coarse bilaterally CV  tachycardic VASC  both feet are fairly warm with decent capillary refill. No palpable pulses on either side.  Laboratory CBC    Component Value Date/Time   WBC 3.4 (L) 2018-01-09 0409   HGB 15.7 (H) 01-09-18 0409   HGB 15.8 02/11/2013 0029   HCT 45.6 Jan 09, 2018 0409   HCT 46.0 02/11/2013 0029   PLT 156 01/09/2018 0409   PLT 226 02/11/2013 0029    BMET    Component Value Date/Time   NA 140 09-Jan-2018 0409   NA 139 02/11/2013 0029   K 4.1 01/09/2018 0409   K 3.9 02/11/2013 0029   CL 108 01/09/2018 0409   CL 104 02/11/2013 0029   CO2 25 09-Jan-2018 0409   CO2 29 02/11/2013 0029   GLUCOSE 147 (H) 01/09/18 0409   GLUCOSE 94 02/11/2013 0029   BUN 17 09-Jan-2018 0409   BUN 15 02/11/2013 0029   CREATININE 0.54 01-09-18 0409   CREATININE 0.77 02/11/2013 0029   CALCIUM 6.5 (L) 01/09/2018 0409   CALCIUM 9.5 02/11/2013 0029   GFRNONAA >60 2018/01/09 0409   GFRNONAA >60 02/11/2013 0029   GFRAA >60 01-09-2018 0409   GFRAA >60 02/11/2013 0029    Assessment/Planning:    Filter in place prior to surgery and is in good position based on the CT scan.  No plans for removal anytime soon.  Left iliac occlusion.  Her foot is surprisingly warm and not immediately threatened.  I would question whether or not the mass had functionally occluded the left external iliac artery prior to surgery  and she had already built up a reasonable collaterals.  She is certainly in no state for a femoral to femoral bypass at this point, although this can be considered at some point in the future if she stabilizes and has significant pain in the left leg.  She remains on vasopressin and levo fed and needs significant resuscitation before any consideration for vascular surgery should be given.  She also had a grossly contaminated field, and the further we can get from that before considering any vascular reconstruction the less the chance of infection.  This is a very difficult and critical situation with a high risk of death.  No immediate appearance of limb loss, although she may be significantly symptomatic from the iliac occlusion once she recovers and limb loss is possible.    Festus Barren  01-09-2018, 9:36 AM

## 2018-01-29 NOTE — Progress Notes (Signed)
Nutrition Follow-up  DOCUMENTATION CODES:   Not applicable  INTERVENTION:  Patient is not appropriate for enteral nutrition at this time in setting of hemodynamic instability and recent surgery.  Will need to consider initiation of TPN by 11/8 if patient is still not appropriate for enteral nutrition by then.  NUTRITION DIAGNOSIS:   Inadequate oral intake related to inability to eat as evidenced by NPO status.  Ongoing.  GOAL:   Provide needs based on ASPEN/SCCM guidelines  Not met at this time.  MONITOR:   Vent status, Labs, Weight trends, Skin, I & O's  REASON FOR ASSESSMENT:   Malnutrition Screening Tool, Ventilator    ASSESSMENT:   67 year old female with PMHx of diverticulosis, COPD, hx DVT, DM who is admitted with sigmoid colon obstruction (mass vs stricture from previous diverticulitis).   -Patient s/p placement of IVC filter yesterday. 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 Patient was brought to ICU afterwards on ventilator. -Postoperatively patient had increased swelling in left leg and was found to have left lower extremity arterial inflow compromise.  Patient is intubated and sedated. On PRVC mode with FiO2 40% and PEEP 5 cmH2O.. Two family members present at bedside at time of RD assessment. They report patient has had a decreased appetite and intake for the past 4-6 weeks. She has had constipation, N/V, abdominal distention. PTA she was eating small snacks instead of full meals but they do not live with her so difficult to quantify how well she was eating. Per weight history in chart patient has fluctuated between 55.4-60.3 kg over the past few years. On 11/2 patient was measured at 52.9 kg (116.62 lbs) but today her weight is 62.5 kg (137.79 lbs). Highly unlikely that patient gained 10 kg overnight, even with fluid that has been provided. Will continue to  monitor weight trend. Patient has been NPO since her admission on 11/1 so she is currently day 3 of NPO status.  IV Access: left IJ CVC triple lumen placed 11/4; tip over cavoatrial junction per chest x-ray 11/4  Enteral Access: 16 Fr. NGT placed 11/2; terminates in stomach per abdominal x-ray 11/3; 60 cm at right nare; currently to LIS  MAP: 43-74 mmHg  Patient is currently intubated on ventilator support Ve: 6.1 L/min Temp (24hrs), Avg:97.6 F (36.4 C), Min:97 F (36.1 C), Max:98.1 F (36.7 C)  Propofol: N/A  Medications reviewed and include: Novolog 0-15 units Q4hrs, famotidine, fentanyl gtt, LR @ 125 mL/hr, meropenem, norepinephrine gtt at 40 mcg/min, phenylephrine gtt at 80 mcg/min, vancomycin, vasopressin gtt at 0.03 units/min.  Labs reviewed: CBG 105, Calcium 6.5 (corrects to 8.1 with Albumin of 2), Magnesium 1.6.  I/O: 100 mL UOP yesterday + 2 unmeasured outputs; 190 mL output from NGT yesterday; 705 mL output from right JP drain yesterday; 1200 mL output from colostomy yesterday  Patient is at risk for malnutrition but does not meet criteria for malnutrition at this time.  Discussed with RN and on rounds.  NUTRITION - FOCUSED PHYSICAL EXAM:    Most Recent Value  Orbital Region  No depletion  Upper Arm Region  No depletion  Thoracic and Lumbar Region  No depletion  Buccal Region  No depletion  Temple Region  Moderate depletion  Clavicle Bone Region  Mild depletion  Clavicle and Acromion Bone Region  Mild depletion  Scapular Bone Region  Unable to assess  Dorsal Hand  Mild depletion  Patellar Region  Unable  to assess  Anterior Thigh Region  Unable to assess  Posterior Calf Region  Unable to assess  Edema (RD Assessment)  Moderate  Hair  Reviewed  Eyes  Unable to assess  Mouth  Unable to assess  Skin  Reviewed  Nails  Reviewed     Diet Order:   Diet Order            Diet NPO time specified  Diet effective now              EDUCATION NEEDS:   No  education needs have been identified at this time  Skin:  Skin Assessment: Skin Integrity Issues:(closed incision to abdomen; ecchymosis to bilateral arms)  Last BM:  Jan 25, 2018 - medium type 7 in colostomy  Height:   Ht Readings from Last 1 Encounters:  12/31/17 5' 2"  (1.575 m)    Weight:   Wt Readings from Last 1 Encounters:  01/28/2018 62.5 kg    Ideal Body Weight:  50 kg  BMI:  Body mass index is 25.2 kg/m.  Estimated Nutritional Needs:   Kcal:  1170 (PSU 2003b w/ MSJ 1108, Ve 6.1, Tmax 36.7)  Protein:  80-95 grams (1.3-1.5 grams/kg)  Fluid:  1.5-1.8 L/day  Willey Blade, MS, RD, LDN Office: [phone] Pager: [phone] After Hours/Weekend Pager: [phone]

## 2018-01-29 NOTE — Anesthesia Postprocedure Evaluation (Signed)
Anesthesia Post Note  Patient: [name]  Procedure(s) Performed: EXPLORATORY LAPAROTOMY (N/A Abdomen) COLOSTOMY COLON RESECTION SIGMOID  Patient location during evaluation: ICU Anesthesia Type: General Level of consciousness: patient remains intubated per anesthesia plan and sedated Pain control: Pt on fentanyl gtt. Respiratory status: patient remains intubated per anesthesia plan and patient on ventilator - see flowsheet for VS Cardiovascular status: stable (Pt on levophed gtt) : Pt remains intubated and sedated. ICU MD note states continued stabilization of pt on vent and gtts. Anesthetic complications: no     Last Vitals:  Vitals:   01/04/2018 0715 01/28/2018 0745  BP: (!) 87/63   Pulse: (!) 115   Resp: (!) 40   Temp:  (P) 36.7 C  SpO2: 96%     Last Pain:  Vitals:   01/18/2018 0745  TempSrc: (P) Oral  PainSc:                  Yvette Martinez

## 2018-01-29 NOTE — Progress Notes (Addendum)
ANTICOAGULATION CONSULT NOTE - Initial Consult  Pharmacy Consult for heparin Indication: VTE treatment  Allergies  Allergen Reactions  . Penicillins Rash and Other (See Comments)    Has patient had a PCN reaction causing immediate rash, facial/tongue/throat swelling, SOB or lightheadedness with hypotension: Unknown Has patient had a PCN reaction causing severe rash involving mucus membranes or skin necrosis: Unknown Has patient had a PCN reaction that required hospitalization: No Has patient had a PCN reaction occurring within the last 10 years: No If all of the above answers are "NO", then may proceed with Cephalosporin use.   . Venlafaxine Swelling  . Sulfa Antibiotics Hives    Patient Measurements: Height: 5\' 2"  (157.5 cm) Weight: 137 lb 12.6 oz (62.5 kg) IBW/kg (Calculated) : 50.1 Heparin Dosing Weight: 52.9 kg  Vital Signs: Temp: 99.1 F (37.3 C) (11/04 1525) Temp Source: Oral (11/04 0745) BP: 89/71 (11/04 1500) Pulse Rate: 122 (11/04 1500)  Labs: Recent Labs    12/31/17 0451 12/31/17 1431 12/31/17 2049 01/10/2018 0605 28-Jan-2018 0050 01/28/2018 0409 28-Jan-2018 0858 Jan 28, 2018 1515  HGB 14.9  --   --  14.7  --  15.7*  --   --   HCT 43.0  --   --  43.6  --  45.6  --   --   PLT 196  --   --  216  --  156  --   --   APTT  --  26  --   --   --   --   --   --   LABPROT  --  14.1  --   --   --  15.6*  --   --   INR  --  1.10  --   --   --  1.25  --   --   HEPARINUNFRC  --   --  1.48* 1.54*  --  <0.10*  --   --   CREATININE 0.80  --   --  0.65 0.50 0.54  --   --   TROPONINI  --   --   --   --   --  <0.03 <0.03 0.05*    Estimated Creatinine Clearance: 57.7 mL/min (by C-G formula based on SCr of 0.54 mg/dL).   Medical History: Past Medical History:  Diagnosis Date  . COPD (chronic obstructive pulmonary disease) (HCC)   . Diabetes mellitus without complication (HCC)   . Diverticula of colon   . DVT (deep vein thrombosis) in pregnancy   . Pneumonia     Medications:   Infusions:  . ciprofloxacin    . epinephrine    . famotidine (PEPCID) IV 20 mg (01/28/2018 0951)  . fentaNYL infusion INTRAVENOUS 175 mcg/hr (01/28/18 1527)  . lactated ringers 1,000 mL (01/28/2018 1619)  . lactated ringers 125 mL/hr at January 28, 2018 1259  . meropenem (MERREM) IV    . metronidazole    . norepinephrine (LEVOPHED) Adult infusion 60 mcg/min (01-28-2018 1440)  . phenylephrine (NEO-SYNEPHRINE) Adult infusion 200 mcg/min (01-28-18 1505)  . vancomycin    . vasopressin (PITRESSIN) infusion - *FOR SHOCK* 0.04 Units/min (01-28-18 1611)    Assessment: 67 yof cc leg swelling and abdominal distention with PMH COPD, DM, DVT in pregnancy, diverticula, pneumonia. Now US showing b/l DVT. Pharmacy consulted to dose heparin for DVT. NO PTA OAC noted.   Goal of Therapy:  Heparin level 0.3-0.7 units/ml Monitor platelets by anticoagulation protocol: Yes   Plan:  Patient off heparin due to laparotomy last night. Per surgery,  needs to be off heparin for 24 hours post-surgery. Heparin has been off since 11/3 @ 1251. Surgery was between 2300 11/3 and 0200 11/4.Per surgery, will resume at the 24 hour mark of 550 units/hr @ 0200 on 11/6 with no bolus. Will order heparin level for 11/6 @ 0800.  Pharmacy will continue to monitor.   Mauri Reading, PharmD Pharmacy Resident  Jan 19, 2018 4:38 PM

## 2018-01-29 NOTE — Consult Note (Signed)
Pharmacy Electrolyte Monitoring Consult:  Pharmacy consulted to assist in monitoring and replacing electrolytes in this 67 y.o. female admitted on 01/18/2018 with Leg Swelling and abdominal distention   Labs:  Sodium (mmol/L)  Date Value  01/04/2018 140  02/11/2013 139   Potassium (mmol/L)  Date Value  01/26/2018 4.1  02/11/2013 3.9   Magnesium (mg/dL)  Date Value  16/11/9602 2.2   Phosphorus (mg/dL)  Date Value  54/10/8117 3.3   Calcium (mg/dL)  Date Value  14/78/2956 6.5 (L)   Calcium, Total (mg/dL)  Date Value  21/30/8657 9.5   Albumin (g/dL)  Date Value  84/69/6295 2.0 (L)  02/11/2013 4.0    Assessment/Plan: Goals: Potassium ~4.0, Magnesium ~2.0  Electrolyte replacement not warranted at this time.  Will order electrolytes with AM labs.  Glucose: Patient is diabetic. BG for last 24 hours 105-147. Patient has moderate SSI ordered. Also receiving hydrocortisone 100 mg IV q8h.  Constipation: Last BM 11/4. Patient has prn dulcolax 10mg  suppository, and senokot solution 5 mL VT BID.  Pharmacy will continue to monitor.  Mauri Reading, PharmD Pharmacy Resident  01/05/2018 4:55 PM

## 2018-01-29 NOTE — Consult Note (Signed)
WOC Nurse ostomy consult note Consult received. Patient critically ill. Will not see today, will follow and plan visit tomorrow.  WOC nursing team will follow, and will remain available to this patient, as well as the nursing, surgical and medical teams. Thanks, Ladona Mow, MSN, RN, GNP, [place]  Pager# [phone]

## 2018-01-29 NOTE — Procedures (Signed)
Central Venous Catheter Insertion Procedure Note Yvette Martinez [id number] 10-03-1950  Procedue: Insertion of Central Venous Catheter Indications: Assessment of intravascular volume, Drug and/or fluid administration and Frequent blood sampling  Procedure Details Consent: Risks of procedure as well as the alternatives and risks of each were explained to the (patient/caregiver).  Consent for procedure obtained. Time Out: Verified patient identification, verified procedure, site/side was marked, verified correct patient position, special equipment/implants available, medications/allergies/relevent history reviewed, required imaging and test results available.  Performed  Maximum sterile technique was used including antiseptics, cap, gloves, gown, hand hygiene, mask and sheet. Skin prep: Chlorhexidine; local anesthetic administered A antimicrobial bonded/coated single lumen catheter was placed in the left internal jugular vein using the Seldinger technique.  Evaluation Blood flow good Complications: No apparent complications Patient did tolerate procedure well. Chest X-ray ordered to verify placement.  CXR: normal. Procedure performed under direct supervision of Dr.mAX. Ultrasound utilized for realtime vessel cannulation  Yvette Martinez S. [facility] ANP-BC Pulmonary and Critical Care Medicine [facility] Pager [phone] or [phone]  NB: This document was prepared using Dragon voice recognition software and may include unintentional dictation errors.   01/12/2018, 6:05 AM

## 2018-01-29 NOTE — Progress Notes (Signed)
Pt Extubated to Room air  Per Sonda Rumble NP

## 2018-01-29 NOTE — Progress Notes (Signed)
[facility] - [place] at [facility]   PATIENT NAME: Yvette Martinez    MR#:  [id number]  DATE OF BIRTH:  08/25/1950  SUBJECTIVE:  CHIEF COMPLAINT:   Chief Complaint  Patient presents with  . Leg Swelling  . abdominal distention  Loosing weight and constipated for last few weeks. Noted severely hypokalemia and swelling both legs.  Worsening on Distention, taken for surgery- s/p exploratory lapratomy, left colon resection and colostomy. Severe shock post surgery also noted to have leg ischemia on left leg post op.  REVIEW OF SYSTEMS:  Critically ill,  Can not give ROS.  ROS  DRUG ALLERGIES:   Allergies  Allergen Reactions  . Penicillins Rash and Other (See Comments)    Has patient had a PCN reaction causing immediate rash, facial/tongue/throat swelling, SOB or lightheadedness with hypotension: Unknown Has patient had a PCN reaction causing severe rash involving mucus membranes or skin necrosis: Unknown Has patient had a PCN reaction that required hospitalization: No Has patient had a PCN reaction occurring within the last 10 years: No If all of the above answers are "NO", then may proceed with Cephalosporin use.   . Venlafaxine Swelling  . Sulfa Antibiotics Hives    VITALS:  Blood pressure (!) 83/58, pulse (!) 125, temperature 99.1 F (37.3 C), resp. rate 18, height 5\' 2"  (1.575 m), weight 62.5 kg, SpO2 98 %.  PHYSICAL EXAMINATION:  GENERAL:  67 y.o.-year-old severe malnourished patient lying in the bed , critically ill. EYES: Pupils equal, round, reactive to light , No scleral icterus.  HEENT: Head atraumatic, normocephalic. Oropharynx and nasopharynx clear.  NECK:  Supple, no jugular venous distention. No thyroid enlargement, no tenderness.  LUNGS: Normal breath sounds bilaterally, no wheezing, rales,rhonchi or crepitation. No use of accessory muscles of respiration. On vent support. CARDIOVASCULAR: S1, S2 normal. No murmurs, rubs, or gallops.  ABDOMEN:  Soft, s/p surgery dressing present. EXTREMITIES: b/l pedal edema, left leg cold.  NEUROLOGIC: sedated on vent support.  PSYCHIATRIC: The patient is comatose/ sedated/ critically ill.  SKIN: No obvious rash, lesion, or ulcer.   Physical Exam LABORATORY PANEL:   CBC Recent Labs  Lab 01/11/2018 0409  WBC 3.4*  HGB 15.7*  HCT 45.6  PLT 156   ------------------------------------------------------------------------------------------------------------------  Chemistries  Recent Labs  Lab 01/12/2018 0409 01/23/2018 0552  NA 140  --   K 4.1  --   CL 108  --   CO2 25  --   GLUCOSE 147*  --   BUN 17  --   CREATININE 0.54  --   CALCIUM 6.5*  --   MG 1.6* 2.2  AST 31  --   ALT 17  --   ALKPHOS 64  --   BILITOT 1.9*  --    ------------------------------------------------------------------------------------------------------------------  Cardiac Enzymes Recent Labs  Lab 01/20/2018 0858 01/26/2018 1515  TROPONINI <0.03 0.05*   ------------------------------------------------------------------------------------------------------------------  RADIOLOGY:  Dg Abd 1 View  Result Date: 01/23/2018 CLINICAL DATA:  Bowel obstruction.  Abdominal pain and discomfort. EXAM: ABDOMEN - 1 VIEW COMPARISON:  CT of the abdomen and pelvis 2018-01-14 FINDINGS: Marked dilation of the colon is again noted. There is no significant interval change. A J-tube is coiled in the stomach. No free air is present. IMPRESSION: Stable distal colonic obstruction. Electronically Signed   By: Marin Roberts M.D.   On: 01/11/2018 10:55   Ct Angio Ao+bifem W & Or Wo Contrast  Result Date: 01/22/2018 CLINICAL DATA:  Left leg cold and painful with sudden  onset. EXAM: CT ANGIOGRAPHY OF ABDOMINAL AORTA WITH ILIOFEMORAL RUNOFF TECHNIQUE: Multidetector CT imaging of the abdomen, pelvis and lower extremities was performed using the standard protocol during bolus administration of intravenous contrast. Multiplanar CT image  reconstructions and MIPs were obtained to evaluate the vascular anatomy. CONTRAST:  ISOVUE-370 IOPAMIDOL (ISOVUE-370) INJECTION 76% COMPARISON:  CT abdomen and pelvis 01/08/2018 FINDINGS: VASCULAR Aorta: Normal caliber. No aneurysm, dissection, or occlusion. Aortic calcifications. Celiac: Celiac axis is somewhat diminutive with calcification at the origin. Appears patent. Likely proximal stenosis. SMA: Patent without evidence of aneurysm, dissection, vasculitis or significant stenosis. Renals: Both renal arteries are patent without evidence of aneurysm, dissection, vasculitis, fibromuscular dysplasia or significant stenosis. IMA: Patent without evidence of aneurysm, dissection, vasculitis or significant stenosis. RIGHT Lower Extremity Inflow: Common, internal, and external iliac arteries are patent. No aneurysm or occlusion. Outflow: Common, superficial and profunda femoral arteries and the popliteal artery are patent without evidence of aneurysm, dissection, vasculitis or significant stenosis. Runoff: Patent three vessel runoff to the ankle. LEFT Lower Extremity Inflow: Left common iliac artery is patent. There is occlusion or thrombosis of the left external iliac artery just past its origin. No flow is demonstrated throughout most of the left external iliac artery. There is reconstitution at the origin of the left common femoral artery. Outflow: Common, superficial and profunda femoral arteries and the popliteal artery are patent without evidence of aneurysm, dissection, vasculitis or significant stenosis. Runoff: Minimal flow demonstrated in the popliteal artery, and tibial trunk. No flow demonstrated in the trifurcation vessels. This is likely due to decreased inflow. Veins: Inferior vena caval filter. No obvious venous abnormality within the limitations of this arterial phase study. Review of the MIP images confirms the above findings. NON-VASCULAR Lower chest: Atelectasis in the lung bases. Minimal  bilateral pleural effusions. Hepatobiliary: Incomplete visualization of the liver due to field of view. No definite focal lesions. Gallbladder and bile ducts are poorly visualized. Pancreas: Appears grossly unremarkable. Peripancreatic edema is likely related to a more diffuse process rather than pancreatitis. Spleen: Normal in size without focal abnormality. Adrenals/Urinary Tract: Adrenal glands are unremarkable. Kidneys are normal, without renal calculi, focal lesion, or hydronephrosis. Bladder is decompressed with a Foley catheter in place. Gas in the bladder consistent with catheter insertion. Residual contrast material in the bladder likely arising from a previous contrast administration as no contrast is currently demonstrated in the renal collecting system or ureters . Stomach/Bowel: Enteric tube tip in the body of the stomach. Fluid-filled stomach without significant wall thickening. Small bowel and colon are mainly decompressed. There is evidence of distal small bowel wall thickening without pneumatosis. Diffuse colonic wall thickening with pericolonic edema and fold thickening. Left lower quadrant descending colostomy. Lymphatic: Scattered lymph nodes are not pathologically enlarged. Reproductive: Status post hysterectomy. No adnexal masses. Other: Free air and free fluid in the abdomen, likely postoperative. Soft tissue gas is present in the anterior abdominal wall, also likely postoperative. Surgical drain in the pelvis. Skin clips along the anterior abdominal wall. Bilateral lower extremity edema. Musculoskeletal: No destructive bone lesions. 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 Minimal flow demonstrated in the left popliteal artery and runoff vessels likely due to decreased inflow. NON-VASCULAR 1. Diffuse colonic wall thickening with pericolonic edema and fold thickening and wall  thickening of the distal small bowel consistent with enterocolitis. Free fluid and free air in the abdomen and pelvis, likely postoperative. 2.  Atelectasis in the lung bases. Minimal bilateral pleural effusions. These results were called by telephone at the time of interpretation on 2018/01/27 at 2:32 am to ICU nurse [facility] , who verbally acknowledged these results. Electronically Signed   By: Burman Nieves M.D.   On: 2018-01-27 02:36   Dg Chest Port 1 View  Result Date: 2018/01/27 CLINICAL DATA:  Line placements EXAM: PORTABLE CHEST 1 VIEW COMPARISON:  01/09/2018 FINDINGS: Endotracheal tube tip measures 3.7 cm above the carina. Enteric tube tip is off the field of view but below the left hemidiaphragm. A left central venous catheter has been placed with tip over the cavoatrial junction region. No pneumothorax. Heart size and pulmonary vascularity are normal. Emphysematous changes in the lungs. No airspace disease or consolidation. No blunting of costophrenic angles. Calcification of the aorta. IMPRESSION: Appliances appear in satisfactory location. Emphysematous changes in the lungs. No evidence of active pulmonary disease. Electronically Signed   By: Burman Nieves M.D.   On: 01/27/2018 05:04   Portable Chest X-ray  Result Date: 01/06/2018 CLINICAL DATA:  History of COPD and DVT.  ETT. EXAM: PORTABLE CHEST 1 VIEW COMPARISON:  None. FINDINGS: The ETT is in good position. The NG tube terminates below today's film. The heart, hila, mediastinum, lungs, and pleura are otherwise unchanged and unremarkable. IMPRESSION: Support apparatus as above.  No acute abnormalities. Electronically Signed   By: Gerome Sam III M.D   On: 01/13/2018 23:43    ASSESSMENT AND PLAN:   Principal Problem:   Bowel obstruction (HCC) Active Problems:   Hypokalemia   COPD (chronic obstructive pulmonary disease) (HCC)   Anxiety and depression   Hypomagnesemia   Abdominal distention   Acute deep vein thrombosis (DVT) of  femoral vein of both lower extremities (HCC)   Pulmonary embolus (HCC)  * Bowel obstruction (HCC) -General surgery consulted   NG tube suction.   Also getting GI consult to have decompression colonoscopy.   GI suggested no colonoscopy this time, due to worsening on distension- s/p hemicolectomy done with colostomy- see the surgery notes.  * severe septic shock   Manage per ICU team , on pressors and vent support.  * Hypotension- as above.  * left LL ischemia   vascular surgery on case  *  Hypokalemia -significant hypokalemia, we will replace this and monitor   Improved.  * b/l DVT LL    Started on heparine drip.    Checked for PE by CT angio. Positive.   Called oncology consult as New DVT and there seems to have some mass in colon.   S/p IVC filter.  *  COPD (chronic obstructive pulmonary disease) (HCC) -home dose medications, now on vent support.  *  Hypomagnesemia -replace and monitor  *  Anxiety and depression -home dose anxiolytic and antidepressant  * severe malnutrition  Once obstruction resolved, will need dietary consult.  All the records are reviewed and case discussed with Care Management/Social Workerr. Management plans discussed with the patient, family and they are in agreement.  CODE STATUS: Full.  TOTAL TIME TAKING CARE OF THIS PATIENT: 35 minutes.    POSSIBLE D/C IN 1-2 DAYS, DEPENDING ON CLINICAL CONDITION.   Altamese Dilling M.D on 01/03/2018   Between 7am to 6pm - Pager - [phone]  After 6pm go to [email or web address] - password [email or web address]  [facility]  Office  [phone]  CC: Primary care physician; Care, [facility]  Note: This dictation was prepared with Dragon dictation along with smaller phrase technology.  Any transcriptional errors that result from this process are unintentional.

## 2018-01-29 DEATH — deceased

## 2019-05-03 IMAGING — DX DG CHEST 1V PORT
1 series · 1 of 1 positions shown · non-contrast
Comparison: 01/01/2018

CLINICAL DATA: Line placements

EXAM:
PORTABLE CHEST 1 VIEW

[chest ap]
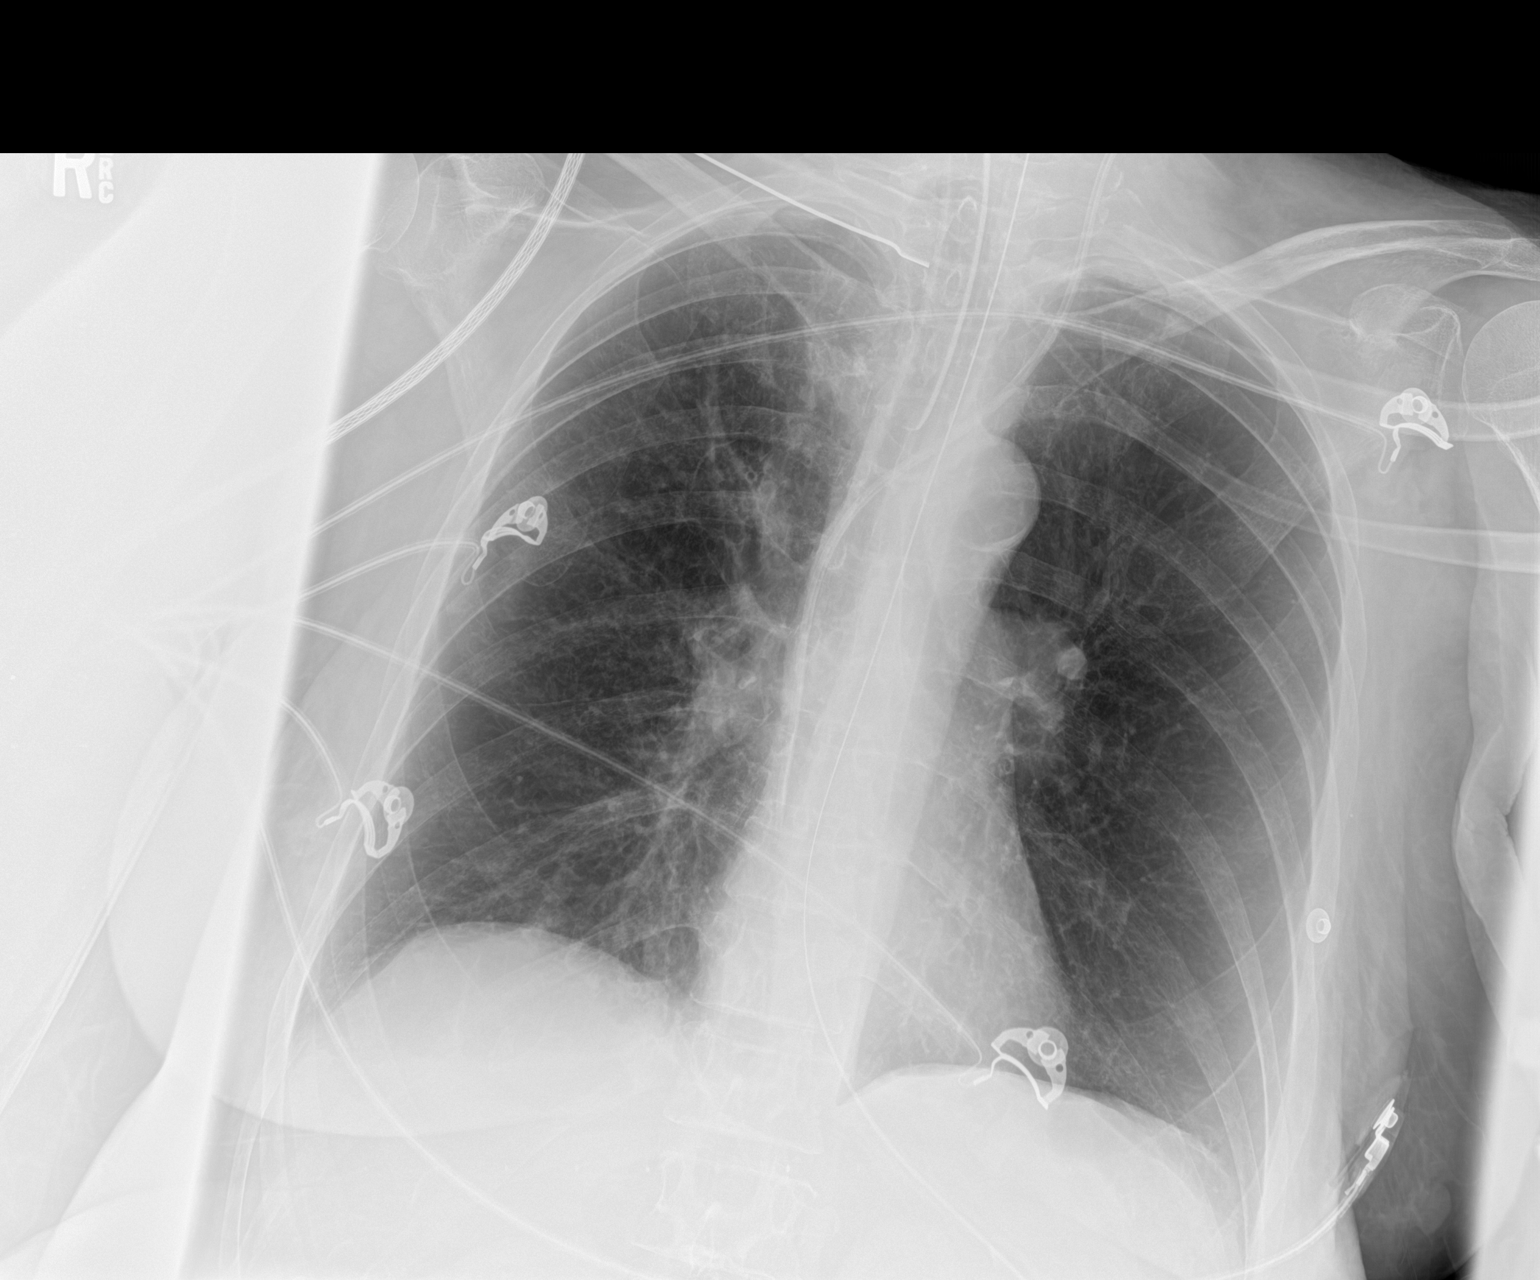

[1 of 1 positions shown; findings below may reference images not displayed]

FINDINGS: Endotracheal tube tip measures 3.7 cm above the carina. Enteric tube
tip is off the field of view but below the left hemidiaphragm. A
left central venous catheter has been placed with tip over the
cavoatrial junction region. No pneumothorax. Heart size and
pulmonary vascularity are normal. Emphysematous changes in the
lungs. No airspace disease or consolidation. No blunting of
costophrenic angles. Calcification of the aorta.
IMPRESSION: Appliances appear in satisfactory location. Emphysematous changes in
the lungs. No evidence of active pulmonary disease.
